# Patient Record
Sex: Female | Born: 1950
Health system: Southern US, Community
[De-identification: ages and names within clinical notes are randomized; demographics above are authoritative.]

## PROBLEM LIST (undated history)

## (undated) DIAGNOSIS — Z860101 Personal history of adenomatous and serrated colon polyps: Secondary | ICD-10-CM

## (undated) DIAGNOSIS — I10 Essential (primary) hypertension: Secondary | ICD-10-CM

## (undated) DIAGNOSIS — Z8669 Personal history of other diseases of the nervous system and sense organs: Secondary | ICD-10-CM

## (undated) DIAGNOSIS — N393 Stress incontinence (female) (male): Secondary | ICD-10-CM

## (undated) DIAGNOSIS — H04123 Dry eye syndrome of bilateral lacrimal glands: Secondary | ICD-10-CM

## (undated) DIAGNOSIS — Z0282 Encounter for adoption services: Secondary | ICD-10-CM

## (undated) DIAGNOSIS — M1990 Unspecified osteoarthritis, unspecified site: Secondary | ICD-10-CM

## (undated) DIAGNOSIS — E782 Mixed hyperlipidemia: Secondary | ICD-10-CM

## (undated) DIAGNOSIS — Z86010 Personal history of colonic polyps: Secondary | ICD-10-CM

## (undated) DIAGNOSIS — E739 Lactose intolerance, unspecified: Secondary | ICD-10-CM

## (undated) DIAGNOSIS — M545 Low back pain, unspecified: Secondary | ICD-10-CM

## (undated) DIAGNOSIS — Z789 Other specified health status: Secondary | ICD-10-CM

## (undated) DIAGNOSIS — I70213 Atherosclerosis of native arteries of extremities with intermittent claudication, bilateral legs: Secondary | ICD-10-CM

## (undated) DIAGNOSIS — R600 Localized edema: Secondary | ICD-10-CM

## (undated) DIAGNOSIS — D259 Leiomyoma of uterus, unspecified: Secondary | ICD-10-CM

## (undated) DIAGNOSIS — G4733 Obstructive sleep apnea (adult) (pediatric): Secondary | ICD-10-CM

## (undated) DIAGNOSIS — Z9989 Dependence on other enabling machines and devices: Secondary | ICD-10-CM

## (undated) DIAGNOSIS — N950 Postmenopausal bleeding: Secondary | ICD-10-CM

## (undated) DIAGNOSIS — E119 Type 2 diabetes mellitus without complications: Secondary | ICD-10-CM

## (undated) HISTORY — DX: Obstructive sleep apnea (adult) (pediatric): G47.33

## (undated) HISTORY — PX: BREAST EXCISIONAL BIOPSY: SUR124

## (undated) HISTORY — DX: Dependence on other enabling machines and devices: Z99.89

## (undated) HISTORY — PX: DERMOID CYST  EXCISION: SHX1452

## (undated) HISTORY — PX: EYE SURGERY: SHX253

## (undated) HISTORY — PX: ANKLE ARTHROSCOPY: SUR85

---

## 1991-04-24 DIAGNOSIS — Z8679 Personal history of other diseases of the circulatory system: Secondary | ICD-10-CM

## 1991-04-24 DIAGNOSIS — I341 Nonrheumatic mitral (valve) prolapse: Secondary | ICD-10-CM

## 1991-04-24 DIAGNOSIS — G4733 Obstructive sleep apnea (adult) (pediatric): Secondary | ICD-10-CM

## 1991-04-24 HISTORY — DX: Personal history of other diseases of the circulatory system: Z86.79

## 1991-04-24 HISTORY — DX: Obstructive sleep apnea (adult) (pediatric): G47.33

## 1991-04-24 HISTORY — DX: Nonrheumatic mitral (valve) prolapse: I34.1

## 1998-07-27 ENCOUNTER — Other Ambulatory Visit: Admission: RE | Admit: 1998-07-27 | Discharge: 1998-07-27 | Payer: Self-pay | Admitting: Obstetrics & Gynecology

## 1999-05-03 ENCOUNTER — Other Ambulatory Visit: Admission: RE | Admit: 1999-05-03 | Discharge: 1999-05-03 | Payer: Self-pay | Admitting: Obstetrics & Gynecology

## 1999-05-09 ENCOUNTER — Ambulatory Visit (HOSPITAL_COMMUNITY): Admission: RE | Admit: 1999-05-09 | Discharge: 1999-05-09 | Payer: Self-pay | Admitting: Obstetrics & Gynecology

## 2000-02-29 ENCOUNTER — Ambulatory Visit (HOSPITAL_BASED_OUTPATIENT_CLINIC_OR_DEPARTMENT_OTHER): Admission: RE | Admit: 2000-02-29 | Discharge: 2000-02-29 | Payer: Self-pay | Admitting: Family Medicine

## 2000-05-21 ENCOUNTER — Other Ambulatory Visit: Admission: RE | Admit: 2000-05-21 | Discharge: 2000-05-21 | Payer: Self-pay | Admitting: Obstetrics & Gynecology

## 2001-06-12 ENCOUNTER — Other Ambulatory Visit: Admission: RE | Admit: 2001-06-12 | Discharge: 2001-06-12 | Payer: Self-pay | Admitting: Obstetrics & Gynecology

## 2002-07-07 ENCOUNTER — Other Ambulatory Visit: Admission: RE | Admit: 2002-07-07 | Discharge: 2002-07-07 | Payer: Self-pay | Admitting: Obstetrics & Gynecology

## 2003-04-24 HISTORY — PX: KNEE ARTHROSCOPY: SUR90

## 2003-08-06 ENCOUNTER — Other Ambulatory Visit: Admission: RE | Admit: 2003-08-06 | Discharge: 2003-08-06 | Payer: Self-pay | Admitting: Obstetrics & Gynecology

## 2004-09-04 ENCOUNTER — Other Ambulatory Visit: Admission: RE | Admit: 2004-09-04 | Discharge: 2004-09-04 | Payer: Self-pay | Admitting: Obstetrics & Gynecology

## 2005-02-14 ENCOUNTER — Emergency Department (HOSPITAL_COMMUNITY): Admission: EM | Admit: 2005-02-14 | Discharge: 2005-02-14 | Payer: Self-pay | Admitting: Emergency Medicine

## 2011-07-25 ENCOUNTER — Ambulatory Visit: Payer: 59

## 2011-07-25 ENCOUNTER — Ambulatory Visit: Payer: 59 | Admitting: Family Medicine

## 2011-07-25 ENCOUNTER — Encounter: Payer: Self-pay | Admitting: Physician Assistant

## 2011-07-25 VITALS — BP 116/76 | HR 84 | Temp 98.4°F | Resp 16 | Ht 63.5 in | Wt 171.0 lb

## 2011-07-25 DIAGNOSIS — M25559 Pain in unspecified hip: Secondary | ICD-10-CM

## 2011-07-25 DIAGNOSIS — M461 Sacroiliitis, not elsewhere classified: Secondary | ICD-10-CM

## 2011-07-25 DIAGNOSIS — J029 Acute pharyngitis, unspecified: Secondary | ICD-10-CM

## 2011-07-25 LAB — POCT RAPID STREP A (OFFICE): Rapid Strep A Screen: NEGATIVE

## 2011-07-25 MED ORDER — PREDNISONE 20 MG PO TABS: ORAL_TABLET | ORAL | Status: AC

## 2011-07-25 NOTE — Progress Notes (Signed)
  Subjective:    Patient ID: Brittney Williamson, female    DOB: 11-02-1950, 61 y.o.   MRN: [id number]  HPI    Review of Systems     Objective:   Physical Exam        Assessment & Plan:  xr reviewed - report pending.  Agree with plan per Eula Listen, PA-C.

## 2011-07-25 NOTE — Progress Notes (Signed)
Patient ID: Brittney Williamson MRN: [id number], DOB: 13-Feb-1951, 61 y.o. Date of Encounter: 07/25/2011, 8:28 PM  Primary Physician: No primary provider on file.  Chief Complaint:  Chief Complaint  Patient presents with  . Sore Throat    also scratchy has taken sudafed pe otc    HPI: 61 y.o. year old female presents with a 1 day history of sore throat. Subjective fever and chills. No cough, congestion, rhinorrhea, sinus pressure, otalgia, or headache. Normal hearing. No GI complaints. Able to swallow saliva, but hurts to do so. Decreased appetite secondary to sore throat. Missed school today and needs a note for school. "I get strep easy."  She also mention while she is here that she would like to have her left hip looked at. She states that in January she was helping her partner's mother mom nursing homes and while she was pushing some furniture she felt a pop. Since that time she has had off and on mild pain along the left upper buttock. This pain does not radiate and is not constant. It is not sharp, just annoying. She has not seen anyone for this previously. Of note, she does regularly take Aleve for her right knee, but it does not seem to help her with her left lower back pain. She has not had any loss of bowel or bladder function. She is able to walk without difficulty, but does state that going up stairs is difficult, along with rotation of her hip causes some discomfort. She denies any numbness or tingling. No urinary or vaginal symptoms, or history of kidney stones.  No past medical history on file.   Home Meds: Prior to Admission medications   Medication Sig Start Date End Date Taking? Authorizing Provider  Cholecalciferol (VITAMIN D) 1000 UNITS capsule Take 1,000 Units by mouth daily.   Yes Historical Provider, MD  Multiple Vitamin (MULTIVITAMIN) tablet Take 1 tablet by mouth daily.   Yes Historical Provider, MD  Red Yeast Rice Extract (RED YEAST RICE PO) Take by mouth.   Yes  Historical Provider, MD    Allergies:  Allergies  Allergen Reactions  . Trimox (Amoxicillin Trihydrate) Itching    History   Social History  . Marital Status: Single    Spouse Name: Brittney Williamson    Number of Children: Brittney Williamson  . Years of Education: Brittney Williamson   Occupational History  . Not on file.   Social History Main Topics  . Smoking status: Never Smoker   . Smokeless tobacco: Not on file  . Alcohol Use: Not on file  . Drug Use: Not on file  . Sexually Active: Not on file   Other Topics Concern  . Not on file   Social History Narrative  . No narrative on file     Review of Systems: Constitutional: negative for chills, fever, night sweats or weight changes HEENT: see above Cardiovascular: negative for chest pain or palpitations Respiratory: negative for hemoptysis, wheezing, or shortness of breath Abdominal: negative for abdominal pain, nausea, vomiting or diarrhea Dermatological: negative for rash Neurologic: negative for headache   Physical Exam: Blood pressure 116/76, pulse 84, temperature 98.4 F (36.9 C), temperature source Oral, resp. rate 16, height 5' 3.5" (1.613 m), weight 171 lb (77.565 kg)., Body mass index is 29.82 kg/(m^2). General: Well developed, well nourished, in no acute distress. Head: Normocephalic, atraumatic, eyes without discharge, sclera non-icteric, nares are patent. Bilateral auditory canals clear, TM's are without perforation, pearly grey with reflective cone of light bilaterally. No  sinus TTP. Oral cavity moist, dentition normal. Posterior pharynx with post nasal drip and mild erythema. No peritonsillar abscess or tonsillar exudate. Neck: Supple. No thyromegaly. Full ROM. No lymphadenopathy. Lungs: Clear bilaterally to auscultation without wheezes, rales, or rhonchi. Breathing is unlabored. Heart: RRR with S1 S2. No murmurs, rubs, or gallops appreciated. Msk:  Strength and tone normal for age. Left lower back mildly TTP along upper buttock. FROM of left  leg. Does have some discomfort with internal and external rotation of the left hip. SLR negative both seated and supine. DTR 2+ bilaterally. No midline TTP.  Extremities: No clubbing or cyanosis. No edema. Neuro: Alert and oriented X 3. Moves all extremities spontaneously. CNII-XII grossly in tact. Psych:  Responds to questions appropriately with a normal affect.   Labs: Results for orders placed in visit on 07/25/11  POCT RAPID STREP A (OFFICE)      Component Value Range   Rapid Strep A Screen Negative  Negative    TC pending  [facility] reading (PRIMARY) by  Dr. Neva Seat. Slight degenerative changes bilateral femoral acetabular joints Slight degenerative changes left great than right SI joint   ASSESSMENT AND PLAN:  61 y.o. year old female with pharyngitis and sacroiliitis 1. Pharyngitis -See below for Prednisone dosing -Await TC results -Tylenol/Motrin prn -Rest/fluids -RTC precautions -RTC 3-5 days if no improvement  2. Sacroiliitis  -Prednisone 20 mg #12 3x2, 2x2, 1x2 no RF -Await over read -Call if no better in 2 weeks, consider ortho referral vs MRI -Rest  Signed, Eula Listen, PA-C 07/25/2011 8:28 PM

## 2011-07-27 LAB — CULTURE, GROUP A STREP: Organism ID, Bacteria: NORMAL

## 2011-07-27 NOTE — Progress Notes (Signed)
Quick Note:  Await final results.  Eula Listen, PA-C 07/27/2011 10:57 AM ______

## 2011-08-08 ENCOUNTER — Telehealth: Payer: Self-pay

## 2011-08-08 NOTE — Telephone Encounter (Signed)
LMOM to cb

## 2011-08-08 NOTE — Telephone Encounter (Signed)
Ok, please draft note and forward to patient.  Brittney Williamson

## 2011-08-08 NOTE — Telephone Encounter (Signed)
Pt states that her teacher is requiring her to have a note for missing class on Monday August 06, 2011 due to allergies and a headache, pt would like to know if we are able write this note for her by 2:00pm today.

## 2011-08-10 NOTE — Telephone Encounter (Signed)
Spoke with patient and let her know that letter would be at front desk.

## 2011-12-13 ENCOUNTER — Telehealth: Payer: Self-pay

## 2011-12-13 DIAGNOSIS — G4730 Sleep apnea, unspecified: Secondary | ICD-10-CM

## 2011-12-13 NOTE — Telephone Encounter (Signed)
I have called her back and she wants to repeat her sleep study, has been some time since it was done/ I am pulling chart. She also wants old information from previous sleep study faxed to [facility] I told her I will fax this. The # [phone]

## 2011-12-13 NOTE — Telephone Encounter (Signed)
PT STATES SHE NEED DR DAUB TO WRITE A PRESCRIPTION FOR HER SUPPLIES/PRODUCTS THAT GOES TO [facility], ALSO NEED TO HAVE THE LAST READING OF HER C-PAP MACHINE SENT TO [facility] FROM [facility] IN [place] YOU MAY REACH PT AT [phone]

## 2011-12-14 NOTE — Telephone Encounter (Signed)
I have put order in for her supplies. Please sign, also she wants new sleep study, has been several years. Please advise. Chart is in your box.

## 2011-12-16 NOTE — Telephone Encounter (Signed)
Please put in an order for e sleep study split protocol with Dr. Vickey Huger

## 2011-12-17 ENCOUNTER — Telehealth: Payer: Self-pay

## 2011-12-17 NOTE — Telephone Encounter (Signed)
Pt states she asked recently for [place] to fax multiple things to [facility] for her cpap needs. States sophie from [facility] states they did not receive anything. They need an rx for sleep study and for various pieces of equipment with her cpap machine. Also most recent report of cpap readings (pt states from a few years ago).   Fax for [facility]: [phone] attn: sophie  Call pt with questions

## 2011-12-17 NOTE — Telephone Encounter (Signed)
Order made

## 2011-12-17 NOTE — Telephone Encounter (Signed)
Has signed all the forms I can see. I sent them back to the team leader.

## 2011-12-17 NOTE — Telephone Encounter (Signed)
Forms faxed to [facility], along with sleep study from 2007.  Per prior message, new study ordered.

## 2011-12-17 NOTE — Telephone Encounter (Signed)
Dr. Cleta Alberts, did you sign this for patient?  I ordered the sleep study.Marland KitchenMarland Kitchen

## 2012-03-06 ENCOUNTER — Telehealth: Payer: Self-pay

## 2012-03-06 NOTE — Telephone Encounter (Signed)
I do not have the most recent sleep study. Called patient. Left message for her to call back about this.

## 2012-03-06 NOTE — Telephone Encounter (Signed)
It was done Nov. 4th. She is not sure where it was done. She will call me back and let me know so I can try to get results.

## 2012-03-06 NOTE — Telephone Encounter (Signed)
Patient request results from sleep study to be faxed to [facility] at [phone]. Call patient when done at #[phone]

## 2012-03-07 ENCOUNTER — Telehealth: Payer: Self-pay

## 2012-03-07 NOTE — Telephone Encounter (Signed)
PATIENT CALLED WANTING TO SPEAK TO AMY ABOUT SLEEP STUDY TEST AND RESULTS. PLEASE CALL BACK IMMEDIATELY TO CLARIFY. THANK YOU!

## 2012-03-07 NOTE — Telephone Encounter (Signed)
This was done at [facility] sleep study lab. The phone # [phone]. Needs titration faxed to me so I can send to the [facility] for supplies.

## 2012-03-07 NOTE — Telephone Encounter (Signed)
I called [facility] and the report is not in yet. The sleep center advised they will send the titration to [facility]. I called patient and she is advised.

## 2012-09-23 ENCOUNTER — Ambulatory Visit (INDEPENDENT_AMBULATORY_CARE_PROVIDER_SITE_OTHER): Payer: BC Managed Care – PPO | Admitting: Family Medicine

## 2012-09-23 VITALS — BP 130/72 | HR 73 | Temp 98.2°F | Resp 16 | Ht 63.5 in | Wt 158.0 lb

## 2012-09-23 DIAGNOSIS — L089 Local infection of the skin and subcutaneous tissue, unspecified: Secondary | ICD-10-CM

## 2012-09-23 MED ORDER — DOXYCYCLINE HYCLATE 100 MG PO CAPS: 100.0000 mg | ORAL_CAPSULE | Freq: Two times a day (BID) | ORAL | Status: DC

## 2012-09-23 MED ORDER — ACYCLOVIR 400 MG PO TABS: 400.0000 mg | ORAL_TABLET | Freq: Three times a day (TID) | ORAL | Status: DC

## 2012-09-23 NOTE — Progress Notes (Signed)
Chief complaint: Possible shingles  History of present illness: Patient is a 62 year old female coming in with a outbreak on her bottom of the last 3-5 days. Patient states it is starting one area and seems to progress somewhat. Patient states that it is minimally tender and is having some discharge. Patient denies any fevers or chills or any but by recently. Patient has been able to do all her activities of daily living. Patient denies that it is significantly sore to touch. Patient denies any abnormal weight loss or any history of cancers.  No past medical history on file. No past surgical history on file. History  Substance Use Topics  . Smoking status: Never Smoker   . Smokeless tobacco: Not on file  . Alcohol Use: Not on file   No family history on file. History   Social History  . Marital Status: Single    Spouse Name: N/A    Number of Children: N/A  . Years of Education: N/A   Social History Main Topics  . Smoking status: Never Smoker   . Smokeless tobacco: None  . Alcohol Use: None  . Drug Use: None  . Sexually Active: None   Other Topics Concern  . None   Social History Narrative  . None   Physical exam Blood pressure 130/72, pulse 73, temperature 98.2 F (36.8 C), resp. rate 16, height 5' 3.5" (1.613 m), weight 158 lb (71.668 kg), SpO2 100.00%. General: No apparent distress alert and oriented x3 Cardiovascular: Regular rate and rhythm no murmur Pulmonary: Clear to auscultation bilaterally Skin exam: On patient's left bottom she does have a coalition of multiple vesicles numbering 4 with some mild serosanguineous discharge. His does have some erythema surrounding the area. This is minimally tender to palpation. This is not following a dermatome.  Assessment: Skin infection questionable spider bite my concern for MRSA  Plan: Doxy x 10 days Acyclovir in case it spreads RTC if fever or worsening pain See patient instructions.

## 2012-09-23 NOTE — Patient Instructions (Signed)
Very nice to meet you I think this may be a superficial infection.  I am giving you doxycycline to try for next 10 days.  If it starts to spread start the acyclovir I am giving you.  If you get fever or chills or it gets much worse in pain please come back and may need to get it drained. I do not think it is necessary today.

## 2013-10-29 ENCOUNTER — Telehealth: Payer: Self-pay | Admitting: *Deleted

## 2013-10-29 NOTE — Telephone Encounter (Signed)
Lenora Gomes has requested that we contact Dr. Everlene Farrier for him to consider taking her as a new/old patient.  Per the patient, he has been her family doctor for years, as well as her parents.   Please let us know if we need to schedule her an appt for a CPE.  She can be reached at [phone].     Thank you, Loura Back

## 2013-11-02 NOTE — Telephone Encounter (Signed)
Dr Everlene Farrier is not taking new patients by appointment, she can see him at the urgent care. Called her to advise left message to call back for his hours.

## 2015-04-24 HISTORY — PX: WRIST SURGERY: SHX841

## 2015-06-22 HISTORY — PX: ORIF WRIST FRACTURE: SHX2133

## 2015-08-01 ENCOUNTER — Telehealth: Payer: Self-pay

## 2015-08-01 NOTE — Telephone Encounter (Signed)
Waiting on payment of $31.25 for 51 pages. From [facility]

## 2015-08-15 NOTE — Telephone Encounter (Signed)
Payment received on 08/15/15 and records were faxed.

## 2015-09-07 DIAGNOSIS — Z0271 Encounter for disability determination: Secondary | ICD-10-CM

## 2015-11-17 ENCOUNTER — Ambulatory Visit (INDEPENDENT_AMBULATORY_CARE_PROVIDER_SITE_OTHER): Payer: BLUE CROSS/BLUE SHIELD | Admitting: Physician Assistant

## 2015-11-17 VITALS — BP 142/94 | HR 92 | Temp 98.9°F | Resp 18 | Ht 63.0 in | Wt 169.0 lb

## 2015-11-17 DIAGNOSIS — Z1329 Encounter for screening for other suspected endocrine disorder: Secondary | ICD-10-CM

## 2015-11-17 DIAGNOSIS — M25631 Stiffness of right wrist, not elsewhere classified: Secondary | ICD-10-CM

## 2015-11-17 DIAGNOSIS — Z0000 Encounter for general adult medical examination without abnormal findings: Secondary | ICD-10-CM

## 2015-11-17 DIAGNOSIS — Z9289 Personal history of other medical treatment: Secondary | ICD-10-CM

## 2015-11-17 DIAGNOSIS — Z13220 Encounter for screening for lipoid disorders: Secondary | ICD-10-CM | POA: Diagnosis not present

## 2015-11-17 DIAGNOSIS — S52613D Displaced fracture of unspecified ulna styloid process, subsequent encounter for closed fracture with routine healing: Secondary | ICD-10-CM | POA: Diagnosis not present

## 2015-11-17 DIAGNOSIS — Z13228 Encounter for screening for other metabolic disorders: Secondary | ICD-10-CM | POA: Diagnosis not present

## 2015-11-17 DIAGNOSIS — M25641 Stiffness of right hand, not elsewhere classified: Secondary | ICD-10-CM

## 2015-11-17 DIAGNOSIS — S6291XA Unspecified fracture of right wrist and hand, initial encounter for closed fracture: Secondary | ICD-10-CM

## 2015-11-17 DIAGNOSIS — Z130 Encounter for screening for diseases of the blood and blood-forming organs and certain disorders involving the immune mechanism: Secondary | ICD-10-CM

## 2015-11-17 MED ORDER — PREDNISONE 20 MG PO TABS: ORAL_TABLET | ORAL | 0 refills | Status: DC

## 2015-11-17 MED ORDER — CETIRIZINE HCL 10 MG PO TABS: 10.0000 mg | ORAL_TABLET | Freq: Every day | ORAL | 1 refills | Status: DC

## 2015-11-17 MED ORDER — TRIAMCINOLONE ACETONIDE 0.1 % EX CREA: 1.0000 "application " | TOPICAL_CREAM | Freq: Two times a day (BID) | CUTANEOUS | 0 refills | Status: DC

## 2015-11-17 NOTE — Patient Instructions (Addendum)
IF you received an x-ray today, you will receive an invoice from [facility]. Please contact [facility] at [phone] with questions or concerns regarding your invoice.   IF you received labwork today, you will receive an invoice from [employer]. Please contact [facility] at [phone] with questions or concerns regarding your invoice.   Our billing staff will not be able to assist you with questions regarding bills from these companies.  You will be contacted with the lab results as soon as they are available. The fastest way to get your results is to activate your My Chart account. Instructions are located on the last page of this paperwork. If you have not heard from [place] regarding the results in 2 weeks, please contact this office.    I will contact you with information of the stool DNA with your lab work. Sounds like you are doing the right things.  Please let me know if there is no improvement of your symptoms in the next 5-7 days.  Keeping You Healthy  Get These Tests  Blood Pressure- Have your blood pressure checked by your healthcare provider at least once a year.  Normal blood pressure is 120/80.  Weight- Have your body mass index (BMI) calculated to screen for obesity.  BMI is a measure of body fat based on height and weight.  You can calculate your own BMI at [employer].ch  Cholesterol- Have your cholesterol checked every year.  Diabetes- Have your blood sugar checked every year if you have high blood pressure, high cholesterol, a family history of diabetes or if you are overweight.  Pap Test - Have a pap test every 1 to 5 years if you have been sexually active.  If you are older than 65 and recent pap tests have been normal you may not need additional pap tests.  In addition, if you have had a hysterectomy  for benign disease additional pap tests are not necessary.  Mammogram-Yearly mammograms are essential for  early detection of breast cancer  Screening for Colon Cancer- Colonoscopy starting at age 80. Screening may begin sooner depending on your family history and other health conditions.  Follow up colonoscopy as directed by your Gastroenterologist.  Screening for Osteoporosis- Screening begins at age 32 with bone density scanning, sooner if you are at higher risk for developing Osteoporosis.  Get these medicines  Calcium with Vitamin D- Your body requires 1200-1500 mg of Calcium a day and [phone] IU of Vitamin D a day.  You can only absorb 500 mg of Calcium at a time therefore Calcium must be taken in 2 or 3 separate doses throughout the day.  Hormones- Hormone therapy has been associated with increased risk for certain cancers and heart disease.  Talk to your healthcare provider about if you need relief from menopausal symptoms.  Aspirin- Ask your healthcare provider about taking Aspirin to prevent Heart Disease and Stroke.  Get these Immunizations  Flu shot- Every fall  Pneumonia shot- Once after the age of 25; if you are younger ask your healthcare provider if you need a pneumonia shot.  Tetanus- Every ten years.  Zostavax- Once after the age of 28 to prevent shingles.  Take these steps  Don't smoke- Your healthcare provider can help you quit. For tips on how to quit, ask your healthcare provider or go to www.smokefree.gov or call 1-800 QUIT-NOW.  Be physically active- Exercise 5 days a week for a minimum of 30 minutes.  If you are  not already physically active, start slow and gradually work up to 30 minutes of moderate physical activity.  Try walking, dancing, bike riding, swimming, etc.  Eat a healthy diet- Eat a variety of healthy foods such as fruits, vegetables, whole grains, low fat milk, low fat cheeses, yogurt, lean meats, chicken, fish, eggs, dried beans, tofu, etc.  For more information go to www.thenutritionsource.org  Dental visit- Brush and floss teeth twice daily; visit  your dentist twice a year.  Eye exam- Visit your Optometrist or Ophthalmologist yearly.  Drink alcohol in moderation- Limit alcohol intake to one drink or less a day.  Never drink and drive.  Depression- Your emotional health is as important as your physical health.  If you're feeling down or losing interest in things you normally enjoy, please talk to your healthcare provider.  Seat Belts- can save your life; always wear one  Smoke/Carbon Monoxide detectors- These detectors need to be installed on the appropriate level of your home.  Replace batteries at least once a year.  Violence- If anyone is threatening or hurting you, please tell your healthcare provider.  Living Will/ Health care power of attorney- Discuss with your healthcare provider and family.

## 2015-11-17 NOTE — Progress Notes (Signed)
[facility] [address] [place] [address] [phone]  Date:  11/17/2015   Name:  Brittney Williamson   DOB:  02-Aug-1950   MRN:  [id number]  PCP:  No PCP Per Patient    History of Present Illness: Patient is here today for an annual physical exam, wrist pain, and rash.  Patient has concern of new rash, onset 2 days ago.  Rash is at the left side adjacent to ear.  Very pruritic.  No vision changes.  She was out in yard ripping out weeds just prior to [place].  She has a cat. She tried witch hazel, and placed hydrocortisone topically which helps with it.   Titanium plated wrist following a fracture of wrist 4 months ago.  Continues to have lack of full strength.  She was followed by a physical therapy and hand surgeon in [place] now lives in [place] and needs follow up care.     Diet: everything.  herbalife shakes.  No fried foods.  Eats chicken and fish.  She eats a lot of sweet potatoes.  Salads.  Water intake: 6 bottles per day.  No sodas  BM: normal.  No blood in stool or black stool.    Urination: normal.  No dysuria, hematuria--she has some frequency that has progressively changed chronically.    Sleep: terrible 2 hours with waking, not using cpap.    Social Activity: stainglass, love to go to [place], and camping, travel, art fairs, and concerts.   EtOH:  1-2 per week (glasses) Tobacco or vaping: no  Illicit drug use: no Sexually active: no  There are no active problems to display for this patient.   No past medical history on file.  No past surgical history on file.  Social History  Substance Use Topics  . Smoking status: Never Smoker  . Smokeless tobacco: Not on file  . Alcohol use Not on file    No family history on file.  Allergies  Allergen Reactions  . Trimox [Amoxicillin Trihydrate] Itching    Medication list has been reviewed and updated.  Current Outpatient Prescriptions on File Prior to Visit  Medication Sig Dispense  Refill  . Cholecalciferol (VITAMIN D) 1000 UNITS capsule Take 1,000 Units by mouth daily.    . Multiple Vitamin (MULTIVITAMIN) tablet Take 1 tablet by mouth daily.    . Red Yeast Rice Extract (RED YEAST RICE PO) Take by mouth.    Marland Kitchen UNABLE TO FIND Herbal life shakes    . acyclovir (ZOVIRAX) 400 MG tablet Take 1 tablet (400 mg total) by mouth 3 (three) times daily. (Patient not taking: Reported on 11/17/2015) 15 tablet 0   No current facility-administered medications on file prior to visit.     Review of Systems  Constitutional: Negative for chills and fever.  HENT: Negative for ear discharge, ear pain and sore throat.   Eyes: Negative for blurred vision and double vision.  Respiratory: Negative for cough, shortness of breath and wheezing.   Cardiovascular: Negative for chest pain, palpitations and leg swelling.  Gastrointestinal: Negative for diarrhea, nausea and vomiting.  Genitourinary: Negative for dysuria, frequency and hematuria.  Musculoskeletal: Positive for joint pain (secondary to fall/wrist surgery).  Skin: Positive for rash. Negative for itching.  Neurological: Negative for dizziness and headaches.     Physical Examination: BP (!) 142/94 (BP Location: Left Arm, Patient Position: Sitting, Cuff Size: Normal)   Pulse 92   Temp 98.9 F (37.2 C) (Oral)  Resp 18   Ht 5' 3.5" (1.613 m)   Wt 162 lb (73.5 kg)   SpO2 95%   BMI 28.25 kg/m  Ideal Body Weight: Weight in (lb) to have BMI = 25: 143.1  Physical Exam  Musculoskeletal:       Right wrist: She exhibits decreased range of motion and tenderness. She exhibits no effusion.  Decreased strength 3/5.     Visual Acuity Screening   Right eye Left eye Both eyes  Without correction: 20/20-1 20/13-1 20/13-1  With correction:       Assessment and Plan: Brittney Williamson is a 65 y.o. female who is here today for annual physical exam and wrist pain. Continued treatment of her hand pain is necessary at this time.  Referring  to hand surgeon for follow up, and physical therapy at this time. Pending labs.  Annual physical exam - Plan: CBC, COMPLETE METABOLIC PANEL WITH GFR, Lipid panel, TSH  Screening for deficiency anemia - Plan: CBC  Screening for metabolic disorder - Plan: COMPLETE METABOLIC PANEL WITH GFR  Screening for lipid disorders - Plan: Lipid panel  Screening for thyroid disorder - Plan: TSH  Hand fracture, right, closed, initial encounter - Plan: Ambulatory referral to Hand Surgery, Ambulatory referral to Physical Therapy  Stiffness of right hand joint - Plan: Ambulatory referral to Hand Surgery, Ambulatory referral to Physical Therapy  Stiffness of right wrist joint - Plan: Ambulatory referral to Hand Surgery, Ambulatory referral to Physical Therapy  Ulna styloid fracture, closed, unspecified laterality, with routine healing, subsequent encounter - Plan: Ambulatory referral to Hand Surgery, Ambulatory referral to Physical Therapy  History of EKG - Plan: EKG 12-Lead   Ivar Drape, PA-C [facility] 11/17/2015 5:04 PM

## 2015-11-18 ENCOUNTER — Other Ambulatory Visit (INDEPENDENT_AMBULATORY_CARE_PROVIDER_SITE_OTHER): Payer: BLUE CROSS/BLUE SHIELD | Admitting: Physician Assistant

## 2015-11-18 DIAGNOSIS — Z13220 Encounter for screening for lipoid disorders: Secondary | ICD-10-CM

## 2015-11-18 DIAGNOSIS — Z0000 Encounter for general adult medical examination without abnormal findings: Secondary | ICD-10-CM

## 2015-11-18 DIAGNOSIS — Z130 Encounter for screening for diseases of the blood and blood-forming organs and certain disorders involving the immune mechanism: Secondary | ICD-10-CM

## 2015-11-18 DIAGNOSIS — Z1329 Encounter for screening for other suspected endocrine disorder: Secondary | ICD-10-CM

## 2015-11-18 DIAGNOSIS — Z13228 Encounter for screening for other metabolic disorders: Secondary | ICD-10-CM

## 2015-11-18 LAB — COMPLETE METABOLIC PANEL WITH GFR
ALT: 19 U/L (ref 6–29)
AST: 22 U/L (ref 10–35)
Albumin: 4.1 g/dL (ref 3.6–5.1)
Alkaline Phosphatase: 83 U/L (ref 33–130)
BUN: 15 mg/dL (ref 7–25)
CO2: 29 mmol/L (ref 20–31)
Calcium: 9.5 mg/dL (ref 8.6–10.4)
Chloride: 105 mmol/L (ref 98–110)
Creat: 0.8 mg/dL (ref 0.50–0.99)
GFR, Est African American: >89 mL/min (ref >=60)
GFR, Est Non African American: 78 mL/min (ref >=60)
Glucose, Bld: 88 mg/dL (ref 65–99)
Potassium: 4.6 mmol/L (ref 3.5–5.3)
Sodium: 142 mmol/L (ref 135–146)
Total Bilirubin: 0.5 mg/dL (ref 0.2–1.2)
Total Protein: 6.8 g/dL (ref 6.1–8.1)

## 2015-11-18 LAB — LIPID PANEL
Cholesterol: 331 mg/dL — ABNORMAL HIGH (ref 125–200)
HDL: 40 mg/dL — ABNORMAL LOW (ref >=46)
Total CHOL/HDL Ratio: 8.3 Ratio — ABNORMAL HIGH (ref <=5.0)
Triglycerides: 429 mg/dL — ABNORMAL HIGH (ref <150)

## 2015-11-18 LAB — CBC
HCT: 44 % (ref 35.0–45.0)
Hemoglobin: 14.3 g/dL (ref 11.7–15.5)
MCH: 26.8 pg — ABNORMAL LOW (ref 27.0–33.0)
MCHC: 32.5 g/dL (ref 32.0–36.0)
MCV: 82.6 fL (ref 80.0–100.0)
MPV: 8.8 fL (ref 7.5–12.5)
Platelets: 239 10*3/uL (ref 140–400)
RBC: 5.33 MIL/uL — ABNORMAL HIGH (ref 3.80–5.10)
RDW: 16.1 % — ABNORMAL HIGH (ref 11.0–15.0)
WBC: 5.4 10*3/uL (ref 3.8–10.8)

## 2015-11-18 LAB — TSH: TSH: 1.13 mIU/L

## 2015-11-23 ENCOUNTER — Telehealth: Payer: Self-pay

## 2015-11-23 NOTE — Telephone Encounter (Signed)
PATIENT STATES SHE SAW STEPHANIE ENGLISH ON Friday TO HAVE A BLOOD PROFILE DONE. SHE WOULD LIKE TO GET THE RESULTS PLEASE. ALSO, SHE SAID STEPHANIE TOLD HER TO KEEP A CHECK ON HER BLOOD PRESSURE. ON TUES. MORNING 8/1/ 2017 SHE HAD IT CHECKED AT [facility] AND IT WAS 190/110. SHE WILL HAVE IT CHECKED THIS AFTERNOON WHEN SHE GOES TO THE BLOOD CENTER. SHE WILL PROBABLY CALL BACK TODAY AFTER 4:00 TO GET AN APPOINTMENT FOR Thursday TO BE SEEN. SHEKETIA SAID SHE COULD COME IN JUST TO HAVE HER BLOOD PRESSURE TAKEN, BUT IF IT IS HIGH SHE WILL HAVE TO SEE A DOCTOR.  BEST PHONE [phone] (CELL) [place]

## 2015-11-23 NOTE — Telephone Encounter (Signed)
Brittney Williamson stated she spoke with pt about this and she is supposed to be coming in.

## 2015-11-28 ENCOUNTER — Telehealth: Payer: Self-pay

## 2015-11-28 NOTE — Telephone Encounter (Signed)
Labs are normal without anemia, kidney or liver function. Thyroid function is normal.  Lipid panel has elevated cholesterol and triglycerides.  I assumed you fasted as we discussed.  Let's do an omega fatty acid that you can obtain over the counter.  I would like you to return in 6-8 weeks for recheck.  If it shows no improvement, we should start a statin.  Did you say that you are adverse to using a statin?  The other thing we can do is refer you to a lipid clinic for consult.  They likely could offer another medicine, and having them prescribe it may insure coverage.  We do need to get this triglyceride down.

## 2015-11-28 NOTE — Telephone Encounter (Signed)
Patient stated the steroid for her poison ivy isn't working. The poison ivy is getting worse. Spreading all over. Patient is also using a topical cream. Cream soothes the itching. Patient stated her blood pressure is high. 151/91. [phone]. Patient is concerned.

## 2015-11-29 ENCOUNTER — Telehealth: Payer: Self-pay | Admitting: *Deleted

## 2015-11-29 ENCOUNTER — Encounter: Payer: Self-pay | Admitting: *Deleted

## 2015-11-29 NOTE — Telephone Encounter (Signed)
Patient was given results 

## 2015-11-30 ENCOUNTER — Ambulatory Visit (INDEPENDENT_AMBULATORY_CARE_PROVIDER_SITE_OTHER): Payer: BLUE CROSS/BLUE SHIELD | Admitting: Physician Assistant

## 2015-11-30 ENCOUNTER — Encounter: Payer: Self-pay | Admitting: Physician Assistant

## 2015-11-30 VITALS — BP 132/84 | HR 99 | Temp 98.4°F | Resp 17 | Ht 63.0 in | Wt 169.0 lb

## 2015-11-30 DIAGNOSIS — I10 Essential (primary) hypertension: Secondary | ICD-10-CM

## 2015-11-30 DIAGNOSIS — E782 Mixed hyperlipidemia: Secondary | ICD-10-CM | POA: Insufficient documentation

## 2015-11-30 DIAGNOSIS — L237 Allergic contact dermatitis due to plants, except food: Secondary | ICD-10-CM | POA: Diagnosis not present

## 2015-11-30 DIAGNOSIS — E785 Hyperlipidemia, unspecified: Secondary | ICD-10-CM

## 2015-11-30 MED ORDER — PREDNISONE 20 MG PO TABS: ORAL_TABLET | ORAL | 0 refills | Status: DC

## 2015-11-30 MED ORDER — TRIAMCINOLONE ACETONIDE 0.1 % EX CREA: 1.0000 "application " | TOPICAL_CREAM | Freq: Two times a day (BID) | CUTANEOUS | 0 refills | Status: DC

## 2015-11-30 NOTE — Progress Notes (Signed)
[facility] [address], [place] [address] [phone]  Date:  11/30/2015   Name:  Brittney Williamson   DOB:  Feb 08, 1951   MRN:  [id number]  PCP:  No PCP Per Patient    Chief Complaint: Hypertension and Rash (Left flank, spreading)   History of Present Illness:  This is a 65 y.o. female with PMH HTN and allergic rhinitis who is presenting with HTN and rash.   Pt was seen here on 7/28 with poison ivy dermatitis. She was prescribed kenalog cream and 7 day prednisone taper. She took last dose 2 days ago and rash returned yesterday all over trunk and arms. She states "I'm on fire". She has been using kenalog which is only helping some. She is out of the kenalog now.  Pt has elevated BP. When last seen on 7/28 reading was 175/100. Today 142/94 and then decreased to 132/84. States she bought a monitor at the drug store and BP was 200/100 yesterday morning. Was 150/80 last night. She is asymptomatic. She does not want to take medication, she states "I'm not a pill popper".  She had blood work last visit with normal CBC, TSH and CMP. Cholesterol quite elevated - total 331, triglycerides 429, HDL 40 and LDL unable to be calculated. She states her triglycerides have been in the 500s before. She is starting back on the red yeast rice. She states she will not take statins, states "I've read too much about them". She wants to try to reduce with diet and exercise.  Review of Systems:  Review of Systems See HPI  There are no active problems to display for this patient.   Prior to Admission medications   Medication Sig Start Date End Date Taking? Authorizing Provider  cetirizine (ZYRTEC) 10 MG tablet Take 1 tablet (10 mg total) by mouth daily. 11/17/15  Yes Dorian Heckle English, PA  Cholecalciferol (VITAMIN D) 1000 UNITS capsule Take 1,000 Units by mouth daily.   Yes Historical Provider, MD  Multiple Vitamin (MULTIVITAMIN) tablet Take 1 tablet by mouth daily.   Yes Historical  Provider, MD  Red Yeast Rice Extract (RED YEAST RICE PO) Take by mouth.   Yes Historical Provider, MD  triamcinolone cream (KENALOG) 0.1 % Apply 1 application topically 2 (two) times daily. 11/17/15  Yes Dorian Heckle English, PA  UNABLE TO FIND Herbal life shakes   Yes Historical Provider, MD                  Allergies  Allergen Reactions  . Trimox [Amoxicillin Trihydrate] Itching    History reviewed. No pertinent surgical history.  Social History  Substance Use Topics  . Smoking status: Never Smoker  . Smokeless tobacco: Not on file  . Alcohol use Not on file    History reviewed. No pertinent family history.  Medication list has been reviewed and updated.  Physical Examination:  Physical Exam  Constitutional: She is oriented to person, place, and time. She appears well-developed and well-nourished. No distress.  HENT:  Head: Normocephalic and atraumatic.  Right Ear: Hearing normal.  Left Ear: Hearing normal.  Nose: Nose normal.  Eyes: Conjunctivae and lids are normal. Right eye exhibits no discharge. Left eye exhibits no discharge. No scleral icterus.  Neck: Carotid bruit is not present.  Cardiovascular: Normal rate, regular rhythm, normal heart sounds and normal pulses.   No murmur heard. Pulmonary/Chest: Effort normal and breath sounds normal. No respiratory distress. She has no wheezes. She has  no rhonchi. She has no rales.  Musculoskeletal: Normal range of motion.  Neurological: She is alert and oriented to person, place, and time.  Skin: Skin is warm, dry and intact.  Maculopapular erythema distributed in linear formation over trunk and bilateral upper arms.  Psychiatric: She has a normal mood and affect. Her speech is normal and behavior is normal. Thought content normal.   BP 132/84 (BP Location: Left Arm, Patient Position: Sitting, Cuff Size: Normal)   Pulse 99   Temp 98.4 F (36.9 C) (Oral)   Resp 17   Ht 5\' 3"  (1.6 m)   Wt 169 lb (76.7 kg)   SpO2 95%    BMI 29.94 kg/m   Assessment and Plan:  1. Poison ivy dermatitis - predniSONE (DELTASONE) 20 MG tablet; Take 2 PO QAM x3days, 1 PO QAM x2days, then stop.  Dispense: 8 tablet; Refill: 0 - triamcinolone cream (KENALOG) 0.1 %; Apply 1 application topically 2 (two) times daily.  Dispense: 80 g; Refill: 0  2. Essential hypertension Pt does not want to take medication. I have advised she take BP a few times a day and keep a record. If in a month BP is consistently >150/90, return for further discussion.  3. Hyperlipidemia Cholesterol markedly elevated. I explained her CV risk. Pt refuses to take a statin. She wants to continue red yeast rice and start fish oil, work on regular exercise and follow mediterranean diet. Return in 6 months for recheck.   Benjaman Pott Drenda Freeze, MHS [facility]  11/30/2015

## 2015-11-30 NOTE — Patient Instructions (Addendum)
Monitor BP daily, make sure you are relaxed x 15 minutes and sitting before taking. Take with your arm resting at heart level. Keep record. Return in 1 month if BP consistently >150/90.  Take red yeast rice and fish oil to help with your cholesterol. Exercise most days of the week for at least 30 minutes at a time. Follow mediterranean diet. Get your cholesterol rechecked in 6 months.    IF you received an x-ray today, you will receive an invoice from [facility]. Please contact [facility] at [phone] with questions or concerns regarding your invoice.   IF you received labwork today, you will receive an invoice from [employer]. Please contact [facility] at [phone] with questions or concerns regarding your invoice.   Our billing staff will not be able to assist you with questions regarding bills from these companies.  You will be contacted with the lab results as soon as they are available. The fastest way to get your results is to activate your My Chart account. Instructions are located on the last page of this paperwork. If you have not heard from [place] regarding the results in 2 weeks, please contact this office.     We recommend that you schedule a mammogram for breast cancer screening. Typically, you do not need a referral to do this. Please contact a local imaging center to schedule your mammogram.  [facility] - [phone]  *ask for the Radiology Department [facility] - [phone] or [phone]  [facility] - [phone] [place] [phone] [facility] - [phone]  *ask for the [facility] - [phone]  *ask for the Radiology Department [facility] - [phone]  *ask for the Home - [phone]

## 2015-12-15 ENCOUNTER — Telehealth: Payer: Self-pay

## 2015-12-15 DIAGNOSIS — G4730 Sleep apnea, unspecified: Secondary | ICD-10-CM

## 2015-12-15 DIAGNOSIS — Z789 Other specified health status: Secondary | ICD-10-CM

## 2015-12-15 NOTE — Telephone Encounter (Signed)
Can we send

## 2015-12-15 NOTE — Telephone Encounter (Signed)
The patient called to request a referral for Dr Dohmeier at [facility].  She wants to get a follow up sleep study to make sure her CPAP machine is still working appropriately for her.  CB#: [phone]

## 2015-12-16 NOTE — Telephone Encounter (Signed)
Absolutely.  Sleep study sent.  She did report in cpe that she currently was not using it, so return would be good.  Alert patient.  Also ask when is the last time she had a sleep study, and if she is actually using it

## 2015-12-16 NOTE — Telephone Encounter (Signed)
Left message for pt to call back  °

## 2015-12-19 NOTE — Telephone Encounter (Signed)
Spoke with pt, she uses the cpap every night and her last sleep study was 4 years ago. She sleeps pretty good but wakes up every four hours.

## 2015-12-20 NOTE — Telephone Encounter (Signed)
Ok thanks for the info.

## 2015-12-23 ENCOUNTER — Encounter: Payer: Self-pay | Admitting: Family Medicine

## 2016-01-10 ENCOUNTER — Other Ambulatory Visit: Payer: Self-pay | Admitting: Physician Assistant

## 2016-01-10 DIAGNOSIS — M25631 Stiffness of right wrist, not elsewhere classified: Secondary | ICD-10-CM

## 2016-01-10 DIAGNOSIS — S6291XA Unspecified fracture of right wrist and hand, initial encounter for closed fracture: Secondary | ICD-10-CM

## 2016-01-12 ENCOUNTER — Encounter: Payer: Self-pay | Admitting: Neurology

## 2016-01-12 ENCOUNTER — Ambulatory Visit (INDEPENDENT_AMBULATORY_CARE_PROVIDER_SITE_OTHER): Payer: BLUE CROSS/BLUE SHIELD | Admitting: Neurology

## 2016-01-12 VITALS — BP 160/88 | HR 76 | Resp 20 | Ht 63.0 in | Wt 172.0 lb

## 2016-01-12 DIAGNOSIS — G4700 Insomnia, unspecified: Secondary | ICD-10-CM

## 2016-01-12 DIAGNOSIS — Z9989 Dependence on other enabling machines and devices: Secondary | ICD-10-CM | POA: Insufficient documentation

## 2016-01-12 DIAGNOSIS — G4730 Sleep apnea, unspecified: Secondary | ICD-10-CM

## 2016-01-12 DIAGNOSIS — G4733 Obstructive sleep apnea (adult) (pediatric): Secondary | ICD-10-CM | POA: Diagnosis not present
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
## 2016-02-13 ENCOUNTER — Ambulatory Visit (INDEPENDENT_AMBULATORY_CARE_PROVIDER_SITE_OTHER): Payer: BLUE CROSS/BLUE SHIELD | Admitting: Neurology

## 2016-02-13 DIAGNOSIS — G4730 Sleep apnea, unspecified: Secondary | ICD-10-CM

## 2016-02-13 DIAGNOSIS — G4700 Insomnia, unspecified: Secondary | ICD-10-CM

## 2016-02-13 DIAGNOSIS — Z9989 Dependence on other enabling machines and devices: Secondary | ICD-10-CM

## 2016-02-13 DIAGNOSIS — G4733 Obstructive sleep apnea (adult) (pediatric): Secondary | ICD-10-CM

## 2016-02-17 DIAGNOSIS — Z9989 Dependence on other enabling machines and devices: Secondary | ICD-10-CM

## 2016-02-17 DIAGNOSIS — G4733 Obstructive sleep apnea (adult) (pediatric): Secondary | ICD-10-CM

## 2016-02-21 ENCOUNTER — Telehealth: Payer: Self-pay | Admitting: Neurology

## 2016-02-21 DIAGNOSIS — Z9989 Dependence on other enabling machines and devices: Secondary | ICD-10-CM

## 2016-02-21 DIAGNOSIS — G4733 Obstructive sleep apnea (adult) (pediatric): Secondary | ICD-10-CM

## 2016-02-21 NOTE — Telephone Encounter (Signed)
Please call Mrs Bieler with Split study results. CD

## 2016-02-21 NOTE — Telephone Encounter (Signed)
I spent greater than 10 minutes on the phone with this pt. I advised her that Dr. Brett Fairy reviewed her sleep study and saw that pt had severe osa and PLMs with low arousal index. Dr. Brett Fairy recommends pt to start a cpap at 9 cm H2O. I advised pt to avoid sedative-hypnotics which may worsen sleep apnea, alcohol and tobacco.    Pt says that she already has a cpap and wants the order sent to [facility]. A follow up appt was made for 05/02/16 at 9:30am. Pt verbalized understanding of results.   Pt says that she had a terrible night's sleep in the sleep lab. Her back is still in pain from that night. The beds were too soft, per pt. Pt recommends getting new mattresses. Pt is actually going to have to go to Urgent Care today because her back hurts so bad following the night of her sleep study. I advised pt that I would make Robin, our sleep lab manager, aware of this recommendation. Pt verbalized understanding.

## 2016-02-21 NOTE — Progress Notes (Signed)
PATIENT'S NAME:  Brittney Williamson, Brittney Williamson DOB:      06/19/1950      MR#:    [id number]     DATE OF RECORDING: 02/13/2016 REFERRING M.D.:  Ivar Drape, [place] Study Performed:  Split-Night Titration Study HISTORY:  Patient underwent a split-night polysomnography in 2013, per Dr Everlene Farrier.  Had 49 obstructive apneas and 43 hypopneas, with a total AHI of 38.2. Oxygen nadir was at 64% for a total desaturation time of 66 minutes. Given this constellation, she was titrated to CPAP at a  pressure of 5 cm water. In the meantime she continued to use an Auto CPAP, but her residual AHI was 5.8, and she reports getting less sleep. Here for re-titration, evaluation of insomnia on CPAP.   The patient endorsed the Epworth Sleepiness Scale at 8/24 points and the Fatigue Score at 30 points.    The patient's weight 172 pounds with a height of 63 (inches), resulting in a BMI of 30.5 kg/m2.The patient's neck circumference measured 14 inches.  CURRENT MEDICATIONS: Cholecalciferol, Multi-Vitamin and Red yeast extract    PROCEDURE:  This is a multichannel digital polysomnogram utilizing the Somnostar 11.2 system.  Electrodes and sensors were applied and monitored per AASM Specifications.   EEG, EOG, Chin and Limb EMG, were sampled at 200 Hz.  ECG, Snore and Nasal Pressure, Thermal Airflow, Respiratory Effort, CPAP Flow and Pressure, Oximetry was sampled at 50 Hz. Digital video and audio were recorded.      BASELINE STUDY WITHOUT CPAP RESULTS:  Lights Out was at 20:58 and Lights On at 05:06.  Total recording time (TRT) was 238.5, with a total sleep time (TST) of 159 minutes.   The patient's sleep latency was 65 minutes.  REM latency was 145.5 minutes.  The sleep efficiency was 66.7 %.   WASO (Wake after sleep onset) was 41.5 minutes, Stage N1 was 16.5 minutes, Stage N2 was 122.5 minutes, Stage N3 was 0 minutes, and Stage R (REM sleep) was 20 minutes.  The percentages were Stage N1 10.4%, Stage N2 77.0%, Stage N3 0.0% and Stage R (REM  sleep) 12.6%.   RESPIRATORY ANALYSIS:  There were a total of 121 respiratory events:  7 obstructive apneas, 0 central apneas 114 hypopneas with 0 respiratory event related arousals (RERAs).  Snoring was noted.     The total APNEA/HYPOPNEA INDEX (AHI) was 45.7 /hour and the total RESPIRATORY DISTURBANCE INDEX was 45.7 /hour.  20 events occurred in REM sleep and 192 events in NREM. The REM AHI was 60, /hour versus a non-REM AHI of 43.6 /hour. The patient spent 311 minutes sleep time in the supine position and 87 minutes in non-supine. The supine AHI was 57.1 /hour versus a non-supine AHI of 36.1 /hour.  OXYGEN SATURATION & C02:  The wake baseline 02 saturation was 97%, with the lowest being 65%. Time spent below 89% saturation equaled 31 minutes.   PERIODIC LIMB MOVEMENTS: (Baseline)   The patient had a total of 178 Periodic Limb Movements.  The Periodic Limb Movement (PLM) index was 67.2 /hour and the PLM Arousal index was 1.1 /hour.   TITRATION STUDY WITH CPAP RESULTS:   CPAP was initiated at 5 cmH20 with heated humidity per AASM split night standards and pressure was advanced to 9 cmH20 because of hypopneas, apneas and desaturations.  At a PAP pressure of 9 cmH20, there was a reduction of the AHI to 0.0 /hour.   Total recording time (TRT) was 250 minutes, with a total sleep time (TST) of  238.5 minutes. The patient's sleep latency was 1.5 minutes. REM latency was 56.5 minutes.  The sleep efficiency was 95.4 %.    SLEEP ARCHITECTURE: Wake after sleep was 11.5 minutes, Stage N1 6 minutes, Stage N2 146.5 minutes, Stage N3 34 minutes and Stage R (REM sleep) 52 minutes. The percentages were: Stage N1 2.5%, Stage N2 61.4%, Stage N3 14.3% and Stage R (REM sleep) 21.8%.   RESPIRATORY ANALYSIS:  There were a total of 5 respiratory events: 0 obstructive apneas, 0 central apneas and 0 mixed apneas with a total of 0 apneas and an apnea index (AI) of 0. There were 5 hypopneas with a hypopnea index of 1.3  /hour. The patient also had 0 respiratory event related arousals (RERAs).      The total APNEA/HYPOPNEA INDEX (AHI) was 1.3 /hour and the total RESPIRATORY DISTURBANCE INDEX was 1.3 /hour.  1 event occurred in REM sleep and 4 events in NREM. The REM AHI was 1.2 /hour versus a non-REM AHI of 1.3 /hour. REM sleep was first achieved on a pressure of 7 cm/H20. The patient spent 100% of total sleep time in the supine position.  OXYGEN SATURATION & C02:  The wake baseline 02 saturation was 93%, with the lowest being 88%. Time spent below 89% saturation equaled 0.9 minutes.  PERIODIC LIMB MOVEMENTS: (Baseline)   The patient had a total of 315 Periodic Limb Movements. The Periodic Limb Movement (PLM) index was 79.2 /hour and the PLM Arousal index was 0.8 /hour.    POLYSOMNOGRAPHY IMPRESSION :   1. Severe Obstructive Sleep Apnea  2. Periodic Limb Movement of sleep of  unknown clinical significance as not associated with elevated arousal index     RECOMMENDATIONS:  1. Advise to start CPAP at 9 cmH2O .   2. An Airfit P10 by [employer] in medium size was used with heated humidity during this study.  Advise to add heated humidity.  Adjust interface and heated humidity as needed.     3. Avoid sedative-hypnotics which may worsen sleep apnea, and alcohol and tobacco (as applicable). 4. Further information regarding OSA may be obtained from [employer] (www.sleepfoundation.org) or American Sleep Apnea Association (www.sleepapnea.org). 5. Consider dedicated sleep psychology referral if insomnia is of clinical concern.   6. A follow up appointment will be scheduled in the Sleep Clinic at [facility].      I certify that I have reviewed the entire raw data recording prior to the issuance of this report in accordance with the Standards of Accreditation of the American Academy of Sleep Medicine (AASM)      Larey Seat, M.D. 02-21-2016 Diplomat, American Board of  Psychiatry and Neurology  Diplomat, American Board of Sleep Medicine Medical Director, [facility] [place], [place]

## 2016-02-21 NOTE — Telephone Encounter (Signed)
I called pt to discuss sleep study results. No answer, left a message asking her to call me back. 

## 2016-02-23 ENCOUNTER — Telehealth: Payer: Self-pay | Admitting: Family Medicine

## 2016-02-23 ENCOUNTER — Telehealth: Payer: Self-pay

## 2016-02-23 ENCOUNTER — Other Ambulatory Visit: Payer: Self-pay | Admitting: Physician Assistant

## 2016-02-23 ENCOUNTER — Telehealth: Payer: Self-pay | Admitting: Emergency Medicine

## 2016-02-23 DIAGNOSIS — Z8781 Personal history of (healed) traumatic fracture: Secondary | ICD-10-CM

## 2016-02-23 DIAGNOSIS — Z13820 Encounter for screening for osteoporosis: Secondary | ICD-10-CM

## 2016-02-23 DIAGNOSIS — G4733 Obstructive sleep apnea (adult) (pediatric): Secondary | ICD-10-CM

## 2016-02-23 NOTE — Telephone Encounter (Signed)
Sent referral note to order for [facility]

## 2016-02-23 NOTE — Telephone Encounter (Signed)
I can not reorder and cancel this for some reason.  Please send the dexa scan to [facility].

## 2016-02-23 NOTE — Telephone Encounter (Signed)
Pt would like a Bone density test done please respond

## 2016-02-23 NOTE — Telephone Encounter (Signed)
This patient states that she does not need a new cpap, all she needs is to be adjust the one that she already has She received a call from [place] [facility] in [place] telling her that she was set up for a new cpap.  Patient request you to call her at [phone] on Monday 02/27/16

## 2016-02-23 NOTE — Telephone Encounter (Signed)
Pt requesting bone density test be performed at [facility]. Please change referral

## 2016-02-23 NOTE — Telephone Encounter (Signed)
I have placed the order.  This is generally 65 and up, or risk of fractures.  You had one, but by discussion--it was legitimate fracture and did not sound secondary to possible fracture risk.  I will proceed with going ahead and testing.

## 2016-02-23 NOTE — Telephone Encounter (Signed)
The surgeon suggested she get testing done.

## 2016-02-27 NOTE — Telephone Encounter (Signed)
I spoke to pt. She says that she already has a cpap that is only 64 years old and wants that cpap set to 9 cm H2O. She reports that [facility] called her and told her that she had to pick up her new cpap in [facility] and she doesn't understand why she can't take her cpap to the [facility] store to have it changed. She wants to have her CPAP changed today because she is off of work. I advised her that I do not recommend driving to [facility] today because [facility] requires appts for cpap changes. Pt wants me to call [facility] to find out if she can take her cpap today.  I called [facility] and advised them that pt may use her current cpap as long as it is able to be downloaded in the office. Brittney Williamson at [facility] reports that pt will need an appt to change the cpap pressure and that Brittney Williamson will call her and that she does not need to go to the [facility].

## 2016-05-02 ENCOUNTER — Ambulatory Visit: Payer: Self-pay | Admitting: Neurology

## 2016-05-02 ENCOUNTER — Encounter: Payer: Self-pay | Admitting: Nurse Practitioner

## 2016-05-02 ENCOUNTER — Telehealth: Payer: Self-pay

## 2016-05-02 ENCOUNTER — Ambulatory Visit (INDEPENDENT_AMBULATORY_CARE_PROVIDER_SITE_OTHER): Payer: Medicare Other | Admitting: Nurse Practitioner

## 2016-05-02 VITALS — BP 158/96 | HR 76 | Ht 63.0 in | Wt 174.2 lb

## 2016-05-02 DIAGNOSIS — G4700 Insomnia, unspecified: Secondary | ICD-10-CM | POA: Diagnosis not present

## 2016-05-02 DIAGNOSIS — G4730 Sleep apnea, unspecified: Secondary | ICD-10-CM | POA: Diagnosis not present

## 2016-05-02 DIAGNOSIS — Z9989 Dependence on other enabling machines and devices: Secondary | ICD-10-CM | POA: Diagnosis not present

## 2016-05-02 DIAGNOSIS — G4733 Obstructive sleep apnea (adult) (pediatric): Secondary | ICD-10-CM

## 2016-05-02 NOTE — Progress Notes (Signed)
[facility]  PATIENT: Brittney Williamson DOB: [birth year]   REASON FOR VISIT: Follow-up for first CPAP compliance HISTORY FROM: Patient    HISTORY OF PRESENT ILLNESS: Brittney Williamson, 66 year old female returns for CPAP compliance after having a split-night titration sleep study on 02/13/2016. Her study shows severe obstructive sleep apnea and periodic limb movements of sleep unknown  clinical significance. She was placed on CPAP at 9 cm. She is advised to avoid sedative hypnotics which may worsen her sleep apnea also alcohol and tobacco. Her compliance download today is greater than 4 hours 63% for 19 days. She claims she was on vacation in [place] for over a week. In addition sometimes when she goes to bed she is too tired and forgets to use the appliance. Average usage for days used 5 hours 53 minutes at 9 cm of pressure EPR level 3. AHI for 0.4 she returns for reevaluation   REVIEW OF SYSTEMS: Full 14 system review of systems performed and notable only for those listed, all others are neg:  Constitutional: neg  Cardiovascular: neg Ear/Nose/Throat: neg  Skin: neg Eyes: neg Respiratory: neg Gastroitestinal: neg  Hematology/Lymphatic: neg  Endocrine: neg Musculoskeletal:neg Allergy/Immunology: neg Neurological: neg Psychiatric: neg Sleep : Obstructive sleep apnea with CPAP   ALLERGIES: Allergies  Allergen Reactions  . Penicillins Itching  . Trimox [Amoxicillin Trihydrate] Itching    HOME MEDICATIONS: Outpatient Medications Prior to Visit  Medication Sig Dispense Refill  . Cholecalciferol (VITAMIN D) 1000 UNITS capsule Take 1,000 Units by mouth daily.    . Multiple Vitamin (MULTIVITAMIN) tablet Take 1 tablet by mouth daily.    Marland Kitchen UNABLE TO FIND Herbal life shakes    . Red Yeast Rice Extract (RED YEAST RICE PO) Take by mouth.     No facility-administered medications prior to visit.     PAST MEDICAL HISTORY: History reviewed. No pertinent past medical  history.  PAST SURGICAL HISTORY: History reviewed. No pertinent surgical history.  FAMILY HISTORY: History reviewed. No pertinent family history.  SOCIAL HISTORY: Social History   Social History  . Marital status: Single    Spouse name: N/A  . Number of children: N/A  . Years of education: N/A   Occupational History  . Not on file.   Social History Main Topics  . Smoking status: Never Smoker  . Smokeless tobacco: Never Used  . Alcohol use Not on file  . Drug use: Unknown  . Sexual activity: Not on file   Other Topics Concern  . Not on file   Social History Narrative  . No narrative on file     PHYSICAL EXAM  Vitals:   05/02/16 0949  BP: (!) 158/96  Pulse: 76  Weight: 174 lb 3.2 oz (79 kg)  Height: 5\' 3"  (1.6 m)   Body mass index is 30.86 kg/m.  Generalized: Well developed, Obese female in no acute distress  Head: normocephalic and atraumatic,. Oropharynx benign  Neck: Supple, no carotid bruits  Cardiac: Regular rate rhythm, no murmur  Musculoskeletal: No deformity   Neurological examination   Mentation: Alert oriented to time, place, history taking. Attention span and concentration appropriate. Recent and remote memory intact.  Follows all commands speech and language fluent. ESS 7. FSS 25 Cranial nerve II-XII: .Pupils were equal round reactive to light extraocular movements were full, visual field were full on confrontational test. Facial sensation and strength were normal. hearing was intact to finger rubbing bilaterally. Uvula tongue midline. head turning and shoulder shrug were normal and symmetric.Tongue  protrusion into cheek strength was normal. Motor: normal bulk and tone, full strength in the BUE, BLE, fine finger movements normal, no pronator drift. No focal weakness Sensory: normal and symmetric to light touch, pinprick, and  Vibration, in the upper and lower extremities Coordination: finger-nose-finger, heel-to-shin bilaterally, no  dysmetria Reflexes: Brachioradialis 2/2, biceps 2/2, triceps 2/2, patellar 2/2, Achilles 2/2, plantar responses were flexor bilaterally. Gait and Station: Rising up from seated position without assistance, normal stance,  moderate stride, good arm swing, smooth turning, able to perform tiptoe, and heel walking without difficulty. Tandem gait is steady  DIAGNOSTIC DATA (LABS, IMAGING, TESTING) - I reviewed patient records, labs, notes, testing and imaging myself where available.  Lab Results  Component Value Date   WBC 5.4 11/18/2015   HGB 14.3 11/18/2015   HCT 44.0 11/18/2015   MCV 82.6 11/18/2015   PLT 239 11/18/2015      Component Value Date/Time   NA 142 11/18/2015 0811   K 4.6 11/18/2015 0811   CL 105 11/18/2015 0811   CO2 29 11/18/2015 0811   GLUCOSE 88 11/18/2015 0811   BUN 15 11/18/2015 0811   CREATININE 0.80 11/18/2015 0811   CALCIUM 9.5 11/18/2015 0811   PROT 6.8 11/18/2015 0811   ALBUMIN 4.1 11/18/2015 0811   AST 22 11/18/2015 0811   ALT 19 11/18/2015 0811   ALKPHOS 83 11/18/2015 0811   BILITOT 0.5 11/18/2015 0811   GFRNONAA 78 11/18/2015 0811   GFRAA >89 11/18/2015 0811   Lab Results  Component Value Date   CHOL 331 (H) 11/18/2015   HDL 40 (L) 11/18/2015   LDLCALC NOT CALC 11/18/2015   TRIG 429 (H) 11/18/2015   CHOLHDL 8.3 (H) 11/18/2015   No results found for: HGBA1C No results found for: VITAMINB12 Lab Results  Component Value Date   TSH 1.13 11/18/2015      ASSESSMENT AND PLAN  66 y.o. year old female  to follow-up for obstructive sleep apnea and first compliance for her CPAP. Her CPAP usage greater than 4 hours is 63%. Average usage for days used 5 hours 53 minutes at 9 cm of pressure EPR level 3. AHI for 0.4 The patient is a current patient of Dr. Brett Fairy who is out of the office today . This note is sent to the work in doctor.     CPAP compliance 63% I explained in particular the risks and ramifications of noncompliance with her  severe OSA,  especially with respect to cardiovascular disease  including congestive heart failure, difficult to treat hypertension, cardiac arrhythmias, or stroke. Even type 2 diabetes has, in part, been linked to untreated OSA. Symptoms of untreated OSA include daytime sleepiness, memory problems, mood irritability and mood disorder such as depression and anxiety, lack of energy, as well as recurrent headaches, especially morning headaches. We talked about trying to maintain a healthy lifestyle in general, as well as the importance of weight control. I encouraged the patient to eat healthy, exercise daily and keep well hydrated, to keep a scheduled bedtime and wake time routine, to not skip any meals and eat healthy snacks in between meals Please follow up in 2 months to recheck compliance Greater than 50% of time during this 25 minute visit was spent on counseling, reviewing reviewing sleep study with patient explanation of diagnosis, planning of further management, discussion with patient , coordination of care Dennie Bible, [facility], [facility], [facility] [address], [place], [place] [address] [phone]

## 2016-05-02 NOTE — Telephone Encounter (Signed)
I called pt. Dr. Brett Fairy is out of the office today sick and therefore her appt with Dr. Brett Fairy will need to be rescheduled. Pt is agreeable to seeing the NP today for her cpap and Hoyle Sauer, NP is also agreeable to this. Pt knows to arrive by 9:30am for a 9:45 appt. Pt verbalized understanding of new appt time.

## 2016-05-02 NOTE — Patient Instructions (Signed)
CPAP compliance 63% Please follow up in 2 months to recheck compliance

## 2016-05-15 DIAGNOSIS — R200 Anesthesia of skin: Secondary | ICD-10-CM | POA: Diagnosis not present

## 2016-05-15 DIAGNOSIS — M25641 Stiffness of right hand, not elsewhere classified: Secondary | ICD-10-CM | POA: Diagnosis not present

## 2016-05-24 DIAGNOSIS — H2513 Age-related nuclear cataract, bilateral: Secondary | ICD-10-CM | POA: Diagnosis not present

## 2016-06-05 ENCOUNTER — Telehealth: Payer: Self-pay | Admitting: Neurology

## 2016-06-05 NOTE — Telephone Encounter (Signed)
I sent [facility] with [facility] a message to see what we need to send in for the patient.

## 2016-06-05 NOTE — Telephone Encounter (Signed)
Pt called back to check on status of request. I advised her RN sent msg to [facility]. She also advised her insurance is now Medicare effective 04/23/16.

## 2016-06-05 NOTE — Telephone Encounter (Signed)
Patient requesting sleep results sent to [place] so she can receive replacement parts for her CPAP.

## 2016-06-06 NOTE — Telephone Encounter (Signed)
I received a message back from [facility]:  Shelby Dubin, RN; Leeroy Bock        Cornelious Bryant,  It appears due to pt changing insurance we were needing her F2F office visit notes. I have pulled these from last month and sent into our resupply team.   Thanks.  Angie

## 2016-06-06 NOTE — Progress Notes (Signed)
I reviewed note and agree with plan.   Shaneque Merkle R. Analina Filla, MD  Certified in Neurology, Neurophysiology and Neuroimaging  [facility] [address], [address] [place], [place] [address] [phone]   

## 2016-06-28 DIAGNOSIS — Z1231 Encounter for screening mammogram for malignant neoplasm of breast: Secondary | ICD-10-CM | POA: Diagnosis not present

## 2016-07-01 ENCOUNTER — Encounter: Payer: Self-pay | Admitting: Nurse Practitioner

## 2016-07-02 ENCOUNTER — Ambulatory Visit (INDEPENDENT_AMBULATORY_CARE_PROVIDER_SITE_OTHER): Payer: Medicare Other | Admitting: Nurse Practitioner

## 2016-07-02 ENCOUNTER — Encounter (INDEPENDENT_AMBULATORY_CARE_PROVIDER_SITE_OTHER): Payer: Self-pay

## 2016-07-02 ENCOUNTER — Encounter: Payer: Self-pay | Admitting: Nurse Practitioner

## 2016-07-02 VITALS — BP 143/84 | HR 72 | Resp 16 | Ht 63.0 in | Wt 176.0 lb

## 2016-07-02 DIAGNOSIS — G4730 Sleep apnea, unspecified: Secondary | ICD-10-CM | POA: Diagnosis not present

## 2016-07-02 DIAGNOSIS — G4733 Obstructive sleep apnea (adult) (pediatric): Secondary | ICD-10-CM | POA: Diagnosis not present

## 2016-07-02 DIAGNOSIS — G4700 Insomnia, unspecified: Secondary | ICD-10-CM

## 2016-07-02 DIAGNOSIS — Z9989 Dependence on other enabling machines and devices: Secondary | ICD-10-CM | POA: Diagnosis not present

## 2016-07-02 NOTE — Patient Instructions (Addendum)
CPAP compliance 83% for 25 days Please use CPAP every night Continue CPAP at current settings  F/U in 6 months

## 2016-07-02 NOTE — Progress Notes (Signed)
[facility]  PATIENT: Brittney Williamson DOB: Jul 15, 1950   REASON FOR VISIT: Follow-up for  CPAP compliance HISTORY FROM: Patient    HISTORY OF PRESENT ILLNESS: Brittney Williamson, 66 year old female returns for CPAP compliance after having a split-night titration sleep study on 02/13/2016. Her last compliance report greater than 4 hours 63% for 19 days. Her compliance today is 25 out of 30 days 83% compliance greater than 4 hours compliance 67%.   Her sleep  shows severe obstructive sleep apnea and periodic limb movements of sleep unknown  clinical significance. She is on  CPAP at 9 cm. She is advised to avoid sedative hypnotics which may worsen her sleep apnea also alcohol and tobacco. She claims  today that  sometimes when she goes to bed she is too tired and forgets to use the appliance. She denies having any illnesses but she could not use.Average usage for days used 5 hours 23 minutes at 9 cm of pressure EPR level 3. AHI for 3.1.  She returns for reevaluation   REVIEW OF SYSTEMS: Full 14 system review of systems performed and notable only for those listed, all others are neg:  Constitutional: Fatigue Cardiovascular: neg Ear/Nose/Throat: neg  Skin: neg Eyes: neg Respiratory: neg Gastroitestinal: neg  Hematology/Lymphatic: neg  Endocrine: neg Musculoskeletal:neg Allergy/Immunology: neg Neurological: neg Psychiatric: neg Sleep : Obstructive sleep apnea with CPAP   ALLERGIES: Allergies  Allergen Reactions  . Penicillins Itching  . Trimox [Amoxicillin Trihydrate] Itching    HOME MEDICATIONS: Outpatient Medications Prior to Visit  Medication Sig Dispense Refill  . B Complex Vitamins (B COMPLEX 1 PO) Take by mouth.    . Cholecalciferol (VITAMIN D) 1000 UNITS capsule Take 1,000 Units by mouth daily.    . Multiple Vitamin (MULTIVITAMIN) tablet Take 1 tablet by mouth daily.    Marland Kitchen UNABLE TO FIND Herbal life shakes    . vitamin C (ASCORBIC ACID) 500 MG tablet Take  500 mg by mouth daily.    Marland Kitchen OVER THE COUNTER MEDICATION b complex gummies  (2 daily)     No facility-administered medications prior to visit.     PAST MEDICAL HISTORY: No past medical history on file.  PAST SURGICAL HISTORY: No past surgical history on file.  FAMILY HISTORY: No family history on file.  SOCIAL HISTORY: Social History   Social History  . Marital status: Single    Spouse name: N/A  . Number of children: N/A  . Years of education: N/A   Occupational History  . Not on file.   Social History Main Topics  . Smoking status: Never Smoker  . Smokeless tobacco: Never Used  . Alcohol use Not on file  . Drug use: Unknown  . Sexual activity: Not on file   Other Topics Concern  . Not on file   Social History Narrative  . No narrative on file     PHYSICAL EXAM  Vitals:   07/02/16 0754  BP: (!) 143/84  Pulse: 72  Resp: 16  Weight: 176 lb (79.8 kg)  Height: 5\' 3"  (1.6 m)   Body mass index is 31.18 kg/m.  Generalized: Well developed, Obese female in no acute distress  Head: normocephalic and atraumatic,. Oropharynx benign  Neck: Supple, no carotid bruits  Musculoskeletal: No deformity   Neurological examination   Mentation: Alert oriented to time, place, history taking. Attention span and concentration appropriate. Recent and remote memory intact.  Follows all commands speech and language fluent.  Cranial nerve II-XII: .Pupils were equal round  reactive to light extraocular movements were full, visual field were full on confrontational test. Facial sensation and strength were normal. hearing was intact to finger rubbing bilaterally. Uvula tongue midline. head turning and shoulder shrug were normal and symmetric.Tongue protrusion into cheek strength was normal. Motor: normal bulk and tone, full strength in the BUE, BLE, fine finger movements normal, no pronator drift. No focal weakness Sensory: normal and symmetric to light touch, pinprick, and  Vibration,  in the upper and lower extremities Coordination: finger-nose-finger, heel-to-shin bilaterally, no dysmetria Reflexes: Symmetric upper and lower, plantar responses were flexor bilaterally. Gait and Station: Rising up from seated position without assistance, normal stance,  moderate stride, good arm swing, smooth turning, able to perform tiptoe, and heel walking without difficulty. Tandem gait is steady  DIAGNOSTIC DATA (LABS, IMAGING, TESTING) - I reviewed patient records, labs, notes, testing and imaging myself where available.  Lab Results  Component Value Date   WBC 5.4 11/18/2015   HGB 14.3 11/18/2015   HCT 44.0 11/18/2015   MCV 82.6 11/18/2015   PLT 239 11/18/2015      Component Value Date/Time   NA 142 11/18/2015 0811   K 4.6 11/18/2015 0811   CL 105 11/18/2015 0811   CO2 29 11/18/2015 0811   GLUCOSE 88 11/18/2015 0811   BUN 15 11/18/2015 0811   CREATININE 0.80 11/18/2015 0811   CALCIUM 9.5 11/18/2015 0811   PROT 6.8 11/18/2015 0811   ALBUMIN 4.1 11/18/2015 0811   AST 22 11/18/2015 0811   ALT 19 11/18/2015 0811   ALKPHOS 83 11/18/2015 0811   BILITOT 0.5 11/18/2015 0811   GFRNONAA 78 11/18/2015 0811   GFRAA >89 11/18/2015 0811   Lab Results  Component Value Date   CHOL 331 (H) 11/18/2015   HDL 40 (L) 11/18/2015   LDLCALC NOT CALC 11/18/2015   TRIG 429 (H) 11/18/2015   CHOLHDL 8.3 (H) 11/18/2015    Lab Results  Component Value Date   TSH 1.13 11/18/2015      ASSESSMENT AND PLAN  66 y.o. year old female  to follow-up for obstructive sleep apnea  compliance for her CPAP. Her CPAP usage greater than 4 hours is 67%. Average usage for days used 5 hours 23 minutes at 9 cm of pressure EPR level 3. AHI for 3.1     CPAP compliance 83% for 25 days.  Continue CPAP at current settings  F/U in 6 months I explained in particular the risks and ramifications of noncompliance with her  severe OSA, especially with respect to cardiovascular disease  including congestive  heart failure, difficult to treat hypertension, cardiac arrhythmias, or stroke. Even type 2 diabetes has, in part, been linked to untreated OSA. Symptoms of untreated OSA include daytime sleepiness, memory problems, mood irritability and mood disorder such as depression and anxiety, lack of energy, as well as recurrent headaches, especially morning headaches. Greater than 50% of time during this 15 minute visit was spent on counseling, reviewing reviewing sleep compliance  with patient  planning of further management,  Dennie Bible, [facility], [facility], APRN  [facility] [address] [place], [place] [address] [phone]

## 2016-07-02 NOTE — Progress Notes (Signed)
I agree with the assessment and plan as directed by NP .The patient is known to me .   Paddy Neis, MD  

## 2016-07-04 DIAGNOSIS — Z124 Encounter for screening for malignant neoplasm of cervix: Secondary | ICD-10-CM | POA: Diagnosis not present

## 2016-07-04 DIAGNOSIS — N760 Acute vaginitis: Secondary | ICD-10-CM | POA: Diagnosis not present

## 2016-07-05 DIAGNOSIS — R922 Inconclusive mammogram: Secondary | ICD-10-CM | POA: Diagnosis not present

## 2016-09-05 ENCOUNTER — Telehealth: Payer: Self-pay | Admitting: Neurology

## 2016-09-05 ENCOUNTER — Encounter (HOSPITAL_COMMUNITY): Payer: Self-pay | Admitting: *Deleted

## 2016-09-05 ENCOUNTER — Emergency Department (HOSPITAL_COMMUNITY)
Admission: EM | Admit: 2016-09-05 | Discharge: 2016-09-05 | Disposition: A | Discharge status: Home / Self Care | Payer: Medicare Other | Attending: Emergency Medicine | Admitting: Emergency Medicine

## 2016-09-05 ENCOUNTER — Emergency Department (HOSPITAL_COMMUNITY): Payer: Medicare Other

## 2016-09-05 ENCOUNTER — Ambulatory Visit: Payer: Medicare Other | Admitting: Emergency Medicine

## 2016-09-05 DIAGNOSIS — R0789 Other chest pain: Secondary | ICD-10-CM | POA: Diagnosis not present

## 2016-09-05 DIAGNOSIS — I10 Essential (primary) hypertension: Secondary | ICD-10-CM | POA: Diagnosis not present

## 2016-09-05 DIAGNOSIS — Z79899 Other long term (current) drug therapy: Secondary | ICD-10-CM | POA: Diagnosis not present

## 2016-09-05 DIAGNOSIS — R079 Chest pain, unspecified: Secondary | ICD-10-CM

## 2016-09-05 LAB — I-STAT TROPONIN, ED
Troponin i, poc: 0 ng/mL (ref 0.00–0.08)
Troponin i, poc: 0 ng/mL (ref 0.00–0.08)

## 2016-09-05 LAB — CBC
HCT: 45.4 % (ref 36.0–46.0)
Hemoglobin: 14.7 g/dL (ref 12.0–15.0)
MCH: 27.6 pg (ref 26.0–34.0)
MCHC: 32.4 g/dL (ref 30.0–36.0)
MCV: 85.2 fL (ref 78.0–100.0)
Platelets: 231 10*3/uL (ref 150–400)
RBC: 5.33 MIL/uL — ABNORMAL HIGH (ref 3.87–5.11)
RDW: 15.1 % (ref 11.5–15.5)
WBC: 6.3 10*3/uL (ref 4.0–10.5)

## 2016-09-05 LAB — BASIC METABOLIC PANEL
Anion gap: 8 (ref 5–15)
BUN: 12 mg/dL (ref 6–20)
CO2: 28 mmol/L (ref 22–32)
Calcium: 9.7 mg/dL (ref 8.9–10.3)
Chloride: 105 mmol/L (ref 101–111)
Creatinine, Ser: 0.77 mg/dL (ref 0.44–1.00)
GFR calc Af Amer: >60 mL/min (ref >60)
GFR calc non Af Amer: >60 mL/min (ref >60)
Glucose, Bld: 99 mg/dL (ref 65–99)
Potassium: 4.2 mmol/L (ref 3.5–5.1)
Sodium: 141 mmol/L (ref 135–145)

## 2016-09-05 LAB — D-DIMER, QUANTITATIVE: D-Dimer, Quant: 0.43 ug/mL-FEU (ref 0.00–0.50)

## 2016-09-05 NOTE — Telephone Encounter (Signed)
Important is that we recommended ED evaluation, it is her choice to go to urgent care. Brittney Williamson

## 2016-09-05 NOTE — Telephone Encounter (Signed)
Hospital / ED visit for evaluation needed. CD

## 2016-09-05 NOTE — Telephone Encounter (Signed)
Pt refused ED visit and was going to call Urgent Care on [address] as my documentation reflects.

## 2016-09-05 NOTE — Progress Notes (Deleted)
Pt advised to go directly to ER for evaluation of chest pain per Dr. Mitchel Honour Pt verbalized understanding

## 2016-09-05 NOTE — Telephone Encounter (Signed)
Pt called said she has chest pain w/cpap upper left side.  She said she has tightness in chest now.  She wants to know if she should come in to see [facility] or go to the hospital

## 2016-09-05 NOTE — ED Triage Notes (Signed)
Pt reports onset Friday of left side chest pains. Denies sob. Pt appears in no acute distress at triage. Reports recent bp fluctuations.

## 2016-09-05 NOTE — Telephone Encounter (Signed)
I called pt. She is currently having "chest tightness" and has woken up the past 3 nights with chest pain. She has been using her cpap. I advised her that chest pain needs to be evaluated by the ER to make sure that she is not having a heart attack. Pt says that she does not want to go to the ER because this is not an urgent need. I explained to her that chest pain is an urgent concern, and that heart attacks often present differently in women, and to rule out a heart attack, she will most likely need an EKG and some blood work. Pt says that she has a history of mitral valve prolapse and this could be contributing to her chest pain. Pt says that she would rather call Urgent Care on [address] to get an EKG done and not go to the ER. Pt says that she promises me that she will seek treatment ASAP, even if she decides against our advice of going to the ER. I did advise her that Dr. Brett Fairy was messaged as we were speaking and I advised her that pt was having chest pain and tightness, and Dr. Brett Fairy agreed that pt should go to ER. Pt says again that she is at work and would rather go to urgent care but says that she will call her urgent care office.

## 2016-09-07 NOTE — ED Provider Notes (Signed)
[facility] DEPT Provider Note   CSN: [id number] Arrival date & time: 09/05/16  1600     History   Chief Complaint Chief Complaint  Patient presents with  . Chest Pain    HPI Brittney Williamson is a 66 y.o. female.   Chest Pain   This is a new problem. The current episode started more than 1 week ago. The problem occurs daily. The problem has been resolved. Associated with: sleep. The pain is present in the substernal region. The pain is moderate. The quality of the pain is described as sharp. The symptoms are aggravated by deep breathing.    History reviewed. No pertinent past medical history.  Patient Active Problem List   Diagnosis Date Noted  . OSA on CPAP 01/12/2016  . Insomnia with sleep apnea 01/12/2016  . Essential hypertension 11/30/2015  . Hyperlipidemia 11/30/2015    History reviewed. No pertinent surgical history.  OB History    No data available       Home Medications    Prior to Admission medications   Medication Sig Start Date End Date Taking? Authorizing Provider  B Complex Vitamins (B COMPLEX 1 PO) Take 1 tablet by mouth daily.    Yes [provider]  Cholecalciferol (VITAMIN D) 1000 UNITS capsule Take 1,000 Units by mouth daily.   Yes [provider]  Melatonin 5 MG TABS Take 5 mg by mouth at bedtime.    Yes [provider]  Multiple Vitamin (MULTIVITAMIN) tablet Take 1 tablet by mouth daily.   Yes [provider]  naproxen sodium (ANAPROX) 220 MG tablet Take 220 mg by mouth 2 (two) times daily as needed (for pain).   Yes [provider]  UNABLE TO FIND daily. Herbal life shakes    Yes [provider]  vitamin C (ASCORBIC ACID) 500 MG tablet Take 500 mg by mouth daily.   Yes [provider]    Family History History reviewed. No pertinent family history.  Social History Social History  Substance Use Topics  . Smoking status: Never Smoker  . Smokeless tobacco: Never Used  .  Alcohol use Yes     Comment: occ     Allergies   Penicillins and Trimox [amoxicillin trihydrate]   Review of Systems Review of Systems  Cardiovascular: Positive for chest pain.  All other systems reviewed and are negative.    Physical Exam Updated Vital Signs BP (!) 162/89 (BP Location: Right Arm)   Pulse 61   Temp 98.5 F (36.9 C) (Oral)   Resp 16   SpO2 97%   Physical Exam  Constitutional: She is oriented to person, place, and time. She appears well-developed and well-nourished.  HENT:  Head: Normocephalic and atraumatic.  Eyes: Conjunctivae and EOM are normal.  Neck: Normal range of motion.  Cardiovascular: Normal rate and regular rhythm.  Exam reveals no gallop and no friction rub.   No murmur heard. Pulmonary/Chest: Effort normal and breath sounds normal. No stridor. No respiratory distress. She has no wheezes. She has no rales. She exhibits no tenderness.  Abdominal: Soft. She exhibits no distension.  Neurological: She is alert and oriented to person, place, and time.  Skin: Skin is warm and dry.  Nursing note and vitals reviewed.    ED Treatments / Results  Labs (all labs ordered are listed, but only abnormal results are displayed) Labs Reviewed  CBC - Abnormal; Notable for the following:       Result Value   RBC 5.33 (*)  All other components within normal limits  BASIC METABOLIC PANEL  D-DIMER, QUANTITATIVE (NOT AT [facility])  Randolm Idol, ED  I-STAT TROPOININ, ED    EKG  EKG Interpretation  Date/Time:  Wednesday Sep 05 2016 16:10:33 EDT Ventricular Rate:  81 PR Interval:  162 QRS Duration: 82 QT Interval:  368 QTC Calculation: 427 R Axis:   90 Text Interpretation:  Normal sinus rhythm Rightward axis Borderline ECG No old tracing to compare Confirmed by Merrily Pew [phone]) on 09/05/2016 9:36:03 PM       Radiology Dg Chest 2 View  Result Date: 09/05/2016 CLINICAL DATA:  Left-sided chest pain. EXAM: CHEST  2 VIEW COMPARISON:  Chest CT  06/18/2007 FINDINGS: The cardiomediastinal silhouette is within normal limits. The lungs are well inflated and clear. There is no evidence of pleural effusion or pneumothorax. Thoracic spondylosis is noted. IMPRESSION: No active cardiopulmonary disease. Electronically Signed   By: Logan Bores M.D.   On: 09/05/2016 16:36    Procedures Procedures (including critical care time)  Medications Ordered in ED Medications - No data to display   Initial Impression / Assessment and Plan / ED Course  I have reviewed the triage vital signs and the nursing notes.  Pertinent labs & imaging results that were available during my care of the patient were reviewed by me and considered in my medical decision making (see chart for details).     Workup overall uinremarkable. Atypical cp for ACS. Maybe reflux but not entirely consistent with that. Doubt PE, dissectrion, PTX or other emergent causes for CP at thtis time. Will continue workup with cardiology/PCP.  Final Clinical Impressions(s) / ED Diagnoses   Final diagnoses:  Hypertension, unspecified type  Nonspecific chest pain      Rhys Anchondo, Corene Cornea, MD 09/07/16 1158

## 2016-12-12 DIAGNOSIS — H1045 Other chronic allergic conjunctivitis: Secondary | ICD-10-CM | POA: Diagnosis not present

## 2017-01-03 ENCOUNTER — Ambulatory Visit: Payer: Medicare Other | Admitting: Nurse Practitioner

## 2017-01-08 DIAGNOSIS — N6489 Other specified disorders of breast: Secondary | ICD-10-CM | POA: Diagnosis not present

## 2017-01-21 ENCOUNTER — Ambulatory Visit: Payer: Medicare Other | Admitting: Nurse Practitioner

## 2017-01-22 NOTE — Progress Notes (Signed)
[facility]  PATIENT: FORTUNE TOROSIAN DOB: 1950-12-24   REASON FOR VISIT: Follow-up for  CPAP compliance HISTORY FROM: Patient    HISTORY OF PRESENT ILLNESS: UPDATE 10/03/2018CM Ms.Martian, 66 year old female returns for follow-up for CPAP compliance. She has severe obstructive sleep apnea and periodic limb movements. CPAP compliance data dated 12/24/2016-01/22/2017 shows greater than 4 hours for 17 days or 57%. Average usage 5 hours 5 minutes. Set pressure 9 cm EPR 3AHI 6.8.ESS 8. FSS 22. She returns for reevaluation.   07/02/16 CMMs.[place]ook, 66 year old female returns for CPAP compliance after having a split-night titration sleep study on 02/13/2016. Her last compliance report greater than 4 hours 63% for 19 days. Her compliance today is 25 out of 30 days 83% compliance greater than 4 hours compliance 67%.   Her sleep  shows severe obstructive sleep apnea and periodic limb movements of sleep unknown  clinical significance. She is on  CPAP at 9 cm. She is advised to avoid sedative hypnotics which may worsen her sleep apnea also alcohol and tobacco. She claims  today that  sometimes when she goes to bed she is too tired and forgets to use the appliance. She denies having any illnesses but she could not use.Average usage for days used 5 hours 23 minutes at 9 cm of pressure EPR level 3. AHI for 3.1.  She returns for reevaluation   REVIEW OF SYSTEMS: Full 14 system review of systems performed and notable only for those listed, all others are neg:  Constitutional: neg Cardiovascular: neg Ear/Nose/Throat: neg  Skin: neg Eyes: neg Respiratory: neg Gastroitestinal: neg  Hematology/Lymphatic: neg  Endocrine: neg Musculoskeletal:neg Allergy/Immunology: neg Neurological: neg Psychiatric: neg Sleep : Obstructive sleep apnea with CPAP   ALLERGIES: Allergies  Allergen Reactions  . Penicillins Itching  . Trimox [Amoxicillin Trihydrate] Itching    HOME  MEDICATIONS: Outpatient Medications Prior to Visit  Medication Sig Dispense Refill  . B Complex Vitamins (B COMPLEX 1 PO) Take 1 tablet by mouth daily.     . Cholecalciferol (VITAMIN D) 1000 UNITS capsule Take 5,000 Units by mouth daily.     . Melatonin 5 MG TABS Take 10 mg by mouth at bedtime.     . Multiple Vitamin (MULTIVITAMIN) tablet Take 1 tablet by mouth daily.    . naproxen sodium (ANAPROX) 220 MG tablet Take 220 mg by mouth 2 (two) times daily as needed (for pain).    . vitamin C (ASCORBIC ACID) 500 MG tablet Take 500 mg by mouth daily.    Marland Kitchen UNABLE TO FIND daily. Herbal life shakes      No facility-administered medications prior to visit.     PAST MEDICAL HISTORY: History reviewed. No pertinent past medical history.  PAST SURGICAL HISTORY: History reviewed. No pertinent surgical history.  FAMILY HISTORY: History reviewed. No pertinent family history.  SOCIAL HISTORY: Social History   Social History  . Marital status: Single    Spouse name: N/A  . Number of children: N/A  . Years of education: N/A   Occupational History  . Not on file.   Social History Main Topics  . Smoking status: Never Smoker  . Smokeless tobacco: Never Used  . Alcohol use Yes     Comment: occ  . Drug use: Unknown  . Sexual activity: Not on file   Other Topics Concern  . Not on file   Social History Narrative  . No narrative on file     PHYSICAL EXAM  Vitals:   01/23/17 0810  BP: 119/75  Pulse: 76  Weight: 167 lb 3.2 oz (75.8 kg)  Height: 5\' 3"  (1.6 m)   Body mass index is 29.62 kg/m.  Generalized: Well developed, Obese female in no acute distress  Head: normocephalic and atraumatic,. Oropharynx benign  Neck: Supple,  Musculoskeletal: No deformity   Neurological examination   Mentation: Alert oriented to time, place, history taking. Attention span and concentration appropriate. Recent and remote memory intact.  Follows all commands speech and language fluent. ESS 8. FSS  22 Cranial nerve II-XII: .Pupils were equal round reactive to light extraocular movements were full, visual field were full on confrontational test. Facial sensation and strength were normal. hearing was intact to finger rubbing bilaterally. Uvula tongue midline. head turning and shoulder shrug were normal and symmetric.Tongue protrusion into cheek strength was normal. Motor: normal bulk and tone, full strength in the BUE, BLE, fine finger movements normal, no pronator drift. No focal weakness Sensory: normal and symmetric to light touch, pinprick, and  Vibration, in the upper and lower extremities Coordination: finger-nose-finger, heel-to-shin bilaterally, no dysmetria Reflexes: Symmetric upper and lower, plantar responses were flexor bilaterally. Gait and Station: Rising up from seated position without assistance, normal stance,  moderate stride, good arm swing, smooth turning, able to perform tiptoe, and heel walking without difficulty. Tandem gait is steady  DIAGNOSTIC DATA (LABS, IMAGING, TESTING) - I reviewed patient records, labs, notes, testing and imaging myself where available.  Lab Results  Component Value Date   WBC 6.3 09/05/2016   HGB 14.7 09/05/2016   HCT 45.4 09/05/2016   MCV 85.2 09/05/2016   PLT 231 09/05/2016      Component Value Date/Time   NA 141 09/05/2016 1614   K 4.2 09/05/2016 1614   CL 105 09/05/2016 1614   CO2 28 09/05/2016 1614   GLUCOSE 99 09/05/2016 1614   BUN 12 09/05/2016 1614   CREATININE 0.77 09/05/2016 1614   CREATININE 0.80 11/18/2015 0811   CALCIUM 9.7 09/05/2016 1614   PROT 6.8 11/18/2015 0811   ALBUMIN 4.1 11/18/2015 0811   AST 22 11/18/2015 0811   ALT 19 11/18/2015 0811   ALKPHOS 83 11/18/2015 0811   BILITOT 0.5 11/18/2015 0811   GFRNONAA >60 09/05/2016 1614   GFRNONAA 78 11/18/2015 0811   GFRAA >60 09/05/2016 1614   GFRAA >89 11/18/2015 0811   Lab Results  Component Value Date   CHOL 331 (H) 11/18/2015   HDL 40 (L) 11/18/2015    LDLCALC NOT CALC 11/18/2015   TRIG 429 (H) 11/18/2015   CHOLHDL 8.3 (H) 11/18/2015    Lab Results  Component Value Date   TSH 1.13 11/18/2015      ASSESSMENT AND PLAN  66 y.o. year old female  to follow-up for obstructive sleep apnea  compliance for her CPAP. CPAP compliance data dated 12/24/2016-01/22/2017 shows greater than 4 hours for 17 days or 57%. Average usage 5 hours 5 minutes. Set pressure 9 cm EPR 3AHI 6.8.ESS 8. FSS 22.  CPAP compliance 57% for 17 days. Needs to be >70% compliance report discussed with patient  Continue CPAP at current settings  F/U in 2 months for repeat compliance Important to wear CPAP every night Greater than 50% of time during this 15 minute visit was spent on counseling, reviewing reviewing sleep compliance  with patient  planning of further management,  Dennie Bible, [facility], [facility], [facility] [address] [place], [place] [address] [phone]

## 2017-01-23 ENCOUNTER — Encounter: Payer: Self-pay | Admitting: Nurse Practitioner

## 2017-01-23 ENCOUNTER — Ambulatory Visit (INDEPENDENT_AMBULATORY_CARE_PROVIDER_SITE_OTHER): Payer: Medicare Other | Admitting: Nurse Practitioner

## 2017-01-23 VITALS — BP 119/75 | HR 76 | Ht 63.0 in | Wt 167.2 lb

## 2017-01-23 DIAGNOSIS — Z9989 Dependence on other enabling machines and devices: Secondary | ICD-10-CM

## 2017-01-23 DIAGNOSIS — G4733 Obstructive sleep apnea (adult) (pediatric): Secondary | ICD-10-CM

## 2017-01-23 NOTE — Patient Instructions (Signed)
CPAP compliance 57% for 17 days.  Continue CPAP at current settings  F/U in 2 months for repeat compliance

## 2017-01-24 NOTE — Progress Notes (Signed)
I agree with the assessment and plan as directed by NP .   Valicia Rief, MD  

## 2017-03-26 ENCOUNTER — Ambulatory Visit: Payer: Medicare Other | Admitting: Nurse Practitioner

## 2017-05-09 ENCOUNTER — Ambulatory Visit: Payer: Medicare Other | Admitting: Nurse Practitioner

## 2017-07-03 DIAGNOSIS — Z1231 Encounter for screening mammogram for malignant neoplasm of breast: Secondary | ICD-10-CM | POA: Diagnosis not present

## 2017-07-03 LAB — HM MAMMOGRAPHY

## 2017-07-17 ENCOUNTER — Ambulatory Visit: Payer: Medicare Other | Admitting: Nurse Practitioner

## 2017-07-24 ENCOUNTER — Encounter: Payer: Self-pay | Admitting: Physician Assistant

## 2017-07-31 ENCOUNTER — Ambulatory Visit: Payer: Medicare Other | Admitting: Neurology

## 2017-10-11 ENCOUNTER — Other Ambulatory Visit: Payer: Self-pay

## 2017-10-11 ENCOUNTER — Ambulatory Visit (INDEPENDENT_AMBULATORY_CARE_PROVIDER_SITE_OTHER): Payer: Medicare Other | Admitting: Family Medicine

## 2017-10-11 ENCOUNTER — Encounter: Payer: Self-pay | Admitting: Family Medicine

## 2017-10-11 VITALS — BP 152/82 | HR 79 | Temp 99.0°F | Ht 63.0 in | Wt 169.0 lb

## 2017-10-11 DIAGNOSIS — M545 Low back pain, unspecified: Secondary | ICD-10-CM

## 2017-10-11 DIAGNOSIS — G8929 Other chronic pain: Secondary | ICD-10-CM | POA: Diagnosis not present

## 2017-10-11 DIAGNOSIS — M7630 Iliotibial band syndrome, unspecified leg: Secondary | ICD-10-CM | POA: Diagnosis not present

## 2017-10-11 DIAGNOSIS — R030 Elevated blood-pressure reading, without diagnosis of hypertension: Secondary | ICD-10-CM

## 2017-10-11 NOTE — Progress Notes (Signed)
6/21/20199:52 AM  Brittney Williamson [birth year], 67 y.o. female [id number]  Chief Complaint  Patient presents with  . Back Pain    Past pt of [facility], having back pain that is radiating down into the pelvic area    HPI:   Patient is a 67 y.o. female who presents today for chronic low back pain  3 years of back pain, across her low back Drives for [employer], long hours Lots of getting in and out Radiates sometimes down her buttock and upper thigh She denies any fever or chills, numbness, tingling, focal weakness, tripping/falls She denies any changes in bowel or bladder function (she does report a sense of rectal urgency since she had a colonoscopy last year, she denies any incontinence nor worsening of symptoms) Usually takes naproxen, stopped for the past several week, unclear why Has never had imaging nor done PT  Takes BP once a month when she donates blood, 130/60s She denies any CP, palpitations, cough, SOB, edema  Fall Risk  10/11/2017 11/30/2015 11/17/2015  Falls in the past year? No Yes Yes  Number falls in past yr: - 1 1  Injury with Fall? - Yes Yes     Depression screen [facility] 2/9 10/11/2017 11/30/2015 11/17/2015  Decreased Interest 0 0 0  Down, Depressed, Hopeless 0 0 0  PHQ - 2 Score 0 0 0    Allergies  Allergen Reactions  . Penicillins Itching  . Trimox [Amoxicillin Trihydrate] Itching    Prior to Admission medications   Medication Sig Start Date End Date Taking? Authorizing Provider  B Complex Vitamins (B COMPLEX 1 PO) Take 1 tablet by mouth daily.    Yes [provider]  Cholecalciferol (VITAMIN D) 1000 UNITS capsule Take 5,000 Units by mouth daily.    Yes [provider]  Melatonin 5 MG TABS Take 10 mg by mouth at bedtime.    Yes [provider]  Multiple Vitamin (MULTIVITAMIN) tablet Take 1 tablet by mouth daily.   Yes [provider]  naproxen sodium (ANAPROX) 220 MG tablet Take 220 mg by mouth 2 (two) times daily as  needed (for pain).   Yes [provider]  vitamin C (ASCORBIC ACID) 500 MG tablet Take 500 mg by mouth daily.   Yes [provider]    Past Medical History:  Diagnosis Date  . OSA on CPAP     No past surgical history on file.  Social History   Tobacco Use  . Smoking status: Never Smoker  . Smokeless tobacco: Never Used  Substance Use Topics  . Alcohol use: Yes    Comment: occ    No family history on file.  ROS Per hpi  OBJECTIVE:  Blood pressure (!) 152/82, pulse 79, temperature 99 F (37.2 C), temperature source Oral, height 5\' 3"  (1.6 m), weight 169 lb (76.7 kg), SpO2 97 %.  Physical Exam  Constitutional: She is oriented to person, place, and time. She appears well-developed and well-nourished.  HENT:  Head: Normocephalic and atraumatic.  Mouth/Throat: Oropharynx is clear and moist. No oropharyngeal exudate.  Eyes: Pupils are equal, round, and reactive to light. EOM are normal. No scleral icterus.  Neck: Neck supple.  Cardiovascular: Normal rate, regular rhythm and normal heart sounds. Exam reveals no gallop and no friction rub.  No murmur heard. Pulmonary/Chest: Effort normal and breath sounds normal. She has no wheezes. She has no rales.  Musculoskeletal: She exhibits no edema.       Right hip: Normal.  Left hip: Normal.       Lumbar back: She exhibits tenderness (mild paraspinal bilateral). She exhibits no bony tenderness and no spasm.  Bilateral IT band TTP  Neurological: She is alert and oriented to person, place, and time.  Skin: Skin is warm and dry.  Psychiatric: She has a normal mood and affect.  Nursing note and vitals reviewed.    ASSESSMENT and PLAN  1. Chronic bilateral low back pain without sciatica 2. Iliotibial band syndrome, unspecified laterality Discussed supportive measures, resume naproxen use and RTC precautions. Patient educational handout given.  3. Elevated BP without diagnosis of hypertension Asked that  patient continue to monitor more frequently in ambulatory setting and bring me readings for review   Return in about 3 months (around 01/11/2018) for medicare annual wellness visit.    Rutherford Guys, MD [facility] [place], [place] [address] Ph.  [phone] Fax [phone]

## 2017-10-11 NOTE — Patient Instructions (Addendum)
Please check your blood pressure same time of day 3-4 times a week for the next 2 weeks. Please bring in readings for review. thanks    IF you received an x-ray today, you will receive an invoice from [facility]. Please contact [facility] at [phone] with questions or concerns regarding your invoice.   IF you received labwork today, you will receive an invoice from [place]. Please contact [facility] at [phone] with questions or concerns regarding your invoice.   Our billing staff will not be able to assist you with questions regarding bills from these companies.  You will be contacted with the lab results as soon as they are available. The fastest way to get your results is to activate your My Chart account. Instructions are located on the last page of this paperwork. If you have not heard from [place] regarding the results in 2 weeks, please contact this office.      Iliotibial Band Syndrome Rehab Ask your health care provider which exercises are safe for you. Do exercises exactly as told by your health care provider and adjust them as directed. It is normal to feel mild stretching, pulling, tightness, or discomfort as you do these exercises, but you should stop right away if you feel sudden pain or your pain gets worse.Do not begin these exercises until told by your health care provider. Stretching and range of motion exercises These exercises warm up your muscles and joints and improve the movement and flexibility of your hip and pelvis. Exercise A: Quadriceps, prone  1. Lie on your abdomen on a firm surface, such as a bed or padded floor. 2. Bend your left / right knee and hold your ankle. If you cannot reach your ankle or pant leg, loop a belt around your foot and grab the belt instead. 3. Gently pull your heel toward your buttocks. Your knee should not slide out to the side. You should feel a stretch in the front of your thigh and knee. 4. Hold this position for  __________ seconds. Repeat __________ times. Complete this stretch __________ times a day. Exercise B: Iliotibial band  1. Lie on your side with your left / right leg in the top position. 2. Bend both of your knees and grab your left / right ankle. Stretch out your bottom arm to help you balance. 3. Slowly bring your top knee back so your thigh goes behind your trunk. 4. Slowly lower your top leg toward the floor until you feel a gentle stretch on the outside of your left / right hip and thigh. If you do not feel a stretch and your knee will not fall farther, place the heel of your other foot on top of your knee and pull your knee down toward the floor with your foot. 5. Hold this position for __________ seconds. Repeat __________ times. Complete this stretch __________ times a day. Strengthening exercises These exercises build strength and endurance in your hip and pelvis. Endurance is the ability to use your muscles for a long time, even after they get tired. Exercise C: Straight leg raises ( hip abductors) 1. Lie on your side with your left / right leg in the top position. Lie so your head, shoulder, knee, and hip line up. You may bend your bottom knee to help you balance. 2. Roll your hips slightly forward so your hips are stacked directly over each other and your left / right knee is facing forward. 3. Tense the muscles in your outer thigh  and lift your top leg 4-6 inches (10-15 cm). 4. Hold this position for __________ seconds. 5. Slowly return to the starting position. Let your muscles relax completely before doing another repetition. Repeat __________ times. Complete this exercise __________ times a day. Exercise D: Straight leg raises ( hip extensors) 1. Lie on your abdomen on your bed or a firm surface. You can put a pillow under your hips if that is more comfortable. 2. Bend your left / right knee so your foot is straight up in the air. 3. Squeeze your buttock muscles and lift your  left / right thigh off the bed. Do not let your back arch. 4. Tense this muscle as hard as you can without increasing any knee pain. 5. Hold this position for __________ seconds. 6. Slowly lower your leg to the starting position and allow it to relax completely. Repeat __________ times. Complete this exercise __________ times a day. Exercise E: Hip hike 1. Stand sideways on a bottom step. Stand on your left / right leg with your other foot unsupported next to the step. You can hold onto the railing or wall if needed for balance. 2. Keep your knees straight and your torso square. Then, lift your left / right hip up toward the ceiling. 3. Slowly let your left / right hip lower toward the floor, past the starting position. Your foot should get closer to the floor. Do not lean or bend your knees. Repeat __________ times. Complete this exercise __________ times a day. This information is not intended to replace advice given to you by your health care provider. Make sure you discuss any questions you have with your health care provider. Document Released: 04/09/2005 Document Revised: 12/13/2015 Document Reviewed: 03/11/2015 Elsevier Interactive Patient Education  2018 [place].  Back Exercises The following exercises strengthen the muscles that help to support the back. They also help to keep the lower back flexible. Doing these exercises can help to prevent back pain or lessen existing pain. If you have back pain or discomfort, try doing these exercises 2-3 times each day or as told by your health care provider. When the pain goes away, do them once each day, but increase the number of times that you repeat the steps for each exercise (do more repetitions). If you do not have back pain or discomfort, do these exercises once each day or as told by your health care provider. Exercises Single Knee to Chest  Repeat these steps 3-5 times for each leg: 1. Lie on your back on a firm bed or the floor  with your legs extended. 2. Bring one knee to your chest. Your other leg should stay extended and in contact with the floor. 3. Hold your knee in place by grabbing your knee or thigh. 4. Pull on your knee until you feel a gentle stretch in your lower back. 5. Hold the stretch for 10-30 seconds. 6. Slowly release and straighten your leg.  Pelvic Tilt  Repeat these steps 5-10 times: 1. Lie on your back on a firm bed or the floor with your legs extended. 2. Bend your knees so they are pointing toward the ceiling and your feet are flat on the floor. 3. Tighten your lower abdominal muscles to press your lower back against the floor. This motion will tilt your pelvis so your tailbone points up toward the ceiling instead of pointing to your feet or the floor. 4. With gentle tension and even breathing, hold this position for 5-10 seconds.  Cat-Cow  Repeat these steps until your lower back becomes more flexible: 1. Get into a hands-and-knees position on a firm surface. Keep your hands under your shoulders, and keep your knees under your hips. You may place padding under your knees for comfort. 2. Let your head hang down, and point your tailbone toward the floor so your lower back becomes rounded like the back of a cat. 3. Hold this position for 5 seconds. 4. Slowly lift your head and point your tailbone up toward the ceiling so your back forms a sagging arch like the back of a cow. 5. Hold this position for 5 seconds.  Press-Ups  Repeat these steps 5-10 times: 1. Lie on your abdomen (face-down) on the floor. 2. Place your palms near your head, about shoulder-width apart. 3. While you keep your back as relaxed as possible and keep your hips on the floor, slowly straighten your arms to raise the top half of your body and lift your shoulders. Do not use your back muscles to raise your upper torso. You may adjust the placement of your hands to make yourself more comfortable. 4. Hold this position  for 5 seconds while you keep your back relaxed. 5. Slowly return to lying flat on the floor.  Bridges  Repeat these steps 10 times: 1. Lie on your back on a firm surface. 2. Bend your knees so they are pointing toward the ceiling and your feet are flat on the floor. 3. Tighten your buttocks muscles and lift your buttocks off of the floor until your waist is at almost the same height as your knees. You should feel the muscles working in your buttocks and the back of your thighs. If you do not feel these muscles, slide your feet 1-2 inches farther away from your buttocks. 4. Hold this position for 3-5 seconds. 5. Slowly lower your hips to the starting position, and allow your buttocks muscles to relax completely.  If this exercise is too easy, try doing it with your arms crossed over your chest. Abdominal Crunches  Repeat these steps 5-10 times: 1. Lie on your back on a firm bed or the floor with your legs extended. 2. Bend your knees so they are pointing toward the ceiling and your feet are flat on the floor. 3. Cross your arms over your chest. 4. Tip your chin slightly toward your chest without bending your neck. 5. Tighten your abdominal muscles and slowly raise your trunk (torso) high enough to lift your shoulder blades a tiny bit off of the floor. Avoid raising your torso higher than that, because it can put too much stress on your low back and it does not help to strengthen your abdominal muscles. 6. Slowly return to your starting position.  Back Lifts Repeat these steps 5-10 times: 1. Lie on your abdomen (face-down) with your arms at your sides, and rest your forehead on the floor. 2. Tighten the muscles in your legs and your buttocks. 3. Slowly lift your chest off of the floor while you keep your hips pressed to the floor. Keep the back of your head in line with the curve in your back. Your eyes should be looking at the floor. 4. Hold this position for 3-5 seconds. 5. Slowly return  to your starting position.  Contact a health care provider if:  Your back pain or discomfort gets much worse when you do an exercise.  Your back pain or discomfort does not lessen within 2 hours after you exercise.  If you have any of these problems, stop doing these exercises right away. Do not do them again unless your health care provider says that you can. Get help right away if:  You develop sudden, severe back pain. If this happens, stop doing the exercises right away. Do not do them again unless your health care provider says that you can. This information is not intended to replace advice given to you by your health care provider. Make sure you discuss any questions you have with your health care provider. Document Released: 05/17/2004 Document Revised: 08/17/2015 Document Reviewed: 06/03/2014 Elsevier Interactive Patient Education  2017 [employer].

## 2017-10-15 ENCOUNTER — Telehealth: Payer: Self-pay | Admitting: Neurology

## 2017-10-15 NOTE — Telephone Encounter (Signed)
The pt called and wanted to know if you can refer her to Dr. Toy Cookey for a dental device. Pt stated she is having a hard time wearing her cpap machine and would like to try a dental device. I did inform the pt that you are out of the office at this time. Pt also insisted on scheduling an appointment with you to talk about a dental device and her sleep study. I informed the pt that are you review her sleep study and make your decision a ov appt may not be needed. Pt was in agreeance to that. Pt is scheduled for October 15 ath 10:30.

## 2017-11-07 ENCOUNTER — Other Ambulatory Visit: Payer: Self-pay | Admitting: Neurology

## 2017-11-07 DIAGNOSIS — Z9989 Dependence on other enabling machines and devices: Secondary | ICD-10-CM

## 2017-11-07 DIAGNOSIS — G4733 Obstructive sleep apnea (adult) (pediatric): Secondary | ICD-10-CM

## 2017-11-07 NOTE — Telephone Encounter (Signed)
Spoke with Dr Brett Fairy and she verbalized ok to switch to a dental device. Order for referral placed. Will call the pt and inform her of this.

## 2017-11-08 ENCOUNTER — Other Ambulatory Visit: Payer: Self-pay

## 2017-11-08 ENCOUNTER — Ambulatory Visit (INDEPENDENT_AMBULATORY_CARE_PROVIDER_SITE_OTHER): Payer: Medicare Other | Admitting: Family Medicine

## 2017-11-08 ENCOUNTER — Encounter: Payer: Self-pay | Admitting: Family Medicine

## 2017-11-08 VITALS — BP 132/84 | HR 81 | Temp 98.8°F | Ht 63.11 in | Wt 167.2 lb

## 2017-11-08 DIAGNOSIS — E785 Hyperlipidemia, unspecified: Secondary | ICD-10-CM

## 2017-11-08 DIAGNOSIS — Z0000 Encounter for general adult medical examination without abnormal findings: Secondary | ICD-10-CM

## 2017-11-08 DIAGNOSIS — Z1329 Encounter for screening for other suspected endocrine disorder: Secondary | ICD-10-CM

## 2017-11-08 DIAGNOSIS — E2839 Other primary ovarian failure: Secondary | ICD-10-CM

## 2017-11-08 DIAGNOSIS — M8580 Other specified disorders of bone density and structure, unspecified site: Secondary | ICD-10-CM

## 2017-11-08 NOTE — Patient Instructions (Addendum)
I recommend a healthier diet, regular exercise and healthy weight. Diet low in fried, fatty, processed foods and red meat  Diet high in vegetables, fruits, poultry, salmon, nuts (almonds, peanuts, walnuts), seeds (sunflower, pumpkin and sesame), fiber, olive oil, wheat germ, oat bran, brown rice and flaxseed.   IF you received an x-ray today, you will receive an invoice from [facility]. Please contact [facility] at [phone] with questions or concerns regarding your invoice.   IF you received labwork today, you will receive an invoice from [facility]. Please contact [facility] at [phone] with questions or concerns regarding your invoice.   Our billing staff will not be able to assist you with questions regarding bills from these companies.  You will be contacted with the lab results as soon as they are available. The fastest way to get your results is to activate your My Chart account. Instructions are located on the last page of this paperwork. If you have not heard from us regarding the results in 2 weeks, please contact this office.     Preventive Care 65 Years and Older, Female Preventive care refers to lifestyle choices and visits with your health care provider that can promote health and wellness. What does preventive care include?  A yearly physical exam. This is also called an annual well check.  Dental exams once or twice a year.  Routine eye exams. Ask your health care provider how often you should have your eyes checked.  Personal lifestyle choices, including: ? Daily care of your teeth and gums. ? Regular physical activity. ? Eating a healthy diet. ? Avoiding tobacco and drug use. ? Limiting alcohol use. ? Practicing safe sex. ? Taking low-dose aspirin every day. ? Taking vitamin and mineral supplements as recommended by your health care provider. What happens during an annual well check? The services and screenings done by your health care  provider during your annual well check will depend on your age, overall health, lifestyle risk factors, and family history of disease. Counseling Your health care provider may ask you questions about your:  Alcohol use.  Tobacco use.  Drug use.  Emotional well-being.  Home and relationship well-being.  Sexual activity.  Eating habits.  History of falls.  Memory and ability to understand (cognition).  Work and work environment.  Reproductive health.  Screening You may have the following tests or measurements:  Height, weight, and BMI.  Blood pressure.  Lipid and cholesterol levels. These may be checked every 5 years, or more frequently if you are over 50 years old.  Skin check.  Lung cancer screening. You may have this screening every year starting at age 55 if you have a 30-pack-year history of smoking and currently smoke or have quit within the past 15 years.  Fecal occult blood test (FOBT) of the stool. You may have this test every year starting at age 50.  Flexible sigmoidoscopy or colonoscopy. You may have a sigmoidoscopy every 5 years or a colonoscopy every 10 years starting at age 50.  Hepatitis C blood test.  Hepatitis B blood test.  Sexually transmitted disease (STD) testing.  Diabetes screening. This is done by checking your blood sugar (glucose) after you have not eaten for a while (fasting). You may have this done every 1-3 years.  Bone density scan. This is done to screen for osteoporosis. You may have this done starting at age 65.  Mammogram. This may be done every 1-2 years. Talk to your health care provider about how often   you should have regular mammograms.  Talk with your health care provider about your test results, treatment options, and if necessary, the need for more tests. Vaccines Your health care provider may recommend certain vaccines, such as:  Influenza vaccine. This is recommended every year.  Tetanus, diphtheria, and acellular  pertussis (Tdap, Td) vaccine. You may need a Td booster every 10 years.  Varicella vaccine. You may need this if you have not been vaccinated.  Zoster vaccine. You may need this after age 24.  Measles, mumps, and rubella (MMR) vaccine. You may need at least one dose of MMR if you were born in 1957 or later. You may also need a second dose.  Pneumococcal 13-valent conjugate (PCV13) vaccine. One dose is recommended after age 21.  Pneumococcal polysaccharide (PPSV23) vaccine. One dose is recommended after age 17.  Meningococcal vaccine. You may need this if you have certain conditions.  Hepatitis A vaccine. You may need this if you have certain conditions or if you travel or work in places where you may be exposed to hepatitis A.  Hepatitis B vaccine. You may need this if you have certain conditions or if you travel or work in places where you may be exposed to hepatitis B.  Haemophilus influenzae type b (Hib) vaccine. You may need this if you have certain conditions.  Talk to your health care provider about which screenings and vaccines you need and how often you need them. This information is not intended to replace advice given to you by your health care provider. Make sure you discuss any questions you have with your health care provider. Document Released: 05/06/2015 Document Revised: 12/28/2015 Document Reviewed: 02/08/2015 Elsevier Interactive Patient Education  [employer].

## 2017-11-08 NOTE — Progress Notes (Signed)
7/19/201911:30 AM  Brittney Williamson [birth year], 67 y.o. female [id number]  Chief Complaint  Patient presents with  . Annual Exam    HPI:   Patient is a 67 y.o. female with past medical history significant for HLP, and OSA on CPAP who presents today for medicare annual wellness exam  Last wellness exam 10/2015 Cervical Cancer Screening: aged out Breast Cancer Screening: 2018, gets ordered by obgyn, requesting records Colorectal Cancer Screening: 2018, had polyps, [facility], requesting records Bone Density Testing: 2017, osteopenia with low FRAX Seasonal Influenza Vaccination: declines Td/Tdap Vaccination: declines Pneumococcal Vaccination: declines Zoster Vaccination: declines Frequency of Dental evaluation: Q6 months Frequency of Eye evaluation: will schedule appt   Recent Hospitalizations? No  Health Habits: Current exercise activities include: no regular exercise Exercise: 1 times/week. Diet: in general, a "healthy" diet     Functional Status Survey:   Functional Status Survey: Is the patient deaf or have difficulty hearing?: No Does the patient have difficulty seeing, even when wearing glasses/contacts?: No Does the patient have difficulty concentrating, remembering, or making decisions?: No Does the patient have difficulty walking or climbing stairs?: No Does the patient have difficulty dressing or bathing?: No Does the patient have difficulty doing errands alone such as visiting a doctor's office or shopping?: No   Advanced Care Planning: 1. Patient has executed an Advance Directive: No  Cognitive Assessment: Does the patient have evidence of cognitive impairment? No The patient does not have any evidence of any cognitive problems and denies any  change in mood/affect, appearance, speech, memory or motor skills.  Identification of Risk Factors: Risk factors include: hyperlipidemia, patient reports she has been told she high cholesterol in the past,  treats with diet, not interested in medication  Fall Risk  11/08/2017 10/11/2017 11/30/2015 11/17/2015  Falls in the past year? No No Yes Yes  Number falls in past yr: - - 1 1  Injury with Fall? - - Yes Yes     Depression screen [facility] 2/9 11/08/2017 10/11/2017 11/30/2015  Decreased Interest 0 0 0  Down, Depressed, Hopeless 0 0 0  PHQ - 2 Score 0 0 0    Allergies  Allergen Reactions  . Penicillins Itching  . Trimox [Amoxicillin Trihydrate] Itching    Prior to Admission medications   Medication Sig Start Date End Date Taking? Authorizing Provider  B Complex Vitamins (B COMPLEX 1 PO) Take 1 tablet by mouth daily.     [provider]  Cholecalciferol (VITAMIN D) 1000 UNITS capsule Take 5,000 Units by mouth daily.     [provider]  Melatonin 5 MG TABS Take 10 mg by mouth at bedtime.     [provider]  Multiple Vitamin (MULTIVITAMIN) tablet Take 1 tablet by mouth daily.    [provider]  naproxen sodium (ANAPROX) 220 MG tablet Take 220 mg by mouth 2 (two) times daily as needed (for pain).    [provider]  vitamin C (ASCORBIC ACID) 500 MG tablet Take 500 mg by mouth daily.    [provider]    Past Medical History:  Diagnosis Date  . OSA on CPAP     History reviewed. No pertinent surgical history.  Social History   Tobacco Use  . Smoking status: Never Smoker  . Smokeless tobacco: Never Used  Substance Use Topics  . Alcohol use: Yes    Comment: occ    Family History  Adopted: Yes    Review of Systems  Constitutional: Negative  for chills and fever.  HENT: Negative for congestion, hearing loss and sore throat.   Eyes: Negative for blurred vision and double vision.  Respiratory: Negative for cough and shortness of breath.   Cardiovascular: Negative for chest pain, palpitations and leg swelling.  Gastrointestinal: Negative for abdominal pain, nausea and vomiting.  Genitourinary: Negative for dysuria, frequency,  hematuria and urgency.  Skin: Negative for rash.  Neurological: Negative for dizziness, tingling and headaches.  Psychiatric/Behavioral: The patient does not have insomnia.   All other systems reviewed and are negative.    OBJECTIVE:  Blood pressure 132/84, pulse 81, temperature 98.8 F (37.1 C), temperature source Oral, height 5' 3.11" (1.603 m), weight 167 lb 3.2 oz (75.8 kg), SpO2 96 %.   Visual Acuity Screening   Right eye Left eye Both eyes  Without correction: '20/25 20/25 20/25 '  With correction:       Physical Exam  Constitutional: She is oriented to person, place, and time. She appears well-developed and well-nourished.  HENT:  Head: Normocephalic and atraumatic.  Right Ear: Hearing, tympanic membrane, external ear and ear canal normal.  Left Ear: Hearing, tympanic membrane, external ear and ear canal normal.  Mouth/Throat: Oropharynx is clear and moist.  Eyes: Pupils are equal, round, and reactive to light. Conjunctivae and EOM are normal.  Neck: Neck supple. No thyromegaly present.  Cardiovascular: Normal rate, regular rhythm, normal heart sounds and intact distal pulses. Exam reveals no gallop and no friction rub.  No murmur heard. Pulmonary/Chest: Effort normal and breath sounds normal. She has no wheezes. She has no rales.  Abdominal: Soft. Bowel sounds are normal. She exhibits no distension and no mass. There is no tenderness.  Musculoskeletal: Normal range of motion. She exhibits no edema.  Lymphadenopathy:    She has no cervical adenopathy.  Neurological: She is alert and oriented to person, place, and time. She has normal reflexes. No cranial nerve deficit. Gait normal.  Skin: Skin is warm and dry.  Psychiatric: She has a normal mood and affect.  Nursing note and vitals reviewed.    ASSESSMENT and PLAN  1. Medicare annual wellness visit, subsequent No concerns per history or exam. Routine HCM labs ordered. HCM reviewed/discussed. Anticipatory guidance  regarding healthy weight, lifestyle and choices given.   2. Hyperlipidemia, unspecified hyperlipidemia type Checking labs today, discussed LFM, consider endo if significantly elevated, zetia, PK9 inhibitors?   - CMP14+EGFR - Lipid panel - TSH  3. Screening for thyroid disorder - TSH  4. Osteopenia, unspecified location 5. Estrogen deficiency Discussed healthy bone LFM, provided handout for calcium rich foods, continue with OTC vit D supplement, discussed importance of weight bearing exercise. Dexa ordered to evaluate progression of disease.  - DG Bone Density; Future  Return in about 1 year (around 11/09/2018).    Rutherford Guys, MD [facility] [place], [place] [address] Ph.  [phone] Fax [phone]

## 2017-11-08 NOTE — Telephone Encounter (Signed)
Called pt and informed her that Dr. Brett Fairy said ok to switch to the dental device. Pt very appreciative and has asked to have the referral sent to Dr. Ron Parker. RN advised she will send message to referrals to make them aware. Pt had no further concerns or questions.

## 2017-11-08 NOTE — Telephone Encounter (Signed)
We send a referral including sleep studies and record of treatment thus far- and that the patient couldn't tolerate it.   The patient's health insurance will cover the costs for sleep dentistry as it is treatment of a medical condition ( apnea ) by dental means. I don't order.

## 2017-11-09 LAB — CMP14+EGFR
ALT: 24 IU/L (ref 0–32)
AST: 23 IU/L (ref 0–40)
Albumin/Globulin Ratio: 1.6 (ref 1.2–2.2)
Albumin: 4.4 g/dL (ref 3.6–4.8)
Alkaline Phosphatase: 94 IU/L (ref 39–117)
BUN/Creatinine Ratio: 16 (ref 12–28)
BUN: 13 mg/dL (ref 8–27)
Bilirubin Total: 0.3 mg/dL (ref 0.0–1.2)
CO2: 26 mmol/L (ref 20–29)
Calcium: 9.6 mg/dL (ref 8.7–10.3)
Chloride: 101 mmol/L (ref 96–106)
Creatinine, Ser: 0.8 mg/dL (ref 0.57–1.00)
GFR calc Af Amer: 89 mL/min/{1.73_m2} (ref >59)
GFR calc non Af Amer: 77 mL/min/{1.73_m2} (ref >59)
Globulin, Total: 2.8 g/dL (ref 1.5–4.5)
Glucose: 87 mg/dL (ref 65–99)
Potassium: 4.6 mmol/L (ref 3.5–5.2)
Sodium: 140 mmol/L (ref 134–144)
Total Protein: 7.2 g/dL (ref 6.0–8.5)

## 2017-11-09 LAB — LIPID PANEL
Chol/HDL Ratio: 8.1 ratio — ABNORMAL HIGH (ref 0.0–4.4)
Cholesterol, Total: 308 mg/dL — ABNORMAL HIGH (ref 100–199)
HDL: 38 mg/dL — ABNORMAL LOW (ref >39)
Triglycerides: 459 mg/dL — ABNORMAL HIGH (ref 0–149)

## 2017-11-09 LAB — TSH: TSH: 0.848 u[IU]/mL (ref 0.450–4.500)

## 2017-11-10 ENCOUNTER — Encounter: Payer: Self-pay | Admitting: Family Medicine

## 2017-11-10 DIAGNOSIS — M8580 Other specified disorders of bone density and structure, unspecified site: Secondary | ICD-10-CM | POA: Insufficient documentation

## 2017-12-12 DIAGNOSIS — H52223 Regular astigmatism, bilateral: Secondary | ICD-10-CM | POA: Diagnosis not present

## 2017-12-12 DIAGNOSIS — H1045 Other chronic allergic conjunctivitis: Secondary | ICD-10-CM | POA: Diagnosis not present

## 2017-12-12 DIAGNOSIS — H04123 Dry eye syndrome of bilateral lacrimal glands: Secondary | ICD-10-CM | POA: Diagnosis not present

## 2017-12-12 DIAGNOSIS — H2513 Age-related nuclear cataract, bilateral: Secondary | ICD-10-CM | POA: Diagnosis not present

## 2017-12-12 DIAGNOSIS — H5212 Myopia, left eye: Secondary | ICD-10-CM | POA: Diagnosis not present

## 2017-12-25 NOTE — Progress Notes (Signed)
FINAL MICROSCOPIC DIAGNOSIS: SESSILE SERRATED POLYP. TUBULAR ADENOMA. F/U colon test 66yrs

## 2018-02-04 ENCOUNTER — Ambulatory Visit: Payer: Medicare Other | Admitting: Neurology

## 2018-03-04 ENCOUNTER — Ambulatory Visit (INDEPENDENT_AMBULATORY_CARE_PROVIDER_SITE_OTHER): Payer: Medicare Other | Admitting: Endocrinology

## 2018-03-04 ENCOUNTER — Encounter: Payer: Self-pay | Admitting: Endocrinology

## 2018-03-04 VITALS — BP 136/88 | HR 73 | Ht 63.5 in | Wt 167.0 lb

## 2018-03-04 DIAGNOSIS — E785 Hyperlipidemia, unspecified: Secondary | ICD-10-CM | POA: Diagnosis not present

## 2018-03-04 MED ORDER — FENOFIBRATE 160 MG PO TABS: 160.0000 mg | ORAL_TABLET | Freq: Every day | ORAL | 11 refills | Status: DC

## 2018-03-04 NOTE — Progress Notes (Signed)
Subjective:    Patient ID: Brittney Williamson, female    DOB: [birth year], 67 y.o.   MRN: [id number]  HPI Pt is referred by Dr Pamella Pert, for hypertriglyceridemia.  This was dx'ed in 1993.  she denies h/o pancreatitis, diabetes, recent estrogen rx, steroids, alcoholism, HIV, SLE, HCTZ, retin-A, B-blocker, thyroid problems, pancreatitis, or kidney disease.  This has only been rx'ed with lipitor, but she developed leg cramps.  She declines to resume statin rx, as she says these increase the risk of CVA. She says her diet is good. She has slight numbness of the left foot, but no assoc rash.  Past Medical History:  Diagnosis Date  . OSA on CPAP     No past surgical history on file.  Social History   Socioeconomic History  . Marital status: Single    Spouse name: Not on file  . Number of children: Not on file  . Years of education: Not on file  . Highest education level: Not on file  Occupational History  . Not on file  Social Needs  . Financial resource strain: Not on file  . Food insecurity:    Worry: Not on file    Inability: Not on file  . Transportation needs:    Medical: Not on file    Non-medical: Not on file  Tobacco Use  . Smoking status: Never Smoker  . Smokeless tobacco: Never Used  Substance and Sexual Activity  . Alcohol use: Yes    Comment: occ  . Drug use: Never  . Sexual activity: Not Currently  Lifestyle  . Physical activity:    Days per week: Not on file    Minutes per session: Not on file  . Stress: Not on file  Relationships  . Social connections:    Talks on phone: Not on file    Gets together: Not on file    Attends religious service: Not on file    Active member of club or organization: Not on file    Attends meetings of clubs or organizations: Not on file    Relationship status: Not on file  . Intimate partner violence:    Fear of current or ex partner: Not on file    Emotionally abused: Not on file    Physically abused: Not on file    Forced  sexual activity: Not on file  Other Topics Concern  . Not on file  Social History Narrative  . Not on file    Current Outpatient Medications on File Prior to Visit  Medication Sig Dispense Refill  . B Complex Vitamins (B COMPLEX 1 PO) Take 1 tablet by mouth daily.     . Cholecalciferol (VITAMIN D) 1000 UNITS capsule Take 5,000 Units by mouth 5 (five) times daily.     Marland Kitchen ELDERBERRY PO Take 2 tablets by mouth daily.    . Melatonin 5 MG TABS Take 5 mg by mouth at bedtime.     . Multiple Vitamin (MULTIVITAMIN) tablet Take 1 tablet by mouth daily.    . naproxen sodium (ANAPROX) 220 MG tablet Take 220 mg by mouth 2 (two) times daily as needed (for pain).    . vitamin C (ASCORBIC ACID) 500 MG tablet Take 500 mg by mouth daily.     No current facility-administered medications on file prior to visit.     Allergies  Allergen Reactions  . Penicillins Itching  . Trimox [Amoxicillin Trihydrate] Itching    Family History  Adopted: Yes  BP 136/88 (BP Location: Left Arm, Patient Position: Sitting, Cuff Size: Normal)   Pulse 73   Ht 5' 3.5" (1.613 m)   Wt 167 lb (75.8 kg)   SpO2 95%   BMI 29.12 kg/m    Review of Systems denies weight loss, blurry vision, headache, chest pain, sob, n/v, urinary frequency, muscle cramps, excessive diaphoresis, memory loss, depression, cold intolerance, rhinorrhea, easy bruising, neck swelling, abd pain, polyuria, and arthralgias.     Objective:   Physical Exam VS: see vs page GEN: no distress HEAD: head: no deformity eyes: no periorbital swelling, no proptosis external nose and ears are normal mouth: no lesion seen NECK: supple, thyroid is not enlarged CHEST WALL: no deformity LUNGS: clear to auscultation CV: reg rate and rhythm, no murmur ABD: abdomen is soft, nontender.  no hepatosplenomegaly.  not distended.  no hernia MUSCULOSKELETAL: muscle bulk and strength are grossly normal.  no obvious joint swelling.  gait is normal and  steady EXTEMITIES: no deformity.  no ulcer on the feet.  feet are of normal color and temp.  no edema PULSES: dorsalis pedis intact bilat.  no carotid bruit NEURO:  cn 2-12 grossly intact.   readily moves all 4's.  sensation is intact to touch on the feet SKIN:  Normal texture and temperature.  No rash or suspicious lesion is visible.  No cutaneous stigmata are seen.  NODES:  None palpable at the neck PSYCH: alert, well-oriented.  Does not appear anxious nor depressed.    Lab Results  Component Value Date   CHOL 308 (H) 11/08/2017   HDL 38 (L) 11/08/2017   LDLCALC Comment 11/08/2017   TRIG 459 (H) 11/08/2017   CHOLHDL 8.1 (H) 11/08/2017   Lab Results  Component Value Date   TSH 0.848 11/08/2017   Glucose=99  I have reviewed outside records, and summarized: Pt was noted to have elevated TG, and referred here.  The possibility of medication was brought up with pt, but he declined     Assessment & Plan:  Hypertriglyceridemia, new to me.  Usually due to combined dyslipidemia.  We discussed  Patient Instructions  I have sent a prescription to your pharmacy, for "fenofibrate."  Please redo the blood tests in 1 month.  If necessary, we can add "ezetamibe."  Please come back for a follow-up appointment in 2 months, fasting.         Food Choices to Lower Your Triglycerides Triglycerides are a type of fat in your blood. High levels of triglycerides can increase the risk of heart disease and stroke. If your triglyceride levels are high, the foods you eat and your eating habits are very important. Choosing the right foods can help lower your triglycerides. What general guidelines do I need to follow?  Lose weight if you are overweight.  Limit or avoid alcohol.  Fill one half of your plate with vegetables and green salads.  Limit fruit to two servings a day. Choose fruit instead of juice.  Make one fourth of your plate whole grains. Look for the word "whole" as the first word  in the ingredient list.  Fill one fourth of your plate with lean protein foods.  Enjoy fatty fish (such as salmon, mackerel, sardines, and tuna) three times a week.  Choose healthy fats.  Limit foods high in starch and sugar.  Eat more home-cooked food and less restaurant, buffet, and fast food.  Limit fried foods.  Cook foods using methods other than frying.  Limit saturated fats.  Check ingredient lists to avoid foods with partially hydrogenated oils (trans fats) in them. What foods can I eat? Grains Whole grains, such as whole wheat or whole grain breads, crackers, cereals, and pasta. Unsweetened oatmeal, bulgur, barley, quinoa, or brown rice. Corn or whole wheat flour tortillas. Vegetables Fresh or frozen vegetables (raw, steamed, roasted, or grilled). Green salads. Fruits All fresh, canned (in natural juice), or frozen fruits. Meat and Other Protein Products Ground beef (85% or leaner), grass-fed beef, or beef trimmed of fat. Skinless chicken or [place]. Ground chicken or [place]. Pork trimmed of fat. All fish and seafood. Eggs. Dried beans, peas, or lentils. Unsalted nuts or seeds. Unsalted canned or dry beans. Dairy Low-fat dairy products, such as skim or 1% milk, 2% or reduced-fat cheeses, low-fat ricotta or cottage cheese, or plain low-fat yogurt. Fats and Oils Tub margarines without trans fats. Light or reduced-fat mayonnaise and salad dressings. Avocado. Safflower, olive, or canola oils. Natural peanut or almond butter. The items listed above may not be a complete list of recommended foods or beverages. Contact your dietitian for more options. What foods are not recommended? Grains White bread. White pasta. White rice. Cornbread. Bagels, pastries, and croissants. Crackers that contain trans fat. Vegetables White potatoes. Corn. Creamed or fried vegetables. Vegetables in a cheese sauce. Fruits Dried fruits. Canned fruit in light or heavy syrup. Fruit juice. Meat and  Other Protein Products Fatty cuts of meat. Ribs, chicken wings, bacon, sausage, bologna, salami, chitterlings, fatback, hot dogs, bratwurst, and packaged luncheon meats. Dairy Whole or 2% milk, cream, half-and-half, and cream cheese. Whole-fat or sweetened yogurt. Full-fat cheeses. Nondairy creamers and whipped toppings. Processed cheese, cheese spreads, or cheese curds. Sweets and Desserts Corn syrup, sugars, honey, and molasses. Candy. Jam and jelly. Syrup. Sweetened cereals. Cookies, pies, cakes, donuts, muffins, and ice cream. Fats and Oils Butter, stick margarine, lard, shortening, ghee, or bacon fat. Coconut, palm kernel, or palm oils. Beverages Alcohol. Sweetened drinks (such as sodas, lemonade, and fruit drinks or punches). The items listed above may not be a complete list of foods and beverages to avoid. Contact your dietitian for more information. This information is not intended to replace advice given to you by your health care provider. Make sure you discuss any questions you have with your health care provider. Document Released: 01/26/2004 Document Revised: 09/15/2015 Document Reviewed: 02/11/2013 Elsevier Interactive Patient Education  2017 [employer].

## 2018-03-04 NOTE — Patient Instructions (Addendum)
I have sent a prescription to your pharmacy, for "fenofibrate."  Please redo the blood tests in 1 month.  If necessary, we can add "ezetamibe."  Please come back for a follow-up appointment in 2 months, fasting.         Food Choices to Lower Your Triglycerides Triglycerides are a type of fat in your blood. High levels of triglycerides can increase the risk of heart disease and stroke. If your triglyceride levels are high, the foods you eat and your eating habits are very important. Choosing the right foods can help lower your triglycerides. What general guidelines do I need to follow?  Lose weight if you are overweight.  Limit or avoid alcohol.  Fill one half of your plate with vegetables and green salads.  Limit fruit to two servings a day. Choose fruit instead of juice.  Make one fourth of your plate whole grains. Look for the word "whole" as the first word in the ingredient list.  Fill one fourth of your plate with lean protein foods.  Enjoy fatty fish (such as salmon, mackerel, sardines, and tuna) three times a week.  Choose healthy fats.  Limit foods high in starch and sugar.  Eat more home-cooked food and less restaurant, buffet, and fast food.  Limit fried foods.  Cook foods using methods other than frying.  Limit saturated fats.  Check ingredient lists to avoid foods with partially hydrogenated oils (trans fats) in them. What foods can I eat? Grains Whole grains, such as whole wheat or whole grain breads, crackers, cereals, and pasta. Unsweetened oatmeal, bulgur, barley, quinoa, or brown rice. Corn or whole wheat flour tortillas. Vegetables Fresh or frozen vegetables (raw, steamed, roasted, or grilled). Green salads. Fruits All fresh, canned (in natural juice), or frozen fruits. Meat and Other Protein Products Ground beef (85% or leaner), grass-fed beef, or beef trimmed of fat. Skinless chicken or [place]. Ground chicken or [place]. Pork trimmed of fat. All fish  and seafood. Eggs. Dried beans, peas, or lentils. Unsalted nuts or seeds. Unsalted canned or dry beans. Dairy Low-fat dairy products, such as skim or 1% milk, 2% or reduced-fat cheeses, low-fat ricotta or cottage cheese, or plain low-fat yogurt. Fats and Oils Tub margarines without trans fats. Light or reduced-fat mayonnaise and salad dressings. Avocado. Safflower, olive, or canola oils. Natural peanut or almond butter. The items listed above may not be a complete list of recommended foods or beverages. Contact your dietitian for more options. What foods are not recommended? Grains White bread. White pasta. White rice. Cornbread. Bagels, pastries, and croissants. Crackers that contain trans fat. Vegetables White potatoes. Corn. Creamed or fried vegetables. Vegetables in a cheese sauce. Fruits Dried fruits. Canned fruit in light or heavy syrup. Fruit juice. Meat and Other Protein Products Fatty cuts of meat. Ribs, chicken wings, bacon, sausage, bologna, salami, chitterlings, fatback, hot dogs, bratwurst, and packaged luncheon meats. Dairy Whole or 2% milk, cream, half-and-half, and cream cheese. Whole-fat or sweetened yogurt. Full-fat cheeses. Nondairy creamers and whipped toppings. Processed cheese, cheese spreads, or cheese curds. Sweets and Desserts Corn syrup, sugars, honey, and molasses. Candy. Jam and jelly. Syrup. Sweetened cereals. Cookies, pies, cakes, donuts, muffins, and ice cream. Fats and Oils Butter, stick margarine, lard, shortening, ghee, or bacon fat. Coconut, palm kernel, or palm oils. Beverages Alcohol. Sweetened drinks (such as sodas, lemonade, and fruit drinks or punches). The items listed above may not be a complete list of foods and beverages to avoid. Contact your dietitian for more information.  This information is not intended to replace advice given to you by your health care provider. Make sure you discuss any questions you have with your health care  provider. Document Released: 01/26/2004 Document Revised: 09/15/2015 Document Reviewed: 02/11/2013 Elsevier Interactive Patient Education  2017 [employer].

## 2018-04-10 ENCOUNTER — Other Ambulatory Visit: Payer: Medicare Other

## 2018-04-14 ENCOUNTER — Other Ambulatory Visit (INDEPENDENT_AMBULATORY_CARE_PROVIDER_SITE_OTHER): Payer: Medicare Other

## 2018-04-14 ENCOUNTER — Encounter: Payer: Medicare Other | Attending: Endocrinology | Admitting: Dietician

## 2018-04-14 DIAGNOSIS — E785 Hyperlipidemia, unspecified: Secondary | ICD-10-CM | POA: Diagnosis not present

## 2018-04-14 LAB — HEPATIC FUNCTION PANEL
ALT: 25 U/L (ref 0–35)
AST: 21 U/L (ref 0–37)
Albumin: 4.4 g/dL (ref 3.5–5.2)
Alkaline Phosphatase: 83 U/L (ref 39–117)
Bilirubin, Direct: 0 mg/dL (ref 0.0–0.3)
Total Bilirubin: 0.4 mg/dL (ref 0.2–1.2)
Total Protein: 7.2 g/dL (ref 6.0–8.3)

## 2018-04-14 LAB — LIPID PANEL
Cholesterol: 238 mg/dL — ABNORMAL HIGH (ref 0–200)
HDL: 38.3 mg/dL — ABNORMAL LOW (ref >39.00)
NonHDL: 199.24
Total CHOL/HDL Ratio: 6
Triglycerides: 339 mg/dL — ABNORMAL HIGH (ref 0.0–149.0)
VLDL: 67.8 mg/dL — ABNORMAL HIGH (ref 0.0–40.0)

## 2018-04-14 LAB — LDL CHOLESTEROL, DIRECT: Direct LDL: 57 mg/dL

## 2018-04-14 NOTE — Progress Notes (Signed)
  Medical Nutrition Therapy:  Appt start time: 0815 end time:  0845.   Assessment:  Primary concerns today: Patient is here alone.  She was referred for hyperlipidemia.   Labs 04/14/18:  Cholesterol 238, HDL 38, LDL 57, Triglycerides 339.  She has not taken the medication for her triglycerides.  She has made some changes to her diet and lost 7 lbs.  Her labs have improved from cholesterol 308 and triglycerides 459 11/08/17 but triglycerides remain elevated.  Patient wishes to continue to lose weight and watch her nutrition choices to bring the numbers down further. Lab results today taken after this visit. Weight 162 lbs today decreased from 169 lbs.  She has been using an Herbalife shake daily. She states that she tried the sugar buster diet in the past and her cholesterol came down but she had hypoglycemia symptoms and decreased memory.  Patient lives alone.  She never married.  She cared for her parents until their death.  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    Preferred Learning Style:   No preference indicated   Learning Readiness:   Ready  Change in progress   MEDICATIONS: see list to include vitamin B-12, elderberry, zinc, melatonin, vitamin C, vitamin D, and MVI.   DIETARY INTAKE:  Usual eating pattern includes 3 meals and 1-2 snacks per day.  24-hr recall:  B ( AM): steel cut oats, honey or brown sugar, pecans, craisins or berries OR 2 eggs, toast, butter  Snk ( AM): occasional toast with honey L (1:30 PM): [employer] Crispy Chicken sandwich OR homemade soup OR salmon, vegetables, sweet potatoes or occasional brown rice OR just a bag of vegetables Snk ( PM): occasional boiled egg D ( PM): salad, beans Snk ( PM): popcorn or vegetables Beverages: coffee, cream and often 1 tsp sugar,  Green tea with honey and milk  Usual physical activity: with work, does all of her own yard work, home maintenance and rehab   Progress Towards Goal(s):  In progress.   Nutritional  Diagnosis:  NB-1.1 Food and nutrition-related knowledge deficit As related to hyperlipidemia.  As evidenced by patient report.    Intervention:  Nutrition education related to hyperlipidemia and nutrition.  Discussed benefits of increased plant based eating/mediterranean style of eating and increased plants and fiber.  Discussed using olive oil and reduced amounts rather than butter and including a source of Omega 3 fats in her diet.  Discussed benefits of exercise on overall health.  Consider more plant based/mediterranean. Increase the fiber in your diet Small amounts of olive oil rather than butter Continue to stay active.  Zumba, dance, walk  Sources of Omega-3 (walnuts, ground flax seeds, chia seeds, salmon) Continue decreased sugar  Teaching Method Utilized:  Visual Auditory  Handouts given during visit include:  Plant based primer  Mediterranean diet  Barriers to learning/adherence to lifestyle change: none  Demonstrated degree of understanding via:  Teach Back   Monitoring/Evaluation:  Dietary intake, exercise, and body weight prn.

## 2018-04-14 NOTE — Patient Instructions (Addendum)
Consider more plant based/mediterranean. Increase the fiber in your diet Small amounts of olive oil rather than butter Continue to stay active.  Zumba, dance, walk  Sources of Omega-3 (walnuts, ground flax seeds, chia seeds, salmon) Continue decreased sugar

## 2018-04-21 DIAGNOSIS — M8589 Other specified disorders of bone density and structure, multiple sites: Secondary | ICD-10-CM | POA: Diagnosis not present

## 2018-04-21 LAB — HM DEXA SCAN

## 2018-04-25 ENCOUNTER — Encounter: Payer: Medicare Other | Admitting: Dietician

## 2018-04-25 NOTE — Progress Notes (Signed)
No statistically significant changes since last exam. All sites measured are stable

## 2018-04-29 ENCOUNTER — Encounter: Payer: Self-pay | Admitting: Neurology

## 2018-04-30 ENCOUNTER — Ambulatory Visit (INDEPENDENT_AMBULATORY_CARE_PROVIDER_SITE_OTHER): Payer: Medicare Other | Admitting: Neurology

## 2018-04-30 ENCOUNTER — Encounter

## 2018-04-30 ENCOUNTER — Encounter: Payer: Self-pay | Admitting: Neurology

## 2018-04-30 VITALS — BP 136/89 | HR 71 | Ht 63.5 in | Wt 161.0 lb

## 2018-04-30 DIAGNOSIS — Z9989 Dependence on other enabling machines and devices: Secondary | ICD-10-CM

## 2018-04-30 DIAGNOSIS — G4730 Sleep apnea, unspecified: Secondary | ICD-10-CM | POA: Diagnosis not present

## 2018-04-30 DIAGNOSIS — M8580 Other specified disorders of bone density and structure, unspecified site: Secondary | ICD-10-CM | POA: Diagnosis not present

## 2018-04-30 DIAGNOSIS — I10 Essential (primary) hypertension: Secondary | ICD-10-CM | POA: Diagnosis not present

## 2018-04-30 DIAGNOSIS — G4733 Obstructive sleep apnea (adult) (pediatric): Secondary | ICD-10-CM

## 2018-04-30 DIAGNOSIS — G4700 Insomnia, unspecified: Secondary | ICD-10-CM

## 2018-04-30 NOTE — Progress Notes (Signed)
[facility]  PATIENT: Brittney Williamson DOB: 29-Sep-1950   REASON FOR VISIT: Follow-up for CPAP compliance. 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 After her diagnosis with sleep apnea out in the split-night titration study from 13 February 2016 she had voiced an interest in the dental device and had actually spoken to Dr. Ron Parker, but financial means were not such that she could pursue this treatment her AHI was rather high she had severe apnea with an AHI of 45.7 slightly accentuated in supine and in rem sleep.  Her AHI became 1.3 after she was titrated to 9 cmH2O CPAP and she has therefore pursued for CPAP treatment she endorsed today the Epworth Sleepiness Scale at 6 points the fatigue severity scale of 0 the geriatric depression score at 1 out of 15.  Good compliance to CPAP however was spotty I do have a 3 months compliance data and for the month of November she did very well however in December she barely uses the CPAP every third day and she had not used it through the month of October either.  CPAP is set at 9 cm water pressure with 3 cm EPR and the average user time is 1 hour and 23 minutes.  She does have very minor air leakage and some central apneas arising.  I would like for her to continue on CPAP and she still owns an S9 AutoSet.  The patient stated her sleep study felt not representative for her normal sleep.  Can she repeat a study?  I will arrange for a HST.    UPDATE 01/23/2017.CM [name], 68 year old female returns for follow-up for CPAP compliance. She has severe obstructive sleep apnea and periodic limb movements. CPAP compliance data dated 12/24/2016-01/22/2017 shows greater than 4 hours for 17 days or 57%. Average usage 5 hours 5 minutes. Set  pressure 9 cm EPR 3AHI 6.8.ESS 8. FSS 22. She returns for reevaluation.   07/02/16 CMMs.[name], 68 year old female returns for CPAP compliance after having a split-night titration sleep study on 02/13/2016. Her last compliance report greater than 4 hours 63% for 19 days. Her compliance today is 25 out of 30 days 83% compliance greater than 4 hours compliance 67%.   Her sleep  shows severe obstructive sleep apnea and periodic limb movements of sleep unknown  clinical significance. She is on  CPAP at 9 cm. She is advised to avoid sedative hypnotics which may worsen her sleep apnea also alcohol and tobacco. She claims  today that  sometimes when she goes to bed she is too tired and forgets to use the appliance. She denies having any illnesses but she could not use.Average usage for days used 5 hours 23 minutes at 9 cm of pressure EPR level 3. AHI for 3.1.  She returns for reevaluation   REVIEW OF SYSTEMS: Full 14 system review of systems performed and notable only for those listed, all others are neg:  Constitutional: neg Cardiovascular: neg Ear/Nose/Throat: neg  Skin: neg Eyes: neg Respiratory: neg Gastroitestinal: neg  Hematology/Lymphatic: neg  Endocrine: neg Musculoskeletal:neg Allergy/Immunology: neg Neurological: neg Psychiatric: neg Sleep : Obstructive sleep apnea with CPAP   ALLERGIES: Allergies  Allergen Reactions  . Penicillins Itching  . Trimox [Amoxicillin Trihydrate] Itching    HOME MEDICATIONS: Outpatient Medications  Prior to Visit  Medication Sig Dispense Refill  . B Complex Vitamins (B COMPLEX 1 PO) Take 1 tablet by mouth daily.     Marland Kitchen ELDERBERRY PO Take 2 tablets by mouth daily.    . Melatonin 5 MG TABS Take 5 mg by mouth at bedtime.     . Multiple Vitamin (MULTIVITAMIN) tablet Take 1 tablet by mouth daily.    . naproxen sodium (ANAPROX) 220 MG tablet Take 220 mg by mouth 2 (two) times daily as needed (for pain).    . vitamin B-12 (CYANOCOBALAMIN) 100 MCG tablet  Take 100 mcg by mouth daily.    . vitamin C (ASCORBIC ACID) 500 MG tablet Take 500 mg by mouth daily.    Marland Kitchen VITAMIN D PO Take 20,000 Units by mouth daily.     Marland Kitchen zinc gluconate 50 MG tablet Take 50 mg by mouth daily.    . fenofibrate 160 MG tablet Take 1 tablet (160 mg total) by mouth daily. (Patient not taking: Reported on 04/14/2018) 30 tablet 11   No facility-administered medications prior to visit.     PAST MEDICAL HISTORY: Past Medical History:  Diagnosis Date  . OSA on CPAP     PAST SURGICAL HISTORY: No past surgical history on file.  FAMILY HISTORY: Family History  Adopted: Yes    SOCIAL HISTORY: Social History   Socioeconomic History  . Marital status: Single    Spouse name: Not on file  . Number of children: Not on file  . Years of education: Not on file  . Highest education level: Not on file  Occupational History  . Not on file  Social Needs  . Financial resource strain: Not on file  . Food insecurity:    Worry: Not on file    Inability: Not on file  . Transportation needs:    Medical: Not on file    Non-medical: Not on file  Tobacco Use  . Smoking status: Never Smoker  . Smokeless tobacco: Never Used  Substance and Sexual Activity  . Alcohol use: Yes    Comment: occ  . Drug use: Never  . Sexual activity: Not Currently  Lifestyle  . Physical activity:    Days per week: Not on file    Minutes per session: Not on file  . Stress: Not on file  Relationships  . Social connections:    Talks on phone: Not on file    Gets together: Not on file    Attends religious service: Not on file    Active member of club or organization: Not on file    Attends meetings of clubs or organizations: Not on file    Relationship status: Not on file  . Intimate partner violence:    Fear of current or ex partner: Not on file    Emotionally abused: Not on file    Physically abused: Not on file    Forced sexual activity: Not on file  Other Topics Concern  . Not on file   Social History Narrative  . Not on file     PHYSICAL EXAM  Vitals:   04/30/18 1100  BP: 136/89  Pulse: 71  Weight: 161 lb (73 kg)  Height: 5' 3.5" (1.613 m)   Body mass index is 28.07 kg/m.  Generalized: Well developed, Obese female in no acute distress  Head: normocephalic and atraumatic,. Oropharynx benign  Neck: Supple,  Musculoskeletal: No deformity   Neurological examination   Mentation: Alert oriented to time, place, history taking.  Attention span and concentration appropriate. Recent and remote memory intact.  Follows all commands speech and language fluent. ESS 8. FSS 22 Cranial nerve II-XII: .Pupils were equal round reactive to light extraocular movements were full, visual field were full on confrontational test. Facial sensation and strength were normal. hearing was intact to finger rubbing bilaterally. Uvula tongue midline. head turning and shoulder shrug were normal and symmetric.Tongue protrusion into cheek strength was normal. Motor: normal bulk and tone, full strength in the BUE, BLE, fine finger movements normal, no pronator drift. No focal weakness Sensory: normal and symmetric to light touch, pinprick, and  Vibration, in the upper and lower extremities Coordination: finger-nose-finger, heel-to-shin bilaterally, no dysmetria Reflexes: Symmetric upper and lower, plantar responses were flexor bilaterally. Gait and Station: Rising up from seated position without assistance, normal stance,  moderate stride, good arm swing, smooth turning, able to perform tiptoe, and heel walking without difficulty. Tandem gait is steady  DIAGNOSTIC DATA (LABS, IMAGING, TESTING) - I reviewed patient records, labs, notes, testing and imaging myself where available.  Lab Results  Component Value Date   WBC 6.3 09/05/2016   HGB 14.7 09/05/2016   HCT 45.4 09/05/2016   MCV 85.2 09/05/2016   PLT 231 09/05/2016      Component Value Date/Time   NA 140 11/08/2017 1402   K 4.6  11/08/2017 1402   CL 101 11/08/2017 1402   CO2 26 11/08/2017 1402   GLUCOSE 87 11/08/2017 1402   GLUCOSE 99 09/05/2016 1614   BUN 13 11/08/2017 1402   CREATININE 0.80 11/08/2017 1402   CREATININE 0.80 11/18/2015 0811   CALCIUM 9.6 11/08/2017 1402   PROT 7.2 04/14/2018 0919   PROT 7.2 11/08/2017 1402   ALBUMIN 4.4 04/14/2018 0919   ALBUMIN 4.4 11/08/2017 1402   AST 21 04/14/2018 0919   ALT 25 04/14/2018 0919   ALKPHOS 83 04/14/2018 0919   BILITOT 0.4 04/14/2018 0919   BILITOT 0.3 11/08/2017 1402   GFRNONAA 77 11/08/2017 1402   GFRNONAA 78 11/18/2015 0811   GFRAA 89 11/08/2017 1402   GFRAA >89 11/18/2015 0811   Lab Results  Component Value Date   CHOL 238 (H) 04/14/2018   HDL 38.30 (L) 04/14/2018   LDLCALC Comment 11/08/2017   LDLDIRECT 57.0 04/14/2018   TRIG 339.0 (H) 04/14/2018   CHOLHDL 6 04/14/2018    Lab Results  Component Value Date   TSH 0.848 11/08/2017      ASSESSMENT AND PLAN  68 y.o. year old female  to follow-up for obstructive sleep apnea  compliance for her CPAP. CPAP compliance data dated 12/24/2016-01/22/2017 shows greater than 4 hours for 17 days or 57%. Average usage 5 hours 5 minutes. Set pressure 9 cm EPR 3AHI 6.8.ESS 8. FSS 22.  CPAP compliance was very poor  for 90 days.  Needs to be >75% compliance report discussed with patient - no supplies can be provided.  I would like to continue CPAP at current settings, and urged her to do that , meanwhile I will repeat a HST watch pat to confirm apnea is present and order an autotitration device if possible.    F/U in 3 months for repeat compliance with NP Hassell Done.   [facility] [address], [place], [place] [address] [phone]

## 2018-05-06 ENCOUNTER — Telehealth: Payer: Self-pay | Admitting: Endocrinology

## 2018-05-06 MED ORDER — FENOFIBRATE 160 MG PO TABS: 160.0000 mg | ORAL_TABLET | Freq: Every day | ORAL | 3 refills | Status: DC

## 2018-05-06 NOTE — Telephone Encounter (Signed)
Patient stated Dr Loanne Drilling sent a prescription in november and she did not have a chance to go pick it up and would like it resent       Medication  "fenofibrate."    [place], [place] - [place]

## 2018-05-06 NOTE — Telephone Encounter (Signed)
I have sent a prescription to pt's pharmacy, to refill.

## 2018-05-06 NOTE — Telephone Encounter (Signed)
Ok to re-send?  °

## 2018-05-06 NOTE — Telephone Encounter (Signed)
Please refill prn 

## 2018-05-06 NOTE — Telephone Encounter (Signed)
I do not see this medication on her medication list. Please advise strength and frequency for Fenofibrate.

## 2018-05-08 ENCOUNTER — Ambulatory Visit: Payer: Medicare Other | Admitting: Endocrinology

## 2018-05-09 IMAGING — DX DG CHEST 2V
2 series · 2 of 2 positions shown · non-contrast
Comparison: Chest CT 06/18/2007

CLINICAL DATA: Left-sided chest pain.

EXAM:
CHEST  2 VIEW

[w chest pa]
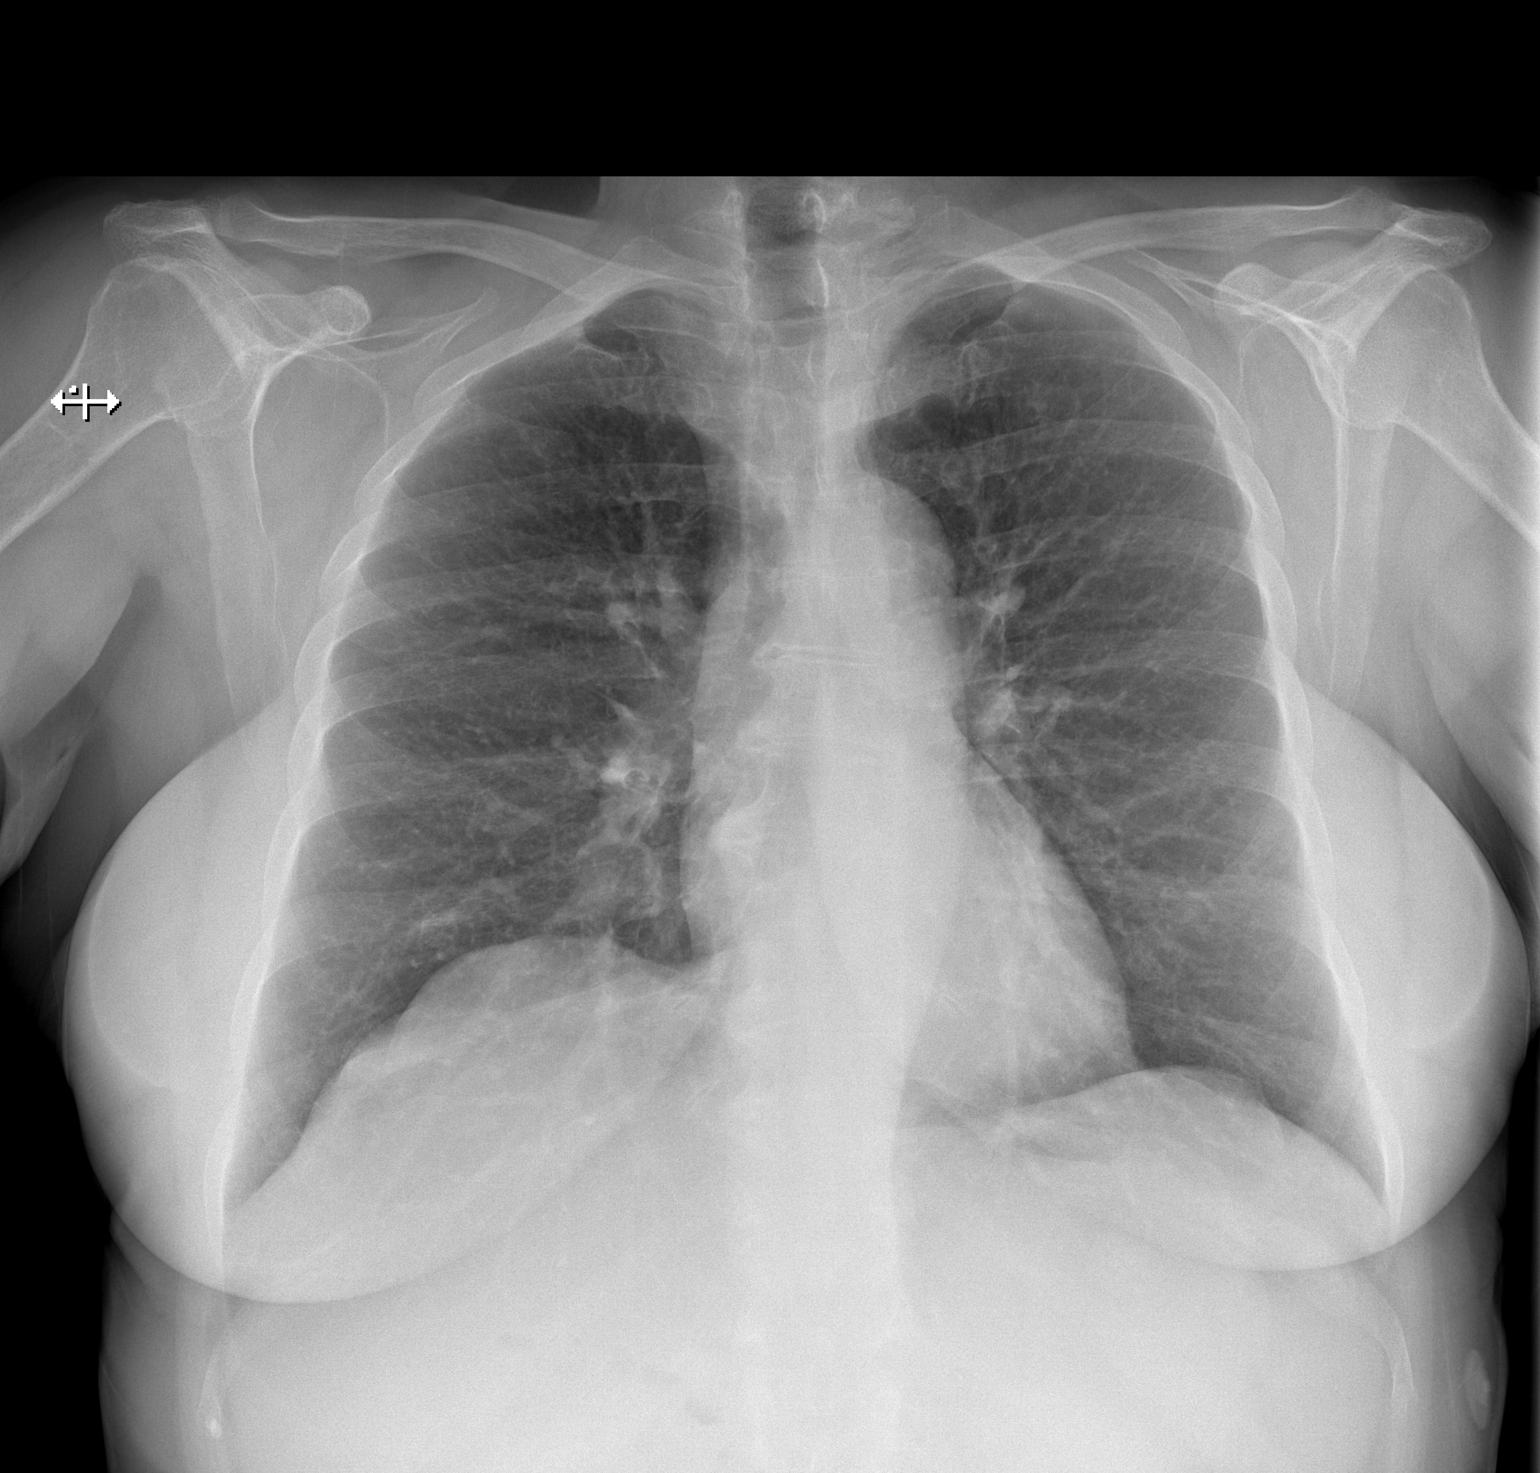

[w chest lat]
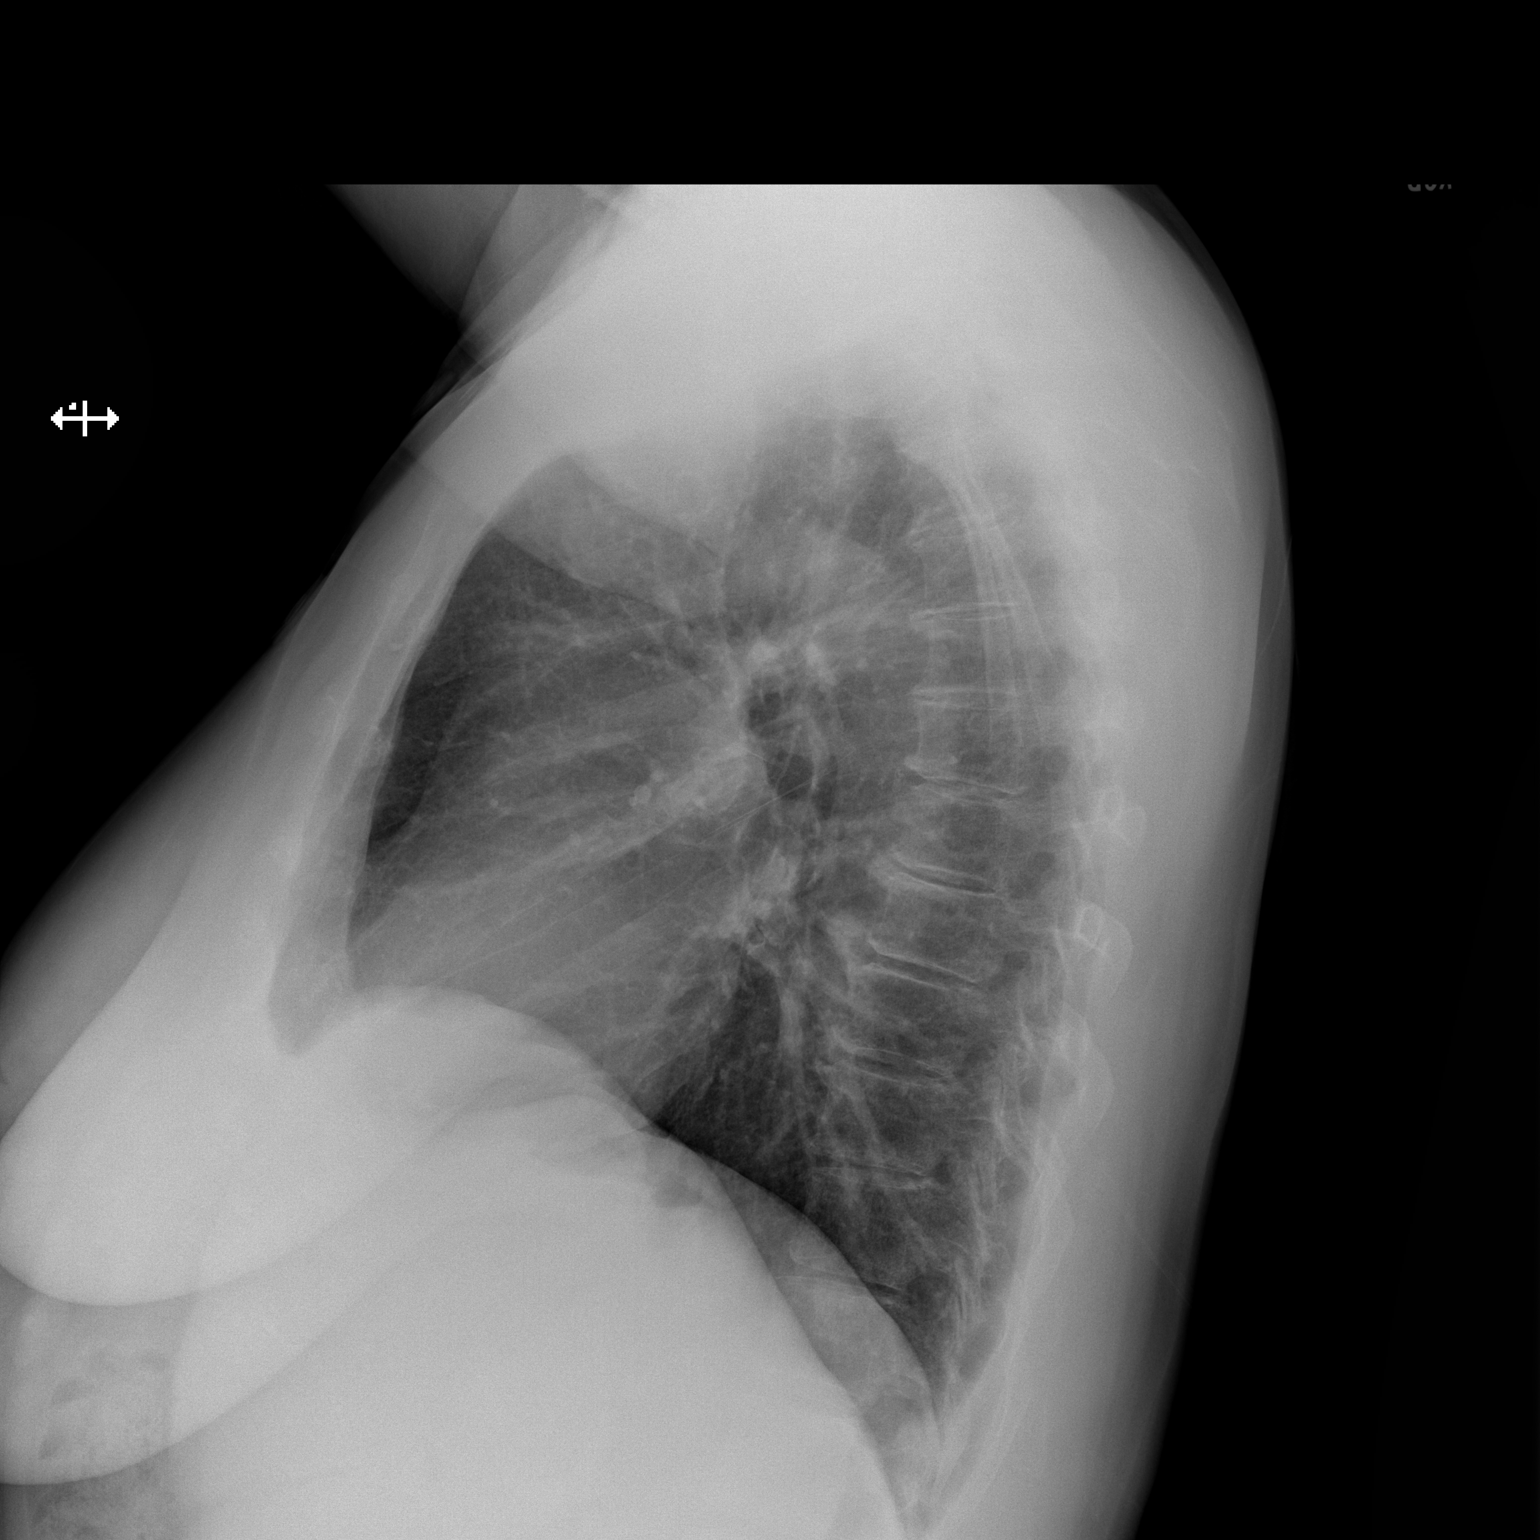

[2 of 2 positions shown; findings below may reference images not displayed]

FINDINGS: The cardiomediastinal silhouette is within normal limits. The lungs
are well inflated and clear. There is no evidence of pleural
effusion or pneumothorax. Thoracic spondylosis is noted.
IMPRESSION: No active cardiopulmonary disease.

## 2018-05-19 ENCOUNTER — Ambulatory Visit (INDEPENDENT_AMBULATORY_CARE_PROVIDER_SITE_OTHER): Payer: Medicare Other | Admitting: Neurology

## 2018-05-19 DIAGNOSIS — G4730 Sleep apnea, unspecified: Secondary | ICD-10-CM

## 2018-05-19 DIAGNOSIS — Z9989 Dependence on other enabling machines and devices: Secondary | ICD-10-CM

## 2018-05-19 DIAGNOSIS — G4700 Insomnia, unspecified: Secondary | ICD-10-CM

## 2018-05-19 DIAGNOSIS — G4733 Obstructive sleep apnea (adult) (pediatric): Secondary | ICD-10-CM | POA: Diagnosis not present

## 2018-05-28 ENCOUNTER — Telehealth: Payer: Self-pay | Admitting: Neurology

## 2018-05-28 NOTE — Addendum Note (Signed)
Addended by: Larey Seat on: 05/28/2018 08:16 AM   Modules accepted: Orders

## 2018-05-28 NOTE — Procedures (Signed)
[facility] [place], [place] [address] NAME:  Brittney Williamson                                                                           DOB: 15-Sep-1950 MEDICAL RECORD No:  [id number]                                                       DOS:  05/20/2018 REFERRING PHYSICIAN: Ivar Drape, PA STUDY PERFORMED: Home Sleep Test on Watch Pat HISTORY: Update 30 April 2018, I have the pleasure of seeing 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 After her diagnosis with sleep apnea in the SPLIT-night titration study from 13 February 2016 she had voiced an interest in the dental device and had actually spoken to Dr. Ron Parker, but financial means were not such that she could pursue this treatment. The resulting AHI was high she had severe apnea with an AHI of 45.7 slightly accentuated in supine and in REM sleep.  Her AHI became 1.3 after she was titrated to 9 cmH2O CPAP and she has therefore pursued for CPAP treatment.\  The compliance to CPAP however was spotty I do have a 3 months compliance data and for the month of November she did very well however in December she barely used the CPAP every third day and she had not used it through the month of October either.  CPAP is set at 9 cm water pressure with 3 cm EPR and the average user time is 1 hour and 23 minutes.  She does have very minor air leakage and some central apneas arising.   I would like for her to continue on CPAP and she still owns an S9 AutoSet.  The patient stated her sleep study felt not representative for her normal sleep.  Can she repeat a study?  I will arrange for a HST.  She endorsed today the Epworth Sleepiness Scale at 6 points, the fatigue severity scale of 0 the geriatric depression score at 1 out of 15.  STUDY RESULTS:  Total Recording Time: 7 h 2 mins; Total Sleep Time: 6 h 22 min. Total  Apnea/Hypopnea Index (AHI): 24.1 /h; RDI: 25.4 /h; REM AHI: 36.9 /h. Average Oxygen Saturation: 93 %; Lowest Oxygen Desaturation: 78 %.  Total Time in Oxygen Saturation below 89 %: 19.3 minutes.  Average Heart Rate: 64 bpm (between 49 and 89 bpm). IMPRESSION: Apnea is still present, moderate- severe at AHI 24.1/h REM accentuated and therefor best treated with CPAP.  RECOMMENDATION: Resume CPAP, I will order new supplies and CPAP auto -machine shall be set from 5-15 cm water with 3 cm EPR, interface of choice and heated humidity.  I certify that I have reviewed the raw data recording prior to the issuance of this report in accordance with the standards of the American Academy of Sleep Medicine (AASM). Larey Seat, M.D.  05-28-2018    Medical Director of  [facility], accredited by the AASM. Diplomat of the ABPN and ABSM.

## 2018-05-28 NOTE — Telephone Encounter (Signed)
-----   Message from Larey Seat, MD sent at 05/28/2018  8:16 AM EST ----- IMPRESSION: Apnea is still present, moderate- severe at AHI  24.1/h REM accentuated and therefor best treated with CPAP.  RECOMMENDATION: Resume CPAP, I will order new supplies and CPAP  auto -machine shall be set from 5-15 cm water with 3 cm EPR,  interface of choice and heated humidity.

## 2018-05-28 NOTE — Telephone Encounter (Signed)
Called the patient to make her aware of the sleep study results. I reviewed the findings with her and she is established with [facility]. Patient currently has a machine and she thinks it is auto titration capable. Advised the patient this machine was set up in 2013 and I'm not sure how accurate that is. Advised the patient I will send the order over to [facility] for her and have them evaluate that further.Pt verbalized understanding. Order sent and patient already has a apt established in march and will follow up at that time.

## 2018-06-10 ENCOUNTER — Ambulatory Visit: Payer: Medicare Other | Admitting: Endocrinology

## 2018-07-07 DIAGNOSIS — Z1231 Encounter for screening mammogram for malignant neoplasm of breast: Secondary | ICD-10-CM | POA: Diagnosis not present

## 2018-07-10 DIAGNOSIS — H1045 Other chronic allergic conjunctivitis: Secondary | ICD-10-CM | POA: Diagnosis not present

## 2018-08-06 ENCOUNTER — Telehealth: Payer: Self-pay | Admitting: Neurology

## 2018-08-06 NOTE — Telephone Encounter (Signed)
Called the patient to inform them that our office has placed new protocols in place for our office visits. Due to Covid 19 our office is reducing our number of office visits in order to minimize the risk to our patients and healthcare providers.Our office is now providing the capability to offer the patients virtual visits at this time.  LVM informing of above and asked her to call back to discuss.  The patient was just seen in [place] and we completed  Sleep study 05/19/2018 which informed her to restart using her CPAP machine. I need to know if the patient is currently using her machine and if so we would need to get access for a download for the machine. If we are able to get this between now and next wed apt then we could keep the apt it not we may need to push apt out. Will discuss when the pt calls back.

## 2018-08-11 NOTE — Telephone Encounter (Signed)
Called the patient and advised her that our office is not bringing the patient's in at this time. She wanted to cancel cause she went out of town and didn't take the machine. We pushed apt out til June 16,2020 to allow more time to use and be compliant.

## 2018-08-13 ENCOUNTER — Ambulatory Visit: Payer: Medicare Other | Admitting: Neurology

## 2018-10-07 ENCOUNTER — Ambulatory Visit: Payer: Self-pay | Admitting: Neurology

## 2018-10-07 ENCOUNTER — Other Ambulatory Visit: Payer: Self-pay

## 2018-10-07 ENCOUNTER — Encounter: Payer: Self-pay | Admitting: Family Medicine

## 2018-10-07 ENCOUNTER — Ambulatory Visit (INDEPENDENT_AMBULATORY_CARE_PROVIDER_SITE_OTHER): Payer: Medicare Other | Admitting: Family Medicine

## 2018-10-07 VITALS — BP 160/86 | HR 75 | Temp 97.1°F | Ht 63.5 in | Wt 169.6 lb

## 2018-10-07 DIAGNOSIS — G4733 Obstructive sleep apnea (adult) (pediatric): Secondary | ICD-10-CM

## 2018-10-07 DIAGNOSIS — Z9989 Dependence on other enabling machines and devices: Secondary | ICD-10-CM | POA: Diagnosis not present

## 2018-10-07 NOTE — Progress Notes (Addendum)
PATIENT: Brittney Williamson DOB: 02-Oct-1950  REASON FOR VISIT: follow up HISTORY FROM: patient  Chief Complaint  Patient presents with  . Follow-up    Rm 5, alone  . cpap     HISTORY OF PRESENT ILLNESS: Today 10/07/18 Brittney Williamson is a 68 y.o. female here today for follow up of OSA on CPAP. She reports that she is continuing to adjust to CPAP therapy.  She is trying to use her machine each night.  Download report dated 09/07/2018 through 10/06/2018 reveals that over the last 30 days she is used her machine 27 out of 30 days for compliance of 90%.  19 days she used her machine greater than 4 hours for compliance of 63%.  Average usage was about 4 hours and 56 minutes.  AHI was 4.2 on 5 to 15 cm of water and an EPR level of 3.  There was no significant leak.  She does note benefit from CPAP therapy when used regularly.   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 After her diagnosis with sleep apnea out in the split-night titration study from 13 February 2016 she had voiced an interest in the dental device and had actually spoken to Dr. Ron Parker, but financial means were not such that she could pursue this treatment her AHI was rather high she had severe apnea with an AHI of 45.7 slightly accentuated in supine and in rem sleep.  Her AHI became 1.3 after she was titrated to 9 cmH2O CPAP and she has therefore pursued for CPAP treatment she endorsed today the Epworth Sleepiness Scale at 6 points the fatigue severity scale of 0 the geriatric depression score at 1 out of 15.  Good compliance to CPAP however was spotty I do have a 3 months compliance data and for the month of November she did very well however in December she barely uses the CPAP every third day and she  had not used it through the month of October either.  CPAP is set at 9 cm water pressure with 3 cm EPR and the average user time is 1 hour and 23 minutes.  She does have very minor air leakage and some central apneas arising.  I would like for her to continue on CPAP and she still owns an S9 AutoSet.  The patient stated her sleep study felt not representative for her normal sleep.  Can she repeat a study?  I will arrange for a HST.    UPDATE 01/23/2017.CM BrittneyWilliamson, 68 year old female returns for follow-up for CPAP compliance. She has severe obstructive sleep apnea and periodic limb movements. CPAP compliance data dated 12/24/2016-01/22/2017 shows greater than 4 hours for 17 days or 57%. Average usage 5 hours 5 minutes. Set pressure 9 cm EPR 3AHI 6.8.ESS 8. FSS 22. She returns for reevaluation.   07/02/16 CMMs.Brittney Williamson, 68 year old female returns for CPAP compliance after having a split-night titration sleep study on 02/13/2016. Her last compliance report greater than 4 hours 63% for 19 days. Her compliance today is 25 out of 30 days 83% compliance greater than 4 hours compliance 67%.   Her sleep  shows severe obstructive sleep apnea and periodic limb movements of sleep unknown  clinical significance. She is on  CPAP at 9  cm. She is advised to avoid sedative hypnotics which may worsen her sleep apnea also alcohol and tobacco. She claims  today that  sometimes when she goes to bed she is too tired and forgets to use the appliance. She denies having any illnesses but she could not use.Average usage for days used 5 hours 23 minutes at 9 cm of pressure EPR level 3. AHI for 3.1.  She returns for reevaluation    REVIEW OF SYSTEMS: Out of a complete 14 system review of symptoms, the patient complains only of the following symptoms, and all other reviewed systems are negative.  Epworth sleepiness scale: 5   ALLERGIES: Allergies  Allergen Reactions  . Penicillins Itching  . Trimox [Amoxicillin  Trihydrate] Itching    HOME MEDICATIONS: Outpatient Medications Prior to Visit  Medication Sig Dispense Refill  . B Complex Vitamins (B COMPLEX 1 PO) Take 1 tablet by mouth daily.     Marland Kitchen ELDERBERRY PO Take 2 tablets by mouth daily.    . fenofibrate 160 MG tablet Take 1 tablet (160 mg total) by mouth daily. 90 tablet 3  . Melatonin 5 MG TABS Take 5 mg by mouth at bedtime.     . Multiple Vitamin (MULTIVITAMIN) tablet Take 1 tablet by mouth daily.    . naproxen sodium (ANAPROX) 220 MG tablet Take 220 mg by mouth 2 (two) times daily as needed (for pain).    . vitamin B-12 (CYANOCOBALAMIN) 100 MCG tablet Take 100 mcg by mouth daily.    . vitamin C (ASCORBIC ACID) 500 MG tablet Take 500 mg by mouth daily.    Marland Kitchen VITAMIN D PO Take 20,000 Units by mouth daily.     Marland Kitchen zinc gluconate 50 MG tablet Take 50 mg by mouth daily.     No facility-administered medications prior to visit.     PAST MEDICAL HISTORY: Past Medical History:  Diagnosis Date  . OSA on CPAP     PAST SURGICAL HISTORY: History reviewed. No pertinent surgical history.  FAMILY HISTORY: Family History  Adopted: Yes    SOCIAL HISTORY: Social History   Socioeconomic History  . Marital status: Single    Spouse name: Not on file  . Number of children: Not on file  . Years of education: Not on file  . Highest education level: Not on file  Occupational History  . Not on file  Social Needs  . Financial resource strain: Not on file  . Food insecurity    Worry: Not on file    Inability: Not on file  . Transportation needs    Medical: Not on file    Non-medical: Not on file  Tobacco Use  . Smoking status: Never Smoker  . Smokeless tobacco: Never Used  Substance and Sexual Activity  . Alcohol use: Yes    Comment: occ  . Drug use: Never  . Sexual activity: Not Currently  Lifestyle  . Physical activity    Days per week: Not on file    Minutes per session: Not on file  . Stress: Not on file  Relationships  . Social  Herbalist on phone: Not on file    Gets together: Not on file    Attends religious service: Not on file    Active member of club or organization: Not on file    Attends meetings of clubs or organizations: Not on file    Relationship status: Not on file  . Intimate partner violence    Fear  of current or ex partner: Not on file    Emotionally abused: Not on file    Physically abused: Not on file    Forced sexual activity: Not on file  Other Topics Concern  . Not on file  Social History Narrative  . Not on file      PHYSICAL EXAM  Vitals:   10/07/18 0825  BP: (!) 160/86  Pulse: 75  Temp: (!) 97.1 F (36.2 C)  Weight: 169 lb 9.6 oz (76.9 kg)  Height: 5' 3.5" (1.613 m)   Body mass index is 29.57 kg/m.  Generalized: Well developed, in no acute distress  Cardiology: normal rate and rhythm, no murmur noted Neurological examination  Mentation: Alert oriented to time, place, history taking. Follows all commands speech and language fluent Cranial nerve II-XII: Pupils were equal round reactive to light. Head turning and shoulder shrug  were normal and symmetric. Motor: The motor testing reveals 5 over 5 strength of all 4 extremities. Good symmetric motor tone is noted throughout.  Neck circumference 14.25"  DIAGNOSTIC DATA (LABS, IMAGING, TESTING) - I reviewed patient records, labs, notes, testing and imaging myself where available.  No flowsheet data found.   Lab Results  Component Value Date   WBC 6.3 09/05/2016   HGB 14.7 09/05/2016   HCT 45.4 09/05/2016   MCV 85.2 09/05/2016   PLT 231 09/05/2016      Component Value Date/Time   NA 140 11/08/2017 1402   K 4.6 11/08/2017 1402   CL 101 11/08/2017 1402   CO2 26 11/08/2017 1402   GLUCOSE 87 11/08/2017 1402   GLUCOSE 99 09/05/2016 1614   BUN 13 11/08/2017 1402   CREATININE 0.80 11/08/2017 1402   CREATININE 0.80 11/18/2015 0811   CALCIUM 9.6 11/08/2017 1402   PROT 7.2 04/14/2018 0919   PROT 7.2  11/08/2017 1402   ALBUMIN 4.4 04/14/2018 0919   ALBUMIN 4.4 11/08/2017 1402   AST 21 04/14/2018 0919   ALT 25 04/14/2018 0919   ALKPHOS 83 04/14/2018 0919   BILITOT 0.4 04/14/2018 0919   BILITOT 0.3 11/08/2017 1402   GFRNONAA 77 11/08/2017 1402   GFRNONAA 78 11/18/2015 0811   GFRAA 89 11/08/2017 1402   GFRAA >89 11/18/2015 0811   Lab Results  Component Value Date   CHOL 238 (H) 04/14/2018   HDL 38.30 (L) 04/14/2018   LDLCALC Comment 11/08/2017   LDLDIRECT 57.0 04/14/2018   TRIG 339.0 (H) 04/14/2018   CHOLHDL 6 04/14/2018   No results found for: HGBA1C No results found for: VITAMINB12 Lab Results  Component Value Date   TSH 0.848 11/08/2017       ASSESSMENT AND PLAN 68 y.o. year old female  has a past medical history of OSA on CPAP. here with     ICD-10-CM   1. OSA on CPAP  G47.33    Z99.89      She continues to adjust to her CPAP therapy.  Compliance download shows suboptimal compliance.  I have encouraged her to continue using her CPAP every night and for greater than 4 hours each night.  We have discussed risk of untreated sleep apnea.  I have advised a 37-month follow-up to assess compliance.  She verbalizes understanding and agreement with this plan.  No orders of the defined types were placed in this encounter.    No orders of the defined types were placed in this encounter.     I spent 15 minutes with the patient. 50% of this time was spent  counseling and educating patient on plan of care and medications.    Debbora Presto, FNP-C 10/07/2018, 10:00 AM [facility] [address], [place], [place] [address] [phone]

## 2018-10-07 NOTE — Patient Instructions (Signed)
Continue to use CPAP nightly and greater than 4 hours each night.   Return in 3 months for follow up compliance report.   CPAP and BPAP Information CPAP and BPAP are methods of helping a person breathe with the use of air pressure. CPAP stands for "continuous positive airway pressure." BPAP stands for "bi-level positive airway pressure." In both methods, air is blown through your nose or mouth and into your air passages to help you breathe well. CPAP and BPAP use different amounts of pressure to blow air. With CPAP, the amount of pressure stays the same while you breathe in and out. With BPAP, the amount of pressure is increased when you breathe in (inhale) so that you can take larger breaths. Your health care provider will recommend whether CPAP or BPAP would be more helpful for you. Why are CPAP and BPAP treatments used? CPAP or BPAP can be helpful if you have:  Sleep apnea.  Chronic obstructive pulmonary disease (COPD).  Heart failure.  Medical conditions that weaken the muscles of the chest including muscular dystrophy, or neurological diseases such as amyotrophic lateral sclerosis (ALS).  Other problems that cause breathing to be weak, abnormal, or difficult. CPAP is most commonly used for obstructive sleep apnea (OSA) to keep the airways from collapsing when the muscles relax during sleep. How is CPAP or BPAP administered? Both CPAP and BPAP are provided by a small machine with a flexible plastic tube that attaches to a plastic mask. You wear the mask. Air is blown through the mask into your nose or mouth. The amount of pressure that is used to blow the air can be adjusted on the machine. Your health care provider will determine the pressure setting that should be used based on your individual needs. When should CPAP or BPAP be used? In most cases, the mask only needs to be worn during sleep. Generally, the mask needs to be worn throughout the night and during any daytime naps. People  with certain medical conditions may also need to wear the mask at other times when they are awake. Follow instructions from your health care provider about when to use the machine. What are some tips for using the mask?   Because the mask needs to be snug, some people feel trapped or closed-in (claustrophobic) when first using the mask. If you feel this way, you may need to get used to the mask. One way to do this is by holding the mask loosely over your nose or mouth and then gradually applying the mask more snugly. You can also gradually increase the amount of time that you use the mask.  Masks are available in various types and sizes. Some fit over your mouth and nose while others fit over just your nose. If your mask does not fit well, talk with your health care provider about getting a different one.  If you are using a mask that fits over your nose and you tend to breathe through your mouth, a chin strap may be applied to help keep your mouth closed.  The CPAP and BPAP machines have alarms that may sound if the mask comes off or develops a leak.  If you have trouble with the mask, it is very important that you talk with your health care provider about finding a way to make the mask easier to tolerate. Do not stop using the mask. Stopping the use of the mask could have a negative impact on your health. What are some tips for  using the machine?  Place your CPAP or BPAP machine on a secure table or stand near an electrical outlet.  Know where the on/off switch is located on the machine.  Follow instructions from your health care provider about how to set the pressure on your machine and when you should use it.  Do not eat or drink while the CPAP or BPAP machine is on. Food or fluids could get pushed into your lungs by the pressure of the CPAP or BPAP.  Do not smoke. Tobacco smoke residue can damage the machine.  For home use, CPAP and BPAP machines can be rented or purchased through home  health care companies. Many different brands of machines are available. Renting a machine before purchasing may help you find out which particular machine works well for you.  Keep the CPAP or BPAP machine and attachments clean. Ask your health care provider for specific instructions. Get help right away if:  You have redness or open areas around your nose or mouth where the mask fits.  You have trouble using the CPAP or BPAP machine.  You cannot tolerate wearing the CPAP or BPAP mask.  You have pain, discomfort, and bloating in your abdomen. Summary  CPAP and BPAP are methods of helping a person breathe with the use of air pressure.  Both CPAP and BPAP are provided by a small machine with a flexible plastic tube that attaches to a plastic mask.  If you have trouble with the mask, it is very important that you talk with your health care provider about finding a way to make the mask easier to tolerate. This information is not intended to replace advice given to you by your health care provider. Make sure you discuss any questions you have with your health care provider. Document Released: 01/06/2004 Document Revised: 12/10/2017 Document Reviewed: 02/27/2016 Elsevier Interactive Patient Education  2019 [employer] Sleep Apnea Sleep apnea affects breathing during sleep. It causes breathing to stop for a short time or to become shallow. It can also increase the risk of:  Heart attack.  Stroke.  Being very overweight (obese).  Diabetes.  Heart failure.  Irregular heartbeat. The goal of treatment is to help you breathe normally again. What are the causes? There are three kinds of sleep apnea:  Obstructive sleep apnea. This is caused by a blocked or collapsed airway.  Central sleep apnea. This happens when the brain does not send the right signals to the muscles that control breathing.  Mixed sleep apnea. This is a combination of obstructive and central sleep apnea. The most  common cause of this condition is a collapsed or blocked airway. This can happen if:  Your throat muscles are too relaxed.  Your tongue and tonsils are too large.  You are overweight.  Your airway is too small. What increases the risk?  Being overweight.  Smoking.  Having a small airway.  Being older.  Being female.  Drinking alcohol.  Taking medicines to calm yourself (sedatives or tranquilizers).  Having family members with the condition. What are the signs or symptoms?  Trouble staying asleep.  Being sleepy or tired during the day.  Getting angry a lot.  Loud snoring.  Headaches in the morning.  Not being able to focus your mind (concentrate).  Forgetting things.  Less interest in sex.  Mood swings.  Personality changes.  Feelings of sadness (depression).  Waking up a lot during the night to pee (urinate).  Dry mouth.  Sore throat. How  is this diagnosed?  Your medical history.  A physical exam.  A test that is done when you are sleeping (sleep study). The test is most often done in a sleep lab but may also be done at home. How is this treated?   Sleeping on your side.  Using a medicine to get rid of mucus in your nose (decongestant).  Avoiding the use of alcohol, medicines to help you relax, or certain pain medicines (narcotics).  Losing weight, if needed.  Changing your diet.  Not smoking.  Using a machine to open your airway while you sleep, such as: ? An oral appliance. This is a mouthpiece that shifts your lower jaw forward. ? A CPAP device. This device blows air through a mask when you breathe out (exhale). ? An EPAP device. This has valves that you put in each nostril. ? A BPAP device. This device blows air through a mask when you breathe in (inhale) and breathe out.  Having surgery if other treatments do not work. It is important to get treatment for sleep apnea. Without treatment, it can lead to:  High blood pressure.   Coronary artery disease.  In men, not being able to have an erection (impotence).  Reduced thinking ability. Follow these instructions at home: Lifestyle  Make changes that your doctor recommends.  Eat a healthy diet.  Lose weight if needed.  Avoid alcohol, medicines to help you relax, and some pain medicines.  Do not use any products that contain nicotine or tobacco, such as cigarettes, e-cigarettes, and chewing tobacco. If you need help quitting, ask your doctor. General instructions  Take over-the-counter and prescription medicines only as told by your doctor.  If you were given a machine to use while you sleep, use it only as told by your doctor.  If you are having surgery, make sure to tell your doctor you have sleep apnea. You may need to bring your device with you.  Keep all follow-up visits as told by your doctor. This is important. Contact a doctor if:  The machine that you were given to use during sleep bothers you or does not seem to be working.  You do not get better.  You get worse. Get help right away if:  Your chest hurts.  You have trouble breathing in enough air.  You have an uncomfortable feeling in your back, arms, or stomach.  You have trouble talking.  One side of your body feels weak.  A part of your face is hanging down. These symptoms may be an emergency. Do not wait to see if the symptoms will go away. Get medical help right away. Call your local emergency services (911 in the [place] Do not drive yourself to the hospital. Summary  This condition affects breathing during sleep.  The most common cause is a collapsed or blocked airway.  The goal of treatment is to help you breathe normally while you sleep. This information is not intended to replace advice given to you by your health care provider. Make sure you discuss any questions you have with your health care provider. Document Released: 01/17/2008 Document Revised: 12/03/2017 Document  Reviewed: 12/03/2017 Elsevier Interactive Patient Education  [employer].

## 2018-10-28 ENCOUNTER — Ambulatory Visit: Payer: Self-pay

## 2018-10-28 ENCOUNTER — Other Ambulatory Visit: Payer: Self-pay

## 2018-10-28 ENCOUNTER — Ambulatory Visit (INDEPENDENT_AMBULATORY_CARE_PROVIDER_SITE_OTHER): Payer: Medicare Other | Admitting: Orthopedic Surgery

## 2018-10-28 ENCOUNTER — Ambulatory Visit (INDEPENDENT_AMBULATORY_CARE_PROVIDER_SITE_OTHER): Payer: Medicare Other

## 2018-10-28 ENCOUNTER — Encounter: Payer: Self-pay | Admitting: Orthopedic Surgery

## 2018-10-28 VITALS — Ht 63.5 in | Wt 169.6 lb

## 2018-10-28 DIAGNOSIS — M25561 Pain in right knee: Secondary | ICD-10-CM

## 2018-10-28 DIAGNOSIS — M25562 Pain in left knee: Secondary | ICD-10-CM

## 2018-10-30 ENCOUNTER — Encounter: Payer: Self-pay | Admitting: Orthopedic Surgery

## 2018-10-30 DIAGNOSIS — M25562 Pain in left knee: Secondary | ICD-10-CM

## 2018-10-30 DIAGNOSIS — M25561 Pain in right knee: Secondary | ICD-10-CM

## 2018-10-30 NOTE — Progress Notes (Signed)
Office Visit Note   Patient: Brittney Williamson           Date of Birth: 12-Apr-1951           MRN: [id number] Visit Date: 10/28/2018              Requested by: Rutherford Guys, MD [address] [place],  [place] [address] PCP: Rutherford Guys, MD  Chief Complaint  Patient presents with   Right Knee - Pain   Left Knee - Pain      HPI: Patient is a 68 year old woman who complains of stress and strain pain in both knees when walking.  She states that when she stands for 7 hours at [employer] she has a stabbing pain in the knee.  She states she has to lift 15 pounds at work.  She has been taking Aleve.  Assessment & Plan: Visit Diagnoses:  1. Pain in both knees, unspecified chronicity     Plan: Plan: Both knees were injected with steroid she tolerated this well discussed that if she gets good relief from the steroid injection we could consider a hyaluronic acid injection.  She will call if she wants to schedule this.  Discussed importance of muscle strengthening.  Follow-Up Instructions: Return if symptoms worsen or fail to improve.   Ortho Exam  Patient is alert, oriented, no adenopathy, well-dressed, normal affect, normal respiratory effort. Examination patient has a normal gait she has crepitation with range of motion of both knees in the patellofemoral joint.  The medial and lateral joint lines are minimally tender to palpation collaterals are cruciates are stable there is no effusion.  Imaging: No results found. No images are attached to the encounter.  Labs: Lab Results  Component Value Date   LABORGA Normal Upper Respiratory Flora 07/25/2011   LABORGA No Beta Hemolytic Streptococci Isolated 07/25/2011     Lab Results  Component Value Date   ALBUMIN 4.4 04/14/2018   ALBUMIN 4.4 11/08/2017   ALBUMIN 4.1 11/18/2015    No results found for: MG No results found for: VD25OH  No results found for: PREALBUMIN CBC EXTENDED Latest Ref Rng & Units 09/05/2016  11/18/2015  WBC 4.0 - 10.5 K/uL 6.3 5.4  RBC 3.87 - 5.11 MIL/uL 5.33(H) 5.33(H)  HGB 12.0 - 15.0 g/dL 14.7 14.3  HCT 36.0 - 46.0 % 45.4 44.0  PLT 150 - 400 K/uL 231 239     Body mass index is 29.57 kg/m.  Orders:  Orders Placed This Encounter  Procedures   XR Knee 1-2 Views Left   XR Knee 1-2 Views Right   No orders of the defined types were placed in this encounter.    Procedures: Large Joint Inj: bilateral knee on 10/30/2018 11:06 AM Indications: pain and diagnostic evaluation Details: 22 G 1.5 in needle, anteromedial approach  Arthrogram: No  Outcome: tolerated well, no immediate complications Procedure, treatment alternatives, risks and benefits explained, specific risks discussed. Consent was given by the patient. Immediately prior to procedure a time out was called to verify the correct patient, procedure, equipment, support staff and site/side marked as required. Patient was prepped and draped in the usual sterile fashion.      Clinical Data: No additional findings.  ROS:  All other systems negative, except as noted in the HPI. Review of Systems  Objective: Vital Signs: Ht 5' 3.5" (1.613 m)    Wt 169 lb 9.6 oz (76.9 kg)    BMI 29.57 kg/m   Specialty Comments:  No specialty comments available.  PMFS History: Patient Active Problem List   Diagnosis Date Noted   Osteopenia 11/10/2017   OSA on CPAP 01/12/2016   Insomnia with sleep apnea 01/12/2016   Essential hypertension 11/30/2015   Hyperlipidemia 11/30/2015   Past Medical History:  Diagnosis Date   OSA on CPAP     Family History  Adopted: Yes    History reviewed. No pertinent surgical history. Social History   Occupational History   Not on file  Tobacco Use   Smoking status: Never Smoker   Smokeless tobacco: Never Used  Substance and Sexual Activity   Alcohol use: Yes    Comment: occ   Drug use: Never   Sexual activity: Not Currently

## 2018-12-15 DIAGNOSIS — H2513 Age-related nuclear cataract, bilateral: Secondary | ICD-10-CM | POA: Diagnosis not present

## 2018-12-15 DIAGNOSIS — Z8669 Personal history of other diseases of the nervous system and sense organs: Secondary | ICD-10-CM | POA: Diagnosis not present

## 2019-01-06 NOTE — Patient Instructions (Addendum)
Continue CPAP nightly and for greater than 4 hours each night.   Follow up in 6 months  Living With Sleep Apnea Sleep apnea is a condition in which breathing pauses or becomes shallow during sleep. Sleep apnea is most commonly caused by a collapsed or blocked airway. People with sleep apnea snore loudly and have times when they gasp and stop breathing for 10 seconds or more during sleep. This happens over and over during the night. This disrupts your sleep and keeps your body from getting the rest that it needs, which can cause tiredness and lack of energy (fatigue) during the day. The breaks in breathing also interrupt the deep sleep that you need to feel rested. Even if you do not completely wake up from the gaps in breathing, your sleep may not be restful. You may also have a headache in the morning and low energy during the day, and you may feel anxious or depressed. How can sleep apnea affect me? Sleep apnea increases your chances of extreme tiredness during the day (daytime fatigue). It can also increase your risk for health conditions, such as:  Heart attack.  Stroke.  Diabetes.  Heart failure.  Irregular heartbeat.  High blood pressure. If you have daytime fatigue as a result of sleep apnea, you may be more likely to:  Perform poorly at school or work.  Fall asleep while driving.  Have difficulty with attention.  Develop depression or anxiety.  Become severely overweight (obese).  Have sexual dysfunction. What actions can I take to manage sleep apnea? Sleep apnea treatment   If you were given a device to open your airway while you sleep, use it only as told by your health care provider. You may be given: ? An oral appliance. This is a custom-made mouthpiece that shifts your lower jaw forward. ? A continuous positive airway pressure (CPAP) device. This device blows air through a mask when you breathe out (exhale). ? A nasal expiratory positive airway pressure (EPAP)  device. This device has valves that you put into each nostril. ? A bi-level positive airway pressure (BPAP) device. This device blows air through a mask when you breathe in (inhale) and breathe out (exhale).  You may need surgery if other treatments do not work for you. Sleep habits  Go to sleep and wake up at the same time every day. This helps set your internal clock (circadian rhythm) for sleeping. ? If you stay up later than usual, such as on weekends, try to get up in the morning within 2 hours of your normal wake time.  Try to get at least 7-9 hours of sleep each night.  Stop computer, tablet, and mobile phone use a few hours before bedtime.  Do not take long naps during the day. If you nap, limit it to 30 minutes.  Have a relaxing bedtime routine. Reading or listening to music may relax you and help you sleep.  Use your bedroom only for sleep. ? Keep your television and computer out of your bedroom. ? Keep your bedroom cool, dark, and quiet. ? Use a supportive mattress and pillows.  Follow your health care provider's instructions for other changes to sleep habits. Nutrition  Do not eat heavy meals in the evening.  Do not have caffeine in the later part of the day. The effects of caffeine can last for more than 5 hours.  Follow your health care provider's or dietitian's instructions for any diet changes. Lifestyle      Do  not drink alcohol before bedtime. Alcohol can cause you to fall asleep at first, but then it can cause you to wake up in the middle of the night and have trouble getting back to sleep.  Do not use any products that contain nicotine or tobacco, such as cigarettes and e-cigarettes. If you need help quitting, ask your health care provider. Medicines  Take over-the-counter and prescription medicines only as told by your health care provider.  Do not use over-the-counter sleep medicine. You can become dependent on this medicine, and it can make sleep apnea  worse.  Do not use medicines, such as sedatives and narcotics, unless told by your health care provider. Activity  Exercise on most days, but avoid exercising in the evening. Exercising near bedtime can interfere with sleeping.  If possible, spend time outside every day. Natural light helps regulate your circadian rhythm. General information  Lose weight if you need to, and maintain a healthy weight.  Keep all follow-up visits as told by your health care provider. This is important.  If you are having surgery, make sure to tell your health care provider that you have sleep apnea. You may need to bring your device with you. Where to find more information Learn more about sleep apnea and daytime fatigue from:  American Sleep Association: sleepassociation.Platter: sleepfoundation.org  National Heart, Lung, and Blood Institute: https://www.hartman-hill.biz/ Summary  Sleep apnea can cause daytime fatigue and other serious health conditions.  Both sleep apnea and daytime fatigue can be bad for your health and well-being.  You may need to wear a device while sleeping to help keep your airway open.  If you are having surgery, make sure to tell your health care provider that you have sleep apnea. You may need to bring your device with you.  Making changes to sleep habits, diet, lifestyle, and activity can help you manage sleep apnea. This information is not intended to replace advice given to you by your health care provider. Make sure you discuss any questions you have with your health care provider. Document Released: 07/04/2017 Document Revised: 08/01/2018 Document Reviewed: 07/04/2017 Elsevier Patient Education  [place].

## 2019-01-06 NOTE — Progress Notes (Signed)
PATIENT: Brittney Williamson DOB: 1950-12-23  REASON FOR VISIT: follow up HISTORY FROM: patient  Chief Complaint  Patient presents with  . Follow-up    3 mon f/u. Alone, Rm 2. Patient mentioned that she doesnt sleep for more than 4 hours at a time.     HISTORY OF PRESENT ILLNESS: Today 01/07/19 Brittney Williamson is a 68 y.o. female here today for follow up of OSA on CPAP. She has continued to work on compliance.  She admits that she does not sleep for more than 4 hours at night.  She does take naps during the day.  She denies any concerns with CPAP therapy.  Compliance report dated 10/09/2018 through 01/06/2019 reveals that she is using CPAP 80 out of the last 90 days for compliance of 89%.  57 days she used CPAP greater than 4 hours for compliance of 63%.  Average usage was 4 hours and 46 minutes.  AHI was slightly elevated at 7.1 on 5 to 15 cm of water and an EPR of 3.  There was no significant leak.  HISTORY: (copied from my note on 10/07/2018)  Brittney Williamson is a 68 y.o. female here today for follow up of OSA on CPAP. She reports that she is continuing to adjust to CPAP therapy.  She is trying to use her machine each night.  Download report dated 09/07/2018 through 10/06/2018 reveals that over the last 30 days she is used her machine 27 out of 30 days for compliance of 90%.  19 days she used her machine greater than 4 hours for compliance of 63%.  Average usage was about 4 hours and 56 minutes.  AHI was 4.2 on 5 to 15 cm of water and an EPR level of 3.  There was no significant leak.  She does note benefit from CPAP therapy when used regularly.   HISTORY: (copied from Dr Dohmeier's note on 04/30/2018)  Update from 30 April 2018, I have the pleasure of seeing Mrs. 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 After her diagnosis with sleep apnea out in the split-night titration  study from 13 February 2016 she had voiced an interest in the dental device and had actually spoken to Dr. Ron Parker, but financial means were not such that she could pursue this treatment her AHI was rather high she had severe apnea with an AHI of 45.7 slightly accentuated in supine and in rem sleep. Her AHI became 1.3 after she was titrated to 9 cmH2O CPAP and she has therefore pursued for CPAP treatment she endorsed today the Epworth Sleepiness Scale at 6 points the fatigue severity scale of 0 the geriatric depression score at 1 out of 15.  Good compliance to CPAP however was spotty I do have a 3 months compliance data and for the month of November she did very well however in December she barely uses the CPAP every third day and she had not used it through the month of October either. CPAP is set at 9 cm water pressure with 3 cm EPR and the average user time is 1 hour and 23 minutes. She does have very minor air leakage and some central apneas arising. I would like for her to continue on CPAP and she still owns an S9 AutoSet. The patient stated her sleep study felt not representative for her normal sleep.  Can she  repeat a study? I will arrange for a HST.    UPDATE 01/23/2017.CM Brittney Williamson, 68 year old female returns for follow-up for CPAP compliance. She has severe obstructive sleep apnea and periodic limb movements. CPAP compliance data dated 12/24/2016-01/22/2017 shows greater than 4 hours for 17 days or 57%. Average usage 5 hours 5 minutes. Set pressure 9 cm EPR 3AHI 6.8.ESS 8. FSS 22. She returns for reevaluation.   07/02/16 Brittney Williamson, 68 year old female returns for CPAP compliance after having a split-night titration sleep study on 02/13/2016. Her last compliance report greater than 4 hours 63% for 19 days. Her compliance today is 25 out of 30 days 83% compliance greater than 4 hours compliance 67%.  Her sleep shows severe obstructive sleep apnea and periodic limb movements of sleep  unknown clinical significance. She is on CPAP at 9 cm. She is advised to avoid sedative hypnotics which may worsen her sleep apnea also alcohol and tobacco. She claims today that sometimes when she goes to bed she is too tired and forgets to use the appliance. She denies having any illnesses but she could not use.Average usage for days used 5 hours 23 minutes at 9 cm of pressure EPR level 3. AHI for 3.1. She returns for reevaluation   REVIEW OF SYSTEMS: Out of a complete 14 system review of symptoms, the patient complains only of the following symptoms, activity change, fatigue, apnea, food allergy, joint pain, lower back pain, bruise/bleed easily, tingling and her ears and all other reviewed systems are negative.  Epworth sleepiness scale: 7 Fatigue severity scale: 17  ALLERGIES: Allergies  Allergen Reactions  . Penicillins Itching  . Trimox [Amoxicillin Trihydrate] Itching    HOME MEDICATIONS: Outpatient Medications Prior to Visit  Medication Sig Dispense Refill  . B Complex Vitamins (B COMPLEX 1 PO) Take 1 tablet by mouth daily.     Marland Kitchen ELDERBERRY PO Take 2 tablets by mouth daily.    . fenofibrate 160 MG tablet Take 1 tablet (160 mg total) by mouth daily. 90 tablet 3  . FOLIC ACID PO Take [id number] mcg by mouth daily.    . Melatonin 5 MG TABS Take 10 mg by mouth at bedtime.     . naproxen sodium (ANAPROX) 220 MG tablet Take 440 mg by mouth as needed (for pain).     . vitamin B-12 (CYANOCOBALAMIN) 1000 MCG tablet Take 1,000 mcg by mouth daily.    Marland Kitchen VITAMIN D PO Take 20,000 Units by mouth daily.     Marland Kitchen zinc gluconate 50 MG tablet Take 57.5 mg by mouth daily.     . Multiple Vitamin (MULTIVITAMIN) tablet Take 1 tablet by mouth daily.    . vitamin B-12 (CYANOCOBALAMIN) 100 MCG tablet Take 100 mcg by mouth daily.    . vitamin C (ASCORBIC ACID) 500 MG tablet Take 500 mg by mouth daily.     No facility-administered medications prior to visit.     PAST MEDICAL HISTORY: Past Medical History:   Diagnosis Date  . OSA on CPAP     PAST SURGICAL HISTORY: History reviewed. No pertinent surgical history.  FAMILY HISTORY: Family History  Adopted: Yes    SOCIAL HISTORY: Social History   Socioeconomic History  . Marital status: Single    Spouse name: Not on file  . Number of children: Not on file  . Years of education: Not on file  . Highest education level: Not on file  Occupational History  . Not on file  Social Needs  . Financial resource strain: Not  on file  . Food insecurity    Worry: Not on file    Inability: Not on file  . Transportation needs    Medical: Not on file    Non-medical: Not on file  Tobacco Use  . Smoking status: Never Smoker  . Smokeless tobacco: Never Used  Substance and Sexual Activity  . Alcohol use: Yes    Comment: occ  . Drug use: Never  . Sexual activity: Not Currently  Lifestyle  . Physical activity    Days per week: Not on file    Minutes per session: Not on file  . Stress: Not on file  Relationships  . Social Herbalist on phone: Not on file    Gets together: Not on file    Attends religious service: Not on file    Active member of club or organization: Not on file    Attends meetings of clubs or organizations: Not on file    Relationship status: Not on file  . Intimate partner violence    Fear of current or ex partner: Not on file    Emotionally abused: Not on file    Physically abused: Not on file    Forced sexual activity: Not on file  Other Topics Concern  . Not on file  Social History Narrative  . Not on file      PHYSICAL EXAM  Vitals:   01/07/19 0744  BP: (!) 142/92  Pulse: 78  Temp: (!) 96.9 F (36.1 C)  TempSrc: Oral  Weight: 171 lb (77.6 kg)  Height: 5\' 5"  (1.651 m)   Body mass index is 28.46 kg/m.  Generalized: Well developed, in no acute distress  Mallampati 3+, neck circ 14.25" Neurological examination  Mentation: Alert oriented to time, place, history taking. Follows all commands  speech and language fluent Cranial nerve II-XII: Pupils were equal round reactive to light. Extraocular movements were full, visual field were full on confrontational test. Facial sensation and strength were normal. Uvula tongue midline. Head turning and shoulder shrug  were normal and symmetric. Motor: The motor testing reveals 5 over 5 strength of all 4 extremities. Good symmetric motor tone is noted throughout.  Sensory: Sensory testing is intact to soft touch on all 4 extremities. No evidence of extinction is noted.  Coordination: Cerebellar testing reveals good finger-nose-finger and heel-to-shin bilaterally.  Gait and station: Gait is normal.   DIAGNOSTIC DATA (LABS, IMAGING, TESTING) - I reviewed patient records, labs, notes, testing and imaging myself where available.  No flowsheet data found.   Lab Results  Component Value Date   WBC 6.3 09/05/2016   HGB 14.7 09/05/2016   HCT 45.4 09/05/2016   MCV 85.2 09/05/2016   PLT 231 09/05/2016      Component Value Date/Time   NA 140 11/08/2017 1402   K 4.6 11/08/2017 1402   CL 101 11/08/2017 1402   CO2 26 11/08/2017 1402   GLUCOSE 87 11/08/2017 1402   GLUCOSE 99 09/05/2016 1614   BUN 13 11/08/2017 1402   CREATININE 0.80 11/08/2017 1402   CREATININE 0.80 11/18/2015 0811   CALCIUM 9.6 11/08/2017 1402   PROT 7.2 04/14/2018 0919   PROT 7.2 11/08/2017 1402   ALBUMIN 4.4 04/14/2018 0919   ALBUMIN 4.4 11/08/2017 1402   AST 21 04/14/2018 0919   ALT 25 04/14/2018 0919   ALKPHOS 83 04/14/2018 0919   BILITOT 0.4 04/14/2018 0919   BILITOT 0.3 11/08/2017 1402   GFRNONAA 77 11/08/2017 1402  GFRNONAA 78 11/18/2015 0811   GFRAA 89 11/08/2017 1402   GFRAA >89 11/18/2015 0811   Lab Results  Component Value Date   CHOL 238 (H) 04/14/2018   HDL 38.30 (L) 04/14/2018   LDLCALC Comment 11/08/2017   LDLDIRECT 57.0 04/14/2018   TRIG 339.0 (H) 04/14/2018   CHOLHDL 6 04/14/2018   No results found for: HGBA1C No results found for:  VITAMINB12 Lab Results  Component Value Date   TSH 0.848 11/08/2017    ASSESSMENT AND PLAN 68 y.o. year old female  has a past medical history of OSA on CPAP. here with     ICD-10-CM   1. OSA on CPAP  G47.33    Z99.89     Cecille Rubin continues to work on compliance.  She reports that she is bad to fall asleep at night.  She does stay up late talking on the phone.  We have discussed several options to ensure that CPAP is placed prior to falling asleep.  I have advised that she consider setting an alarm.  She may also place CPAP about 30 minutes prior to feeling that she may fall asleep.  AHI is slightly elevated today.  Review of previous visits reveals that AHI is typically around 4.  Per compliance data, pressures seem to be appropriate.  We will continue monitoring for now.  She is aware of risk of untreated sleep apnea.  She was encouraged to continue using CPAP nightly and for greater than 4 hours each night.  I would like to see her back in about 6 months to assess AHI and compliance.  She verbalizes understanding and agreement with this plan.   No orders of the defined types were placed in this encounter.    No orders of the defined types were placed in this encounter.     I spent 15 minutes with the patient. 50% of this time was spent counseling and educating patient on plan of care and medications.    Debbora Presto, FNP-C 01/07/2019, 8:26 AM [facility] [address], [place], [place] [address] [phone]

## 2019-01-07 ENCOUNTER — Other Ambulatory Visit: Payer: Self-pay

## 2019-01-07 ENCOUNTER — Ambulatory Visit (INDEPENDENT_AMBULATORY_CARE_PROVIDER_SITE_OTHER): Payer: Medicare Other | Admitting: Family Medicine

## 2019-01-07 ENCOUNTER — Encounter: Payer: Self-pay | Admitting: Family Medicine

## 2019-01-07 VITALS — BP 142/92 | HR 78 | Temp 96.9°F | Ht 65.0 in | Wt 171.0 lb

## 2019-01-07 DIAGNOSIS — Z9989 Dependence on other enabling machines and devices: Secondary | ICD-10-CM | POA: Diagnosis not present

## 2019-01-07 DIAGNOSIS — G4733 Obstructive sleep apnea (adult) (pediatric): Secondary | ICD-10-CM

## 2019-01-20 ENCOUNTER — Telehealth: Payer: Self-pay | Admitting: *Deleted

## 2019-01-20 NOTE — Telephone Encounter (Signed)
Per Almyra Free, scheduled the first 8 AM appt available and she will notify the pt of upcoming appt during awv on 10/01

## 2019-01-20 NOTE — Telephone Encounter (Signed)
Patient would like to schedule an 8:00 am appointment any day with Dr. Pamella Pert first available is fine.   Patient hasn't been seen in a year

## 2019-01-22 ENCOUNTER — Ambulatory Visit: Payer: Medicare Other

## 2019-02-20 ENCOUNTER — Ambulatory Visit: Payer: Medicare Other | Admitting: Family Medicine

## 2019-03-31 DIAGNOSIS — H33311 Horseshoe tear of retina without detachment, right eye: Secondary | ICD-10-CM | POA: Diagnosis not present

## 2019-03-31 DIAGNOSIS — H2513 Age-related nuclear cataract, bilateral: Secondary | ICD-10-CM | POA: Diagnosis not present

## 2019-04-14 ENCOUNTER — Other Ambulatory Visit: Payer: Self-pay | Admitting: Orthopedic Surgery

## 2019-04-14 DIAGNOSIS — G8929 Other chronic pain: Secondary | ICD-10-CM

## 2019-04-24 DIAGNOSIS — G453 Amaurosis fugax: Secondary | ICD-10-CM

## 2019-04-24 HISTORY — DX: Amaurosis fugax: G45.3

## 2019-04-27 ENCOUNTER — Ambulatory Visit
Admission: RE | Admit: 2019-04-27 | Discharge: 2019-04-27 | Disposition: A | Discharge status: Home / Self Care | Payer: Medicare Other | Source: Ambulatory Visit | Attending: Orthopedic Surgery | Admitting: Orthopedic Surgery

## 2019-04-27 DIAGNOSIS — G8929 Other chronic pain: Secondary | ICD-10-CM

## 2019-04-27 DIAGNOSIS — M25562 Pain in left knee: Secondary | ICD-10-CM | POA: Diagnosis not present

## 2019-04-30 DIAGNOSIS — S83242A Other tear of medial meniscus, current injury, left knee, initial encounter: Secondary | ICD-10-CM | POA: Diagnosis not present

## 2019-04-30 DIAGNOSIS — M25562 Pain in left knee: Secondary | ICD-10-CM | POA: Diagnosis not present

## 2019-04-30 DIAGNOSIS — G8929 Other chronic pain: Secondary | ICD-10-CM | POA: Diagnosis not present

## 2019-05-06 ENCOUNTER — Ambulatory Visit: Payer: 59 | Attending: Internal Medicine

## 2019-05-06 DIAGNOSIS — Z20822 Contact with and (suspected) exposure to covid-19: Secondary | ICD-10-CM | POA: Diagnosis not present

## 2019-05-07 LAB — NOVEL CORONAVIRUS, NAA: SARS-CoV-2, NAA: NOT DETECTED

## 2019-05-19 DIAGNOSIS — Z6831 Body mass index (BMI) 31.0-31.9, adult: Secondary | ICD-10-CM | POA: Diagnosis not present

## 2019-05-19 DIAGNOSIS — R1032 Left lower quadrant pain: Secondary | ICD-10-CM | POA: Diagnosis not present

## 2019-05-19 DIAGNOSIS — Z124 Encounter for screening for malignant neoplasm of cervix: Secondary | ICD-10-CM | POA: Diagnosis not present

## 2019-05-19 LAB — HM PAP SMEAR: HM Pap smear: NEGATIVE

## 2019-05-27 DIAGNOSIS — M94262 Chondromalacia, left knee: Secondary | ICD-10-CM | POA: Diagnosis not present

## 2019-05-27 DIAGNOSIS — Y999 Unspecified external cause status: Secondary | ICD-10-CM | POA: Diagnosis not present

## 2019-05-27 DIAGNOSIS — G8918 Other acute postprocedural pain: Secondary | ICD-10-CM | POA: Diagnosis not present

## 2019-05-27 DIAGNOSIS — M659 Synovitis and tenosynovitis, unspecified: Secondary | ICD-10-CM | POA: Diagnosis not present

## 2019-05-27 DIAGNOSIS — S83262A Peripheral tear of lateral meniscus, current injury, left knee, initial encounter: Secondary | ICD-10-CM | POA: Diagnosis not present

## 2019-05-27 DIAGNOSIS — S83272A Complex tear of lateral meniscus, current injury, left knee, initial encounter: Secondary | ICD-10-CM | POA: Diagnosis not present

## 2019-05-27 DIAGNOSIS — S83232A Complex tear of medial meniscus, current injury, left knee, initial encounter: Secondary | ICD-10-CM | POA: Diagnosis not present

## 2019-05-27 DIAGNOSIS — X58XXXA Exposure to other specified factors, initial encounter: Secondary | ICD-10-CM | POA: Diagnosis not present

## 2019-05-27 HISTORY — PX: KNEE ARTHROSCOPY WITH MEDIAL MENISECTOMY: SHX5651

## 2019-05-28 ENCOUNTER — Telehealth: Payer: Self-pay | Admitting: Family Medicine

## 2019-05-28 ENCOUNTER — Ambulatory Visit: Payer: Medicare Other | Admitting: Physical Therapy

## 2019-05-28 NOTE — Telephone Encounter (Signed)
Pt is needing to speak to RN about her compliance with her cpap that her insurance is asking about. Please advise.

## 2019-05-29 ENCOUNTER — Ambulatory Visit: Payer: Medicare Other | Admitting: Physical Therapy

## 2019-06-01 NOTE — Telephone Encounter (Signed)
I called pt.  She stated she has new [employer] insurance.  They are stating that she got new machine asking for compliance.  I moved her appt up to AL/NP at 06-02-19 at 1000.  She was more comfortable coming in vs mychart video.  Report to BD/CMA for tomorrow.

## 2019-06-02 ENCOUNTER — Ambulatory Visit (INDEPENDENT_AMBULATORY_CARE_PROVIDER_SITE_OTHER): Payer: Medicare Other | Admitting: Family Medicine

## 2019-06-02 ENCOUNTER — Encounter: Payer: Self-pay | Admitting: Physical Therapy

## 2019-06-02 ENCOUNTER — Encounter: Payer: Self-pay | Admitting: Family Medicine

## 2019-06-02 ENCOUNTER — Ambulatory Visit: Payer: Medicare Other | Attending: Orthopedic Surgery | Admitting: Physical Therapy

## 2019-06-02 ENCOUNTER — Other Ambulatory Visit: Payer: Self-pay

## 2019-06-02 VITALS — BP 124/76 | HR 105 | Temp 96.8°F | Ht 65.0 in | Wt 172.4 lb

## 2019-06-02 DIAGNOSIS — G4733 Obstructive sleep apnea (adult) (pediatric): Secondary | ICD-10-CM | POA: Diagnosis not present

## 2019-06-02 DIAGNOSIS — M6281 Muscle weakness (generalized): Secondary | ICD-10-CM | POA: Diagnosis not present

## 2019-06-02 DIAGNOSIS — R262 Difficulty in walking, not elsewhere classified: Secondary | ICD-10-CM | POA: Diagnosis not present

## 2019-06-02 DIAGNOSIS — M25562 Pain in left knee: Secondary | ICD-10-CM | POA: Diagnosis not present

## 2019-06-02 DIAGNOSIS — M25662 Stiffness of left knee, not elsewhere classified: Secondary | ICD-10-CM | POA: Diagnosis not present

## 2019-06-02 DIAGNOSIS — Z9989 Dependence on other enabling machines and devices: Secondary | ICD-10-CM

## 2019-06-02 NOTE — Progress Notes (Signed)
PATIENT: Brittney Williamson DOB: 29-Sep-1950  REASON FOR VISIT: follow up HISTORY FROM: patient  Chief Complaint  Patient presents with  . Follow-up    Initial cpap f/u. Alone. New room. No new concerns at this time.      HISTORY OF PRESENT ILLNESS: Today 06/02/19 Brittney Williamson is a 69 y.o. female here today for follow up for OSA on CPAP. She is doing very well with CPAP therapy. She has gotten accustomed to using her machine every night and for at least 4 hours. She recently had left knee surgery and is recovering well. She feels great today and without concerns.   Compliance report dated 05/02/2019 through 05/31/2019 reveals that she has used CPAP 29 of the last 30 days for compliance of 97%.  27 of the last 30 days she used CPAP greater than 4 hours for compliance of 90%.  Average usage was 6 hours and 18 minutes.  Residual AHI was 3.4 on 5 to 15 cm of water and an EPR of 1.  There was no significant leak noted.  HISTORY: (copied from my note on 01/07/2019)  Brittney Williamson is a 69 y.o. female here today for follow up of OSA on CPAP. She has continued to work on compliance.  She admits that she does not sleep for more than 4 hours at night.  She does take naps during the day.  She denies any concerns with CPAP therapy.  Compliance report dated 10/09/2018 through 01/06/2019 reveals that she is using CPAP 80 out of the last 90 days for compliance of 89%.  57 days she used CPAP greater than 4 hours for compliance of 63%.  Average usage was 4 hours and 46 minutes.  AHI was slightly elevated at 7.1 on 5 to 15 cm of water and an EPR of 3.  There was no significant leak.  HISTORY: (copied from my note on 10/07/2018)  Brittney Williamson a 69 y.o.femalehere today for follow up of OSA on CPAP. She reports that she is continuing to adjust to CPAP therapy. She is trying to use her machine each night. Download report dated 09/07/2018 through 10/06/2018 reveals that over the last 30 days she is  used her machine 27 out of 30 days for compliance of 90%. 19 days she used her machine greater than 4 hours for compliance of 63%. Average usage was about 4 hours and 56 minutes. AHI was 4.2 on 5 to 15 cm of water and an EPR level of 3. There was no significant leak. She does note benefit from CPAP therapy when used regularly.   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 After her diagnosis with sleep apnea out in the split-night titration study from 13 February 2016 she had voiced an interest in the dental device and had actually spoken to Dr. Ron Parker, but financial means were not such that she could pursue this treatment her AHI was rather high she had severe apnea with an AHI of 45.7 slightly accentuated in supine and in rem sleep. Her AHI became 1.3 after she was titrated to 9 cmH2O CPAP and she has therefore pursued for CPAP treatment she endorsed today the Epworth Sleepiness Scale at 6 points the fatigue severity scale of 0 the geriatric  depression score at 1 out of 15.  Good compliance to CPAP however was spotty I do have a 3 months compliance data and for the month of November she did very well however in December she barely uses the CPAP every third day and she had not used it through the month of October either. CPAP is set at 9 cm water pressure with 3 cm EPR and the average user time is 1 hour and 23 minutes. She does have very minor air leakage and some central apneas arising. I would like for her to continue on CPAP and she still owns an S9 AutoSet. The patient stated her sleep study felt not representative for her normal sleep.  Can she repeat a study? I will arrange for a HST.    UPDATE 01/23/2017.CM Brittney Williamson, 69 year old female returns for  follow-up for CPAP compliance. She has severe obstructive sleep apnea and periodic limb movements. CPAP compliance data dated 12/24/2016-01/22/2017 shows greater than 4 hours for 17 days or 57%. Average usage 5 hours 5 minutes. Set pressure 9 cm EPR 3AHI 6.8.ESS 8. FSS 22. She returns for reevaluation.   07/02/16 CMMs.Brittney Williamson, 69 year old female returns for CPAP compliance after having a split-night titration sleep study on 02/13/2016. Her last compliance report greater than 4 hours 63% for 19 days. Her compliance today is 25 out of 30 days 83% compliance greater than 4 hours compliance 67%.  Her sleep shows severe obstructive sleep apnea and periodic limb movements of sleep unknown clinical significance. She is on CPAP at 9 cm. She is advised to avoid sedative hypnotics which may worsen her sleep apnea also alcohol and tobacco. She claims today that sometimes when she goes to bed she is too tired and forgets to use the appliance. She denies having any illnesses but she could not use.Average usage for days used 5 hours 23 minutes at 9 cm of pressure EPR level 3. AHI for 3.1. She returns for reevaluation   REVIEW OF SYSTEMS: Out of a complete 14 system review of symptoms, the patient complains only of the following symptoms, none and all other reviewed systems are negative.  ESS: 7 FSS:19  ALLERGIES: Allergies  Allergen Reactions  . Penicillins Itching  . Trimox [Amoxicillin Trihydrate] Itching    HOME MEDICATIONS: Outpatient Medications Prior to Visit  Medication Sig Dispense Refill  . acyclovir (ZOVIRAX) 400 MG tablet Take 400 mg by mouth every 8 (eight) hours.    . B Complex Vitamins (B COMPLEX 1 PO) Take 1 tablet by mouth daily.     Marland Kitchen ELDERBERRY PO Take 2 tablets by mouth daily.    . Melatonin 5 MG TABS Take 10 mg by mouth at bedtime.     . Multiple Vitamin (MULTIVITAMIN) tablet Take 1 tablet by mouth daily.    . naproxen sodium (ANAPROX) 220 MG tablet Take 440 mg by mouth as  needed (for pain).     . vitamin B-12 (CYANOCOBALAMIN) 1000 MCG tablet Take 1,000 mcg by mouth daily.    Marland Kitchen VITAMIN D PO Take 20,000 Units by mouth daily.     Marland Kitchen zinc gluconate 50 MG tablet Take 50 mg by mouth daily as needed.     . fenofibrate 160 MG tablet Take 1 tablet (160 mg total) by mouth daily. 90 tablet 3  . FOLIC ACID PO Take [id number] mcg by mouth daily.     No facility-administered medications prior to visit.    PAST MEDICAL HISTORY: Past Medical History:  Diagnosis Date  . OSA  on CPAP     PAST SURGICAL HISTORY: History reviewed. No pertinent surgical history.  FAMILY HISTORY: Family History  Adopted: Yes    SOCIAL HISTORY: Social History   Socioeconomic History  . Marital status: Single    Spouse name: Not on file  . Number of children: Not on file  . Years of education: Not on file  . Highest education level: Not on file  Occupational History  . Not on file  Tobacco Use  . Smoking status: Never Smoker  . Smokeless tobacco: Never Used  Substance and Sexual Activity  . Alcohol use: Yes    Comment: occ  . Drug use: Never  . Sexual activity: Not Currently  Other Topics Concern  . Not on file  Social History Narrative  . Not on file   Social Determinants of Health   Financial Resource Strain:   . Difficulty of Paying Living Expenses: Not on file  Food Insecurity:   . Worried About Charity fundraiser in the Last Year: Not on file  . Ran Out of Food in the Last Year: Not on file  Transportation Needs:   . Lack of Transportation (Medical): Not on file  . Lack of Transportation (Non-Medical): Not on file  Physical Activity:   . Days of Exercise per Week: Not on file  . Minutes of Exercise per Session: Not on file  Stress:   . Feeling of Stress : Not on file  Social Connections:   . Frequency of Communication with Friends and Family: Not on file  . Frequency of Social Gatherings with Friends and Family: Not on file  . Attends Religious Services: Not on  file  . Active Member of Clubs or Organizations: Not on file  . Attends Archivist Meetings: Not on file  . Marital Status: Not on file  Intimate Partner Violence:   . Fear of Current or Ex-Partner: Not on file  . Emotionally Abused: Not on file  . Physically Abused: Not on file  . Sexually Abused: Not on file      PHYSICAL EXAM  Vitals:   06/02/19 0959  BP: 124/76  Pulse: (!) 105  Temp: (!) 96.8 F (36 C)  TempSrc: Oral  Weight: 172 lb 6.4 oz (78.2 kg)  Height: 5\' 5"  (1.651 m)   Body mass index is 28.69 kg/m.  Generalized: Well developed, in no acute distress  Cardiology: normal rate and rhythm, no murmur noted Respiratory: Clear to auscultation bilaterally  Neurological examination  Mentation: Alert oriented to time, place, history taking. Follows all commands speech and language fluent Cranial nerve II-XII: Pupils were equal round reactive to light. Extraocular movements were full, visual field were full  Motor: The motor testing reveals 5 over 5 strength of all 4 extremities. Good symmetric motor tone is noted throughout.  Gait and station: Gait is normal.   DIAGNOSTIC DATA (LABS, IMAGING, TESTING) - I reviewed patient records, labs, notes, testing and imaging myself where available.  No flowsheet data found.   Lab Results  Component Value Date   WBC 6.3 09/05/2016   HGB 14.7 09/05/2016   HCT 45.4 09/05/2016   MCV 85.2 09/05/2016   PLT 231 09/05/2016      Component Value Date/Time   NA 140 11/08/2017 1402   K 4.6 11/08/2017 1402   CL 101 11/08/2017 1402   CO2 26 11/08/2017 1402   GLUCOSE 87 11/08/2017 1402   GLUCOSE 99 09/05/2016 1614   BUN 13 11/08/2017 1402  CREATININE 0.80 11/08/2017 1402   CREATININE 0.80 11/18/2015 0811   CALCIUM 9.6 11/08/2017 1402   PROT 7.2 04/14/2018 0919   PROT 7.2 11/08/2017 1402   ALBUMIN 4.4 04/14/2018 0919   ALBUMIN 4.4 11/08/2017 1402   AST 21 04/14/2018 0919   ALT 25 04/14/2018 0919   ALKPHOS 83  04/14/2018 0919   BILITOT 0.4 04/14/2018 0919   BILITOT 0.3 11/08/2017 1402   GFRNONAA 77 11/08/2017 1402   GFRNONAA 78 11/18/2015 0811   GFRAA 89 11/08/2017 1402   GFRAA >89 11/18/2015 0811   Lab Results  Component Value Date   CHOL 238 (H) 04/14/2018   HDL 38.30 (L) 04/14/2018   LDLCALC Comment 11/08/2017   LDLDIRECT 57.0 04/14/2018   TRIG 339.0 (H) 04/14/2018   CHOLHDL 6 04/14/2018   No results found for: HGBA1C No results found for: VITAMINB12 Lab Results  Component Value Date   TSH 0.848 11/08/2017       ASSESSMENT AND PLAN 69 y.o. year old female  has a past medical history of OSA on CPAP. here with     ICD-10-CM   1. OSA on CPAP  G47.33    Z99.89     Kaileah is doing very well with CPAP therapy.  She is using her machine every night and for at least 4 hours every night.  Compliance report reveals excellent compliance.  She was encouraged to continue using CPAP nightly and for greater than 4 hours each night.  She will follow-up with [place] in 1 year, sooner if needed.  She verbalizes understanding and agreement with this plan.    No orders of the defined types were placed in this encounter.    No orders of the defined types were placed in this encounter.     I spent 15 minutes with the patient. 50% of this time was spent counseling and educating patient on plan of care and medications.    Debbora Presto, FNP-C 06/02/2019, 10:32 AM [facility] [address] [place], [place] [address] [phone]

## 2019-06-02 NOTE — Therapy (Signed)
[place], [place], [address] Phone: [phone]   Fax:  [phone]  Physical Therapy Evaluation  Patient Details  Name: Brittney Williamson MRN: [id number] Date of Birth: Aug 07, 1950 Referring Provider (PT): Vickey Huger, MD   Encounter Date: 06/02/2019  PT End of Session - 06/02/19 1512    Visit Number  1    Number of Visits  13    Date for PT Re-Evaluation  07/17/19    Authorization Type  MCR    PT Start Time  1502    PT Stop Time  1537    PT Time Calculation (min)  35 min    Activity Tolerance  Patient tolerated treatment well    Behavior During Therapy  [facility] for tasks assessed/performed       Past Medical History:  Diagnosis Date  . Mitral valve prolapse 1993  . OSA on CPAP     Past Surgical History:  Procedure Laterality Date  . KNEE ARTHROSCOPY WITH MEDIAL MENISECTOMY Left 05/27/2019  . WRIST SURGERY Right 2017    There were no vitals filed for this visit.   Subjective Assessment - 06/02/19 1515    Subjective  Got my stitches out about an hour ago. I am a little woozy. I am using my mom's cane. Not taking anything since Saturday. Have to stand for a minute before I start walking.    Patient Stated Goals  back to exercise. garden, eat well    Currently in Pain?  Yes    Pain Score  2     Pain Location  Knee    Pain Orientation  Left    Pain Descriptors / Indicators  Sore   stiff   Aggravating Factors   weight bearing    Pain Relieving Factors  ice         [facility] PT Assessment - 06/02/19 0001      Assessment   Medical Diagnosis  s/p Lt knee menisectomy    Referring Provider (PT)  Vickey Huger, MD    Onset Date/Surgical Date  05/27/19    Prior Therapy  no      Precautions   Precautions  None      Restrictions   Weight Bearing Restrictions  No      Balance Screen   Has the patient fallen in the past 6 months  No      [place] residence     Living Arrangements  Alone    Additional Comments  5 steps at front door      Prior Function   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, up to 7 hrs      Cognition   Overall Cognitive Status  Within Functional Limits for tasks assessed      Observation/Other Assessments   Focus on Therapeutic Outcomes (FOTO)   44% limited      Sensation   Additional Comments  WFL      ROM / Strength   AROM / PROM / Strength  AROM;Strength      AROM   AROM Assessment Site  Knee    Right/Left Knee  Left    Left Knee Extension  -4    Left Knee Flexion  108      Strength   Strength Assessment Site  Hip;Knee    Right/Left Hip  Right;Left    Left Hip Flexion  4+/5    Left Hip ABduction  4-/5    Right/Left Knee  Right;Left    Right Knee Flexion  5/5    Right Knee Extension  5/5    Left Knee Flexion  3+/5    Left Knee Extension  3+/5      Palpation   Palpation comment  TTP around Lt knee      Ambulation/Gait   Gait Comments  antalgic, flat foot, lacking full knee extension                Objective measurements completed on examination: See above findings.      [facility] Adult PT Treatment/Exercise - 06/02/19 0001      Exercises   Exercises  Knee/Hip      Knee/Hip Exercises: Standing   Gait Training  heel-toe             PT Education - 06/02/19 1945    Education Details  anatomy of condition, POC, HEP, exercise form/rationale    Person(s) Educated  Patient    Methods  Explanation;Demonstration;Tactile cues;Verbal cues    Comprehension  Verbalized understanding;Returned demonstration;Verbal cues required;Tactile cues required;Need further instruction          PT Long Term Goals - 06/02/19 1539      PT LONG TERM GOAL #1   Title  Pt will be independent in techniques to avoid 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    Baseline  has not returned to work at this time    Time  6    Period  Weeks    Status  New    Target Date  07/17/19      PT LONG TERM GOAL #2    Title  pt will demo proper squat and lift technique to grab objects from the floor    Baseline  will review as appropriate    Time  6    Period  Weeks    Status  New    Target Date  07/17/19      PT LONG TERM GOAL #3   Title  pt will be able to garden as previously able    Baseline  unable at eval    Time  6    Period  Weeks    Status  New    Target Date  07/17/19      PT LONG TERM GOAL #4   Title  up/down from floor indepedently    Baseline  unable at eval    Time  6    Period  Weeks    Status  New    Target Date  07/17/19      PT LONG TERM GOAL #5   Title  gross LE strength 5/5    Baseline  see flowsheet    Time  6    Period  Weeks    Status  New    Target Date  07/17/19             Plan - 06/02/19 1538    Clinical Impression Statement  Pt presents to PT 1 week s/p Lt knee menisectomy. She hasd good strength and is on the right path for ROM. Corrected gait pattern today which she tolerated well. Asked her to continue icing frequently. I do not feel it is safe for her to use SPC as she is leaning on it when she fatigues. will benefit from skilled PT in order to meet functional goals.    Examination-Activity Limitations  Locomotion Level;Transfers;Bend;Sit;Carry;Sleep;Squat;Stairs;Stand    Examination-Participation Restrictions  Meal Prep;Driving;Other  Stability/Clinical Decision Making  Stable/Uncomplicated    Clinical Decision Making  Low    Rehab Potential  Good    PT Frequency  2x / week    PT Duration  6 weeks    PT Treatment/Interventions  ADLs/Self Care Home Management;Cryotherapy;Electrical Stimulation;Gait training;Moist Heat;Stair training;Functional mobility training;Therapeutic activities;Therapeutic exercise;Balance training;Neuromuscular re-education;Manual techniques;Patient/family education;Passive range of motion;Dry needling;Taping    PT Next Visit Plan  [facility] hip strengthening all directions, check ankle strength    PT Home Exercise Plan  LAQ,  heel-toe gait, ice    Consulted and Agree with Plan of Care  Patient       Patient will benefit from skilled therapeutic intervention in order to improve the following deficits and impairments:  Abnormal gait, Decreased range of motion, Difficulty walking, Decreased activity tolerance, Pain, Increased muscle spasms, Improper body mechanics, Impaired flexibility, Decreased mobility, Decreased strength, Increased edema  Visit Diagnosis: Acute pain of left knee - Plan: PT plan of care cert/re-cert  Stiffness of left knee, not elsewhere classified - Plan: PT plan of care cert/re-cert  Difficulty in walking, not elsewhere classified - Plan: PT plan of care cert/re-cert  Muscle weakness (generalized) - Plan: PT plan of care cert/re-cert     Problem List Patient Active Problem List   Diagnosis Date Noted  . Osteopenia 11/10/2017  . OSA on CPAP 01/12/2016  . Insomnia with sleep apnea 01/12/2016  . Essential hypertension 11/30/2015  . Hyperlipidemia 11/30/2015   Marnesha Gagen C. Lander Eslick PT, DPT 06/02/19 7:59 PM   [place], [place], [address] Phone: [phone]   Fax:  [phone]  Name: Brittney Williamson MRN: [id number] Date of Birth: Jul 02, 1950

## 2019-06-02 NOTE — Patient Instructions (Addendum)
Please continue using your CPAP regularly. While your insurance requires that you use CPAP at least 4 hours each night on 70% of the nights, I recommend, that you not skip any nights and use it throughout the night if you can. Getting used to CPAP and staying with the treatment long term does take time and patience and discipline. Untreated obstructive sleep apnea when it is moderate to severe can have an adverse impact on cardiovascular health and raise her risk for heart disease, arrhythmias, hypertension, congestive heart failure, stroke and diabetes. Untreated obstructive sleep apnea causes sleep disruption, nonrestorative sleep, and sleep deprivation. This can have an impact on your day to day functioning and cause daytime sleepiness and impairment of cognitive function, memory loss, mood disturbance, and problems focussing. Using CPAP regularly can improve these symptoms.  Follow up in 1 year  CPAP and BPAP Information CPAP and BPAP are methods of helping a person breathe with the use of air pressure. CPAP stands for "continuous positive airway pressure." BPAP stands for "bi-level positive airway pressure." In both methods, air is blown through your nose or mouth and into your air passages to help you breathe well. CPAP and BPAP use different amounts of pressure to blow air. With CPAP, the amount of pressure stays the same while you breathe in and out. With BPAP, the amount of pressure is increased when you breathe in (inhale) so that you can take larger breaths. Your health care provider will recommend whether CPAP or BPAP would be more helpful for you. Why are CPAP and BPAP treatments used? CPAP or BPAP can be helpful if you have:  Sleep apnea.  Chronic obstructive pulmonary disease (COPD).  Heart failure.  Medical conditions that weaken the muscles of the chest including muscular dystrophy, or neurological diseases such as amyotrophic lateral sclerosis (ALS).  Other problems that cause  breathing to be weak, abnormal, or difficult. CPAP is most commonly used for obstructive sleep apnea (OSA) to keep the airways from collapsing when the muscles relax during sleep. How is CPAP or BPAP administered? Both CPAP and BPAP are provided by a small machine with a flexible plastic tube that attaches to a plastic mask. You wear the mask. Air is blown through the mask into your nose or mouth. The amount of pressure that is used to blow the air can be adjusted on the machine. Your health care provider will determine the pressure setting that should be used based on your individual needs. When should CPAP or BPAP be used? In most cases, the mask only needs to be worn during sleep. Generally, the mask needs to be worn throughout the night and during any daytime naps. People with certain medical conditions may also need to wear the mask at other times when they are awake. Follow instructions from your health care provider about when to use the machine. What are some tips for using the mask?   Because the mask needs to be snug, some people feel trapped or closed-in (claustrophobic) when first using the mask. If you feel this way, you may need to get used to the mask. One way to do this is by holding the mask loosely over your nose or mouth and then gradually applying the mask more snugly. You can also gradually increase the amount of time that you use the mask.  Masks are available in various types and sizes. Some fit over your mouth and nose while others fit over just your nose. If your mask does  not fit well, talk with your health care provider about getting a different one.  If you are using a mask that fits over your nose and you tend to breathe through your mouth, a chin strap may be applied to help keep your mouth closed.  The CPAP and BPAP machines have alarms that may sound if the mask comes off or develops a leak.  If you have trouble with the mask, it is very important that you talk with  your health care provider about finding a way to make the mask easier to tolerate. Do not stop using the mask. Stopping the use of the mask could have a negative impact on your health. What are some tips for using the machine?  Place your CPAP or BPAP machine on a secure table or stand near an electrical outlet.  Know where the on/off switch is located on the machine.  Follow instructions from your health care provider about how to set the pressure on your machine and when you should use it.  Do not eat or drink while the CPAP or BPAP machine is on. Food or fluids could get pushed into your lungs by the pressure of the CPAP or BPAP.  Do not smoke. Tobacco smoke residue can damage the machine.  For home use, CPAP and BPAP machines can be rented or purchased through home health care companies. Many different brands of machines are available. Renting a machine before purchasing may help you find out which particular machine works well for you.  Keep the CPAP or BPAP machine and attachments clean. Ask your health care provider for specific instructions. Get help right away if:  You have redness or open areas around your nose or mouth where the mask fits.  You have trouble using the CPAP or BPAP machine.  You cannot tolerate wearing the CPAP or BPAP mask.  You have pain, discomfort, and bloating in your abdomen. Summary  CPAP and BPAP are methods of helping a person breathe with the use of air pressure.  Both CPAP and BPAP are provided by a small machine with a flexible plastic tube that attaches to a plastic mask.  If you have trouble with the mask, it is very important that you talk with your health care provider about finding a way to make the mask easier to tolerate. This information is not intended to replace advice given to you by your health care provider. Make sure you discuss any questions you have with your health care provider. Document Revised: 07/30/2018 Document Reviewed:  02/27/2016 Elsevier Patient Education  [place].   Sleep Apnea Sleep apnea affects breathing during sleep. It causes breathing to stop for a short time or to become shallow. It can also increase the risk of:  Heart attack.  Stroke.  Being very overweight (obese).  Diabetes.  Heart failure.  Irregular heartbeat. The goal of treatment is to help you breathe normally again. What are the causes? There are three kinds of sleep apnea:  Obstructive sleep apnea. This is caused by a blocked or collapsed airway.  Central sleep apnea. This happens when the brain does not send the right signals to the muscles that control breathing.  Mixed sleep apnea. This is a combination of obstructive and central sleep apnea. The most common cause of this condition is a collapsed or blocked airway. This can happen if:  Your throat muscles are too relaxed.  Your tongue and tonsils are too large.  You are overweight.  Your  airway is too small. What increases the risk?  Being overweight.  Smoking.  Having a small airway.  Being older.  Being female.  Drinking alcohol.  Taking medicines to calm yourself (sedatives or tranquilizers).  Having family members with the condition. What are the signs or symptoms?  Trouble staying asleep.  Being sleepy or tired during the day.  Getting angry a lot.  Loud snoring.  Headaches in the morning.  Not being able to focus your mind (concentrate).  Forgetting things.  Less interest in sex.  Mood swings.  Personality changes.  Feelings of sadness (depression).  Waking up a lot during the night to pee (urinate).  Dry mouth.  Sore throat. How is this diagnosed?  Your medical history.  A physical exam.  A test that is done when you are sleeping (sleep study). The test is most often done in a sleep lab but may also be done at home. How is this treated?   Sleeping on your side.  Using a medicine to get rid of mucus  in your nose (decongestant).  Avoiding the use of alcohol, medicines to help you relax, or certain pain medicines (narcotics).  Losing weight, if needed.  Changing your diet.  Not smoking.  Using a machine to open your airway while you sleep, such as: ? An oral appliance. This is a mouthpiece that shifts your lower jaw forward. ? A CPAP device. This device blows air through a mask when you breathe out (exhale). ? An EPAP device. This has valves that you put in each nostril. ? A BPAP device. This device blows air through a mask when you breathe in (inhale) and breathe out.  Having surgery if other treatments do not work. It is important to get treatment for sleep apnea. Without treatment, it can lead to:  High blood pressure.  Coronary artery disease.  In men, not being able to have an erection (impotence).  Reduced thinking ability. Follow these instructions at home: Lifestyle  Make changes that your doctor recommends.  Eat a healthy diet.  Lose weight if needed.  Avoid alcohol, medicines to help you relax, and some pain medicines.  Do not use any products that contain nicotine or tobacco, such as cigarettes, e-cigarettes, and chewing tobacco. If you need help quitting, ask your doctor. General instructions  Take over-the-counter and prescription medicines only as told by your doctor.  If you were given a machine to use while you sleep, use it only as told by your doctor.  If you are having surgery, make sure to tell your doctor you have sleep apnea. You may need to bring your device with you.  Keep all follow-up visits as told by your doctor. This is important. Contact a doctor if:  The machine that you were given to use during sleep bothers you or does not seem to be working.  You do not get better.  You get worse. Get help right away if:  Your chest hurts.  You have trouble breathing in enough air.  You have an uncomfortable feeling in your back, arms,  or stomach.  You have trouble talking.  One side of your body feels weak.  A part of your face is hanging down. These symptoms may be an emergency. Do not wait to see if the symptoms will go away. Get medical help right away. Call your local emergency services (911 in the [place] Do not drive yourself to the hospital. Summary  This condition affects breathing during sleep.  The most common cause is a collapsed or blocked airway.  The goal of treatment is to help you breathe normally while you sleep. This information is not intended to replace advice given to you by your health care provider. Make sure you discuss any questions you have with your health care provider. Document Revised: 01/24/2018 Document Reviewed: 12/03/2017 Elsevier Patient Education  [place].

## 2019-06-17 ENCOUNTER — Other Ambulatory Visit: Payer: Self-pay

## 2019-06-17 ENCOUNTER — Encounter: Payer: Self-pay | Admitting: Physical Therapy

## 2019-06-17 ENCOUNTER — Ambulatory Visit: Payer: Medicare Other | Admitting: Physical Therapy

## 2019-06-17 DIAGNOSIS — M25662 Stiffness of left knee, not elsewhere classified: Secondary | ICD-10-CM | POA: Diagnosis not present

## 2019-06-17 DIAGNOSIS — M6281 Muscle weakness (generalized): Secondary | ICD-10-CM | POA: Diagnosis not present

## 2019-06-17 DIAGNOSIS — M25562 Pain in left knee: Secondary | ICD-10-CM

## 2019-06-17 DIAGNOSIS — R262 Difficulty in walking, not elsewhere classified: Secondary | ICD-10-CM | POA: Diagnosis not present

## 2019-06-17 NOTE — Therapy (Signed)
[place], [place], [address] Phone: [phone]   Fax:  [phone]  Physical Therapy Treatment  Patient Details  Name: Brittney Williamson MRN: [id number] Date of Birth: 07-17-1950 Referring Provider (PT): Vickey Huger, MD   Encounter Date: 06/17/2019  PT End of Session - 06/17/19 1416    Visit Number  2    Number of Visits  13    Date for PT Re-Evaluation  07/17/19    Authorization Type  MCR    PT Start Time  1416    PT Stop Time  1500    PT Time Calculation (min)  44 min    Activity Tolerance  Patient tolerated treatment well    Behavior During Therapy  [facility] for tasks assessed/performed       Past Medical History:  Diagnosis Date  . Mitral valve prolapse 1993  . OSA on CPAP     Past Surgical History:  Procedure Laterality Date  . KNEE ARTHROSCOPY WITH MEDIAL MENISECTOMY Left 05/27/2019  . WRIST SURGERY Right 2017    There were no vitals filed for this visit.  Subjective Assessment - 06/17/19 1417    Subjective  I rode 4 hrs to [place] this morning. Iced it down when I get home. The only problem is bending my knee back.    Patient Stated Goals  back to exercise. garden, eat well    Currently in Pain?  Yes    Pain Score  2     Pain Location  Knee    Pain Orientation  Left    Pain Descriptors / Indicators  Sore    Aggravating Factors   right after getting out of car    Pain Relieving Factors  ice         [facility] PT Assessment - 06/17/19 0001      Strength   Overall Strength Comments  ankle strength gross 5/5                   [facility] Adult PT Treatment/Exercise - 06/17/19 0001      Knee/Hip Exercises: Stretches   Passive Hamstring Stretch Limitations  seated EOB    Piriformis Stretch Limitations  seated figure 4    Gastroc Stretch  Both;30 seconds;2 reps      Knee/Hip Exercises: Aerobic   Stationary Bike  5 min- took about 1 min to begin making full revolutions      Knee/Hip Exercises: Supine   Bridges Limitations  in DF with green band around knees    Straight Leg Raises  Both;20 reps    Straight Leg Raises Limitations  cues for abdominal engagment      Knee/Hip Exercises: Sidelying   Hip ABduction  Both;20 reps      Knee/Hip Exercises: Prone   Hip Extension  Both;20 reps    Hip Extension Limitations  cues for glut set             PT Education - 06/17/19 1620    Education Details  contribution of Rt knee to Lt, shoe wear    Person(s) Educated  Patient    Methods  Explanation;Handout    Comprehension  Verbalized understanding;Need further instruction          PT Long Term Goals - 06/02/19 1539      PT LONG TERM GOAL #1   Title  Pt will be independent in techniques to avoid 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    Baseline  has not returned to work at this time    Time  6    Period  Weeks    Status  New    [place] Date  07/17/19      PT LONG TERM GOAL #2   Title  pt will demo proper squat and lift technique to grab objects from the floor    Baseline  will review as appropriate    Time  6    Period  Weeks    Status  New    Target Date  07/17/19      PT LONG TERM GOAL #3   Title  pt will be able to garden as previously able    Baseline  unable at eval    Time  6    Period  Weeks    Status  New    Target Date  07/17/19      PT LONG TERM GOAL #4   Title  up/down from floor indepedently    Baseline  unable at eval    Time  6    Period  Weeks    Status  New    Target Date  07/17/19      PT LONG TERM GOAL #5   Title  gross LE strength 5/5    Baseline  see flowsheet    Time  6    Period  Weeks    Status  New    Target Date  07/17/19            Plan - 06/17/19 1615    Clinical Impression Statement  OKC strengthening presented challenge for proximal lumbopelvic musculature. Able to tell the difference when engaging core vs not engaging. Rt genu valgum with IR of LE placing increased stress on LLE.    PT  Treatment/Interventions  ADLs/Self Care Home Management;Cryotherapy;Electrical Stimulation;Gait training;Moist Heat;Stair training;Functional mobility training;Therapeutic activities;Therapeutic exercise;Balance training;Neuromuscular re-education;Manual techniques;Patient/family education;Passive range of motion;Dry needling;Taping    PT Next Visit Plan  continue gross hip strength- bike, CKC as tolerated    PT Home Exercise Plan  LAQ, heel-toe gait, ice, NRPGGRLL    Consulted and Agree with Plan of Care  Patient       Patient will benefit from skilled therapeutic intervention in order to improve the following deficits and impairments:  Abnormal gait, Decreased range of motion, Difficulty walking, Decreased activity tolerance, Pain, Increased muscle spasms, Improper body mechanics, Impaired flexibility, Decreased mobility, Decreased strength, Increased edema  Visit Diagnosis: Acute pain of left knee  Stiffness of left knee, not elsewhere classified  Difficulty in walking, not elsewhere classified  Muscle weakness (generalized)     Problem List Patient Active Problem List   Diagnosis Date Noted  . Osteopenia 11/10/2017  . OSA on CPAP 01/12/2016  . Insomnia with sleep apnea 01/12/2016  . Essential hypertension 11/30/2015  . Hyperlipidemia 11/30/2015   Shaka Zech C. Howie Rufus PT, DPT 06/17/19 4:21 PM   [facility] [address] [place], [place], [address] Phone: [phone]   Fax:  [phone]  Name: NAJIYYAH RUSSEK MRN: [id number] Date of Birth: 12-09-1950

## 2019-06-18 ENCOUNTER — Encounter: Payer: Self-pay | Admitting: Physical Therapy

## 2019-06-18 ENCOUNTER — Ambulatory Visit: Payer: Medicare Other | Admitting: Physical Therapy

## 2019-06-18 DIAGNOSIS — M6281 Muscle weakness (generalized): Secondary | ICD-10-CM | POA: Diagnosis not present

## 2019-06-18 DIAGNOSIS — M25562 Pain in left knee: Secondary | ICD-10-CM | POA: Diagnosis not present

## 2019-06-18 DIAGNOSIS — R262 Difficulty in walking, not elsewhere classified: Secondary | ICD-10-CM | POA: Diagnosis not present

## 2019-06-18 DIAGNOSIS — M25662 Stiffness of left knee, not elsewhere classified: Secondary | ICD-10-CM

## 2019-06-18 NOTE — Therapy (Signed)
[place], [place], [address] Phone: [phone]   Fax:  [phone]  Physical Therapy Treatment  Patient Details  Name: Brittney Williamson MRN: [id number] Date of Birth: 02-18-1951 Referring Provider (PT): Vickey Huger, MD   Encounter Date: 06/18/2019  PT End of Session - 06/18/19 1543    Visit Number  3    Number of Visits  13    Date for PT Re-Evaluation  07/17/19    Authorization Type  MCR    PT Start Time  1345    PT Stop Time  1435    PT Time Calculation (min)  50 min    Activity Tolerance  Patient tolerated treatment well    Behavior During Therapy  [facility] for tasks assessed/performed       Past Medical History:  Diagnosis Date  . Mitral valve prolapse 1993  . OSA on CPAP     Past Surgical History:  Procedure Laterality Date  . KNEE ARTHROSCOPY WITH MEDIAL MENISECTOMY Left 05/27/2019  . WRIST SURGERY Right 2017    There were no vitals filed for this visit.  Subjective Assessment - 06/18/19 1553    Subjective  I just took ice off LT knee, It is a little swollen.  My 5 steps to front door. the last step is a little deep.    Currently in Pain?  Yes    Pain Score  2    took an Aleve   Pain Location  Knee    Pain Orientation  Left    Pain Descriptors / Indicators  Sore    Pain Type  Acute pain   05-27-19                      [facility] Adult PT Treatment/Exercise - 06/18/19 0001      Exercises   Exercises  Knee/Hip      Knee/Hip Exercises: Stretches   Passive Hamstring Stretch Limitations  seated EOB hold 30 sec RT/LT 2 x   RT/LT 2 x each   Piriformis Stretch Limitations  seated figure 4    Gastroc Stretch  Both;30 seconds;2 reps      Knee/Hip Exercises: Aerobic   Stationary Bike  5 min- initall took 15 sec to beging full revolutions       Knee/Hip Exercises: Standing   Other Standing Knee Exercises  initial hip hinge with dowel training x 10,  DL with 15 # modified on 10  inch platform,  Initial instruction       Knee/Hip Exercises: Supine   Bridges Limitations  in DF with green band around knees    Straight Leg Raises  Both;20 reps    x 2 with VC TC for proper execution   Straight Leg Raises Limitations  cues for abdominal engagment      Knee/Hip Exercises: Sidelying   Hip ABduction  Both;20 reps      Knee/Hip Exercises: Prone   Hip Extension  Both;20 reps;2 sets   RT only ( total 40)   Hip Extension Limitations  cues for glut set      Manual Therapy   Manual Therapy  Taping    Manual therapy comments  LT knee     Kinesiotex  Edema             PT Education - 06/18/19 1622    Education Details  reinforced HEP and answered questions.  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  Pt reviewed HEP and educated on initial Hip hinge and DL modified on 10 in platform. Pt used KT tape for edema. Pt is well motivated to achieve her goals    Examination-Activity Limitations  Locomotion Level;Transfers;Bend;Sit;Carry;Sleep;Squat;Stairs;Stand    Examination-Participation Restrictions  Meal Prep;Driving;Other    Stability/Clinical Decision Making  Stable/Uncomplicated    Clinical Decision Making  Low    Rehab  Potential  Good    PT Frequency  2x / week    PT Duration  6 weeks    PT Treatment/Interventions  ADLs/Self Care Home Management;Cryotherapy;Electrical Stimulation;Gait training;Moist Heat;Stair training;Functional mobility training;Therapeutic activities;Therapeutic exercise;Balance training;Neuromuscular re-education;Manual techniques;Patient/family education;Passive range of motion;Dry needling;Taping    PT Next Visit Plan  continue gross hip strength- bike, CKC as tolerated    PT Home Exercise Plan  LAQ, heel-toe gait, ice, NRPGGRLL       Patient will benefit from skilled therapeutic intervention in order to improve the following deficits and impairments:  Abnormal gait, Decreased range of motion, Difficulty walking, Decreased activity tolerance, Pain, Increased muscle spasms, Improper body mechanics, Impaired flexibility, Decreased mobility, Decreased strength, Increased edema  Visit Diagnosis: Acute pain of left knee  Stiffness of left knee, not elsewhere classified  Difficulty in walking, not elsewhere classified  Muscle weakness (generalized)     Problem List Patient Active Problem List   Diagnosis Date Noted  . Osteopenia 11/10/2017  . OSA on CPAP 01/12/2016  . Insomnia with sleep apnea 01/12/2016  . Essential hypertension 11/30/2015  . Hyperlipidemia 11/30/2015    Brittney Williamson, PT Certified Exercise Expert for the Aging Adult  06/18/19 4:55 PM Phone: [phone] Fax: [facility] [address] [place], [place], [address] Phone: [phone]   Fax:  [phone]  Name: Brittney Williamson MRN: [id number] Date of Birth: 12-14-1950

## 2019-06-19 ENCOUNTER — Ambulatory Visit: Payer: Medicare Other | Admitting: Physical Therapy

## 2019-06-22 ENCOUNTER — Other Ambulatory Visit: Payer: Self-pay

## 2019-06-22 ENCOUNTER — Ambulatory Visit: Payer: Medicare Other | Attending: Orthopedic Surgery | Admitting: Physical Therapy

## 2019-06-22 ENCOUNTER — Encounter: Payer: Self-pay | Admitting: Physical Therapy

## 2019-06-22 DIAGNOSIS — M25562 Pain in left knee: Secondary | ICD-10-CM | POA: Diagnosis not present

## 2019-06-22 DIAGNOSIS — R262 Difficulty in walking, not elsewhere classified: Secondary | ICD-10-CM

## 2019-06-22 DIAGNOSIS — M6281 Muscle weakness (generalized): Secondary | ICD-10-CM | POA: Insufficient documentation

## 2019-06-22 DIAGNOSIS — M25662 Stiffness of left knee, not elsewhere classified: Secondary | ICD-10-CM | POA: Insufficient documentation

## 2019-06-22 NOTE — Therapy (Signed)
[place], [place], [address] Phone: [phone]   Fax:  [phone]  Physical Therapy Treatment  Patient Details  Name: Brittney Williamson MRN: [id number] Date of Birth: [birth year] Referring Provider (PT): Vickey Huger, MD   Encounter Date: 06/22/2019  PT End of Session - 06/22/19 1545    Visit Number  4    Number of Visits  13    Date for PT Re-Evaluation  07/17/19    Authorization Type  MCR    PT Start Time  [id number]    PT Stop Time  1635    PT Time Calculation (min)  52 min    Activity Tolerance  Patient tolerated treatment well    Behavior During Therapy  [facility] for tasks assessed/performed       Past Medical History:  Diagnosis Date  . Mitral valve prolapse 1993  . OSA on CPAP     Past Surgical History:  Procedure Laterality Date  . KNEE ARTHROSCOPY WITH MEDIAL MENISECTOMY Left 05/27/2019  . WRIST SURGERY Right 2017    There were no vitals filed for this visit.  Subjective Assessment - 06/22/19 1543    Subjective  I think I am overdoing it by working too many days a week- lots of sitting in the car.    Patient Stated Goals  back to exercise. garden, eat well    Currently in Pain?  Yes    Pain Score  3     Pain Location  Knee   pes anserine   Pain Orientation  Left    Pain Descriptors / Indicators  Sore                       [facility] Adult PT Treatment/Exercise - 06/22/19 0001      Knee/Hip Exercises: Stretches   Active Hamstring Stretch  Both;5 reps    Gastroc Stretch Limitations  slant board    Other Knee/Hip Stretches  long body stretch      Knee/Hip Exercises: Aerobic   Stationary Bike  5 minL1      Knee/Hip Exercises: Standing   Rocker Board Limitations  lateral static & dynamic    Other Standing Knee Exercises  tandem balance EO/EC at sink      Knee/Hip Exercises: Supine   Straight Leg Raises Limitations  SLR-lower-abd- midline x10 each 2lb      Knee/Hip  Exercises: Sidelying   Other Sidelying Knee/Hip Exercises  SL hip circles & arcs x10 each direction each leg      Manual Therapy   Manual Therapy  Soft tissue mobilization    Soft tissue mobilization  IASTM Lt hamstrings                  PT Long Term Goals - 06/18/19 1646      PT LONG TERM GOAL #1   Title  Pt will be independent in techniques to avoid 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    Time  6    Period  Weeks    Status  On-going      PT LONG TERM GOAL #2   Title  pt will demo proper squat and lift technique to grab objects from the floor    Baseline  Initial training with hip hinge and modified DL with 10 in platform    Time  6    Period  Weeks            Plan -  06/22/19 1612    Clinical Impression Statement  Added 2lb to open chain exercises which made OKC exercises much more difficult. Significant tightness in post musculature whith long drives. Asked her to use ice massage on pes anserine at home- pants unable to come over knee at clinic today.    PT Treatment/Interventions  ADLs/Self Care Home Management;Cryotherapy;Electrical Stimulation;Gait training;Moist Heat;Stair training;Functional mobility training;Therapeutic activities;Therapeutic exercise;Balance training;Neuromuscular re-education;Manual techniques;Patient/family education;Passive range of motion;Dry needling;Taping    PT Next Visit Plan  continue gross hip strength- bike, CKC as tolerated    PT Home Exercise Plan  LAQ, heel-toe gait, ice, NRPGGRLL    Consulted and Agree with Plan of Care  Patient       Patient will benefit from skilled therapeutic intervention in order to improve the following deficits and impairments:  Abnormal gait, Decreased range of motion, Difficulty walking, Decreased activity tolerance, Pain, Increased muscle spasms, Improper body mechanics, Impaired flexibility, Decreased mobility, Decreased strength, Increased edema  Visit Diagnosis: Acute pain of left  knee  Stiffness of left knee, not elsewhere classified  Difficulty in walking, not elsewhere classified  Muscle weakness (generalized)     Problem List Patient Active Problem List   Diagnosis Date Noted  . Osteopenia 11/10/2017  . OSA on CPAP 01/12/2016  . Insomnia with sleep apnea 01/12/2016  . Essential hypertension 11/30/2015  . Hyperlipidemia 11/30/2015  Shawnie Nicole C. Davonne Jarnigan PT, DPT 06/22/19 4:33 PM   [facility] [address] [place], [place], [address] Phone: [phone]   Fax:  [phone]  Name: Brittney Williamson MRN: [id number] Date of Birth: [birth year]

## 2019-06-24 ENCOUNTER — Other Ambulatory Visit: Payer: Self-pay

## 2019-06-24 ENCOUNTER — Ambulatory Visit: Payer: Medicare Other | Admitting: Physical Therapy

## 2019-06-24 ENCOUNTER — Encounter: Payer: Self-pay | Admitting: Physical Therapy

## 2019-06-24 DIAGNOSIS — M25662 Stiffness of left knee, not elsewhere classified: Secondary | ICD-10-CM

## 2019-06-24 DIAGNOSIS — R262 Difficulty in walking, not elsewhere classified: Secondary | ICD-10-CM

## 2019-06-24 DIAGNOSIS — M25562 Pain in left knee: Secondary | ICD-10-CM | POA: Diagnosis not present

## 2019-06-24 DIAGNOSIS — M6281 Muscle weakness (generalized): Secondary | ICD-10-CM

## 2019-06-24 NOTE — Therapy (Signed)
[place], [place], [address] Phone: [phone]   Fax:  [phone]  Physical Therapy Treatment  Patient Details  Name: SALIMA GEBHARD MRN: [id number] Date of Birth: 03/22/1951 Referring Provider (PT): Vickey Huger, MD   Encounter Date: 06/24/2019  PT End of Session - 06/24/19 1622    Visit Number  5    Number of Visits  13    Date for PT Re-Evaluation  07/17/19    Authorization Type  MCR    PT Start Time  1544    PT Stop Time  1637    PT Time Calculation (min)  53 min    Activity Tolerance  Patient tolerated treatment well    Behavior During Therapy  [facility] for tasks assessed/performed       Past Medical History:  Diagnosis Date  . Mitral valve prolapse 1993  . OSA on CPAP     Past Surgical History:  Procedure Laterality Date  . KNEE ARTHROSCOPY WITH MEDIAL MENISECTOMY Left 05/27/2019  . WRIST SURGERY Right 2017    There were no vitals filed for this visit.  Subjective Assessment - 06/24/19 1549    Subjective  Both knees are very swollen today.    Patient Stated Goals  back to exercise. garden, eat well    Currently in Pain?  Yes    Pain Score  5     Pain Location  Knee    Pain Orientation  Left    Pain Descriptors / Indicators  Aching                       [facility] Adult PT Treatment/Exercise - 06/24/19 0001      Knee/Hip Exercises: Aerobic   Stationary Bike  6 min L1      Modalities   Modalities  Vasopneumatic      Vasopneumatic   Number Minutes Vasopneumatic   15 minutes   not billed- not in diagnoses   Vasopnuematic Location   Knee   Left   Vasopneumatic Pressure  Low    Vasopneumatic Temperature   coldest      Manual Therapy   Manual therapy comments  edema management    Soft tissue mobilization  Lt quads                  PT Long Term Goals - 06/18/19 1646      PT LONG TERM GOAL #1   Title  Pt will be independent in techniques to avoid  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    Time  6    Period  Weeks    Status  On-going      PT LONG TERM GOAL #2   Title  pt will demo proper squat and lift technique to grab objects from the floor    Baseline  Initial training with hip hinge and modified DL with 10 in platform    Time  6    Period  Weeks            Plan - 06/24/19 1622    Clinical Impression Statement  Edema noted in Lt knee managed today via maual edema mobilization and use of vaso machine. Advised her to use rolling pin at home to roll out quads since they were so tight today.    PT Treatment/Interventions  ADLs/Self Care Home Management;Cryotherapy;Electrical Stimulation;Gait training;Moist Heat;Stair training;Functional mobility training;Therapeutic activities;Therapeutic exercise;Balance training;Neuromuscular re-education;Manual techniques;Patient/family education;Passive range of motion;Dry needling;Taping  PT Next Visit Plan  still swollen? k-tape PRN cont to progress strength    PT Home Exercise Plan  LAQ, heel-toe gait, ice, NRPGGRLL    Consulted and Agree with Plan of Care  Patient       Patient will benefit from skilled therapeutic intervention in order to improve the following deficits and impairments:  Abnormal gait, Decreased range of motion, Difficulty walking, Decreased activity tolerance, Pain, Increased muscle spasms, Improper body mechanics, Impaired flexibility, Decreased mobility, Decreased strength, Increased edema  Visit Diagnosis: Acute pain of left knee  Stiffness of left knee, not elsewhere classified  Difficulty in walking, not elsewhere classified  Muscle weakness (generalized)     Problem List Patient Active Problem List   Diagnosis Date Noted  . Osteopenia 11/10/2017  . OSA on CPAP 01/12/2016  . Insomnia with sleep apnea 01/12/2016  . Essential hypertension 11/30/2015  . Hyperlipidemia 11/30/2015    Andris Brothers C. Dwight Adamczak PT, DPT 06/24/19 4:25 PM   [facility]  [facility] Outpatient Rehabilitation [facility] [address] [place], [place], [address] Phone: [phone]   Fax:  [phone]  Name: LAROYCE WUJCIK MRN: [id number] Date of Birth: April 06, 1951

## 2019-06-29 ENCOUNTER — Other Ambulatory Visit: Payer: Self-pay

## 2019-06-29 ENCOUNTER — Encounter: Payer: Self-pay | Admitting: Physical Therapy

## 2019-06-29 ENCOUNTER — Ambulatory Visit: Payer: Medicare Other | Admitting: Physical Therapy

## 2019-06-29 DIAGNOSIS — M25662 Stiffness of left knee, not elsewhere classified: Secondary | ICD-10-CM | POA: Diagnosis not present

## 2019-06-29 DIAGNOSIS — M6281 Muscle weakness (generalized): Secondary | ICD-10-CM

## 2019-06-29 DIAGNOSIS — M25562 Pain in left knee: Secondary | ICD-10-CM

## 2019-06-29 DIAGNOSIS — R262 Difficulty in walking, not elsewhere classified: Secondary | ICD-10-CM

## 2019-06-29 NOTE — Therapy (Signed)
[place], [place], [address] Phone: [phone]   Fax:  [phone]  Physical Therapy Treatment  Patient Details  Name: Brittney Williamson MRN: [id number] Date of Birth: 28-Mar-1951 Referring Provider (PT): Vickey Huger, MD   Encounter Date: 06/29/2019  PT End of Session - 06/29/19 1555    Visit Number  6    Number of Visits  13    Date for PT Re-Evaluation  07/17/19    Authorization Type  MCR    PT Start Time  1546    PT Stop Time  1628    PT Time Calculation (min)  42 min    Activity Tolerance  Patient tolerated treatment well    Behavior During Therapy  [facility] for tasks assessed/performed       Past Medical History:  Diagnosis Date  . Mitral valve prolapse 1993  . OSA on CPAP     Past Surgical History:  Procedure Laterality Date  . KNEE ARTHROSCOPY WITH MEDIAL MENISECTOMY Left 05/27/2019  . WRIST SURGERY Right 2017    There were no vitals filed for this visit.  Subjective Assessment - 06/29/19 1550    Subjective  I did not work yesterday becuase both knees were so swollen. Points to superior pole of Left patella when describing pain. I raked the yard on Saturday. I think I am doing too much.    Currently in Pain?  Yes    Pain Score  4     Pain Location  Knee    Pain Orientation  Left    Pain Descriptors / Indicators  Sharp;Stabbing    Aggravating Factors   stairs    Pain Relieving Factors  ice                       [facility] Adult PT Treatment/Exercise - 06/29/19 0001      Knee/Hip Exercises: Aerobic   Nustep  10 min L3 LE only      Manual Therapy   Soft tissue mobilization  Lt quads- rolled with bolster      Kinesiotix   Edema  Lt knee                  PT Long Term Goals - 06/18/19 1646      PT LONG TERM GOAL #1   Title  Pt will be independent in techniques to avoid 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    Time  6    Period  Weeks    Status  On-going       PT LONG TERM GOAL #2   Title  pt will demo proper squat and lift technique to grab objects from the floor    Baseline  Initial training with hip hinge and modified DL with 10 in platform    Time  6    Period  Weeks            Plan - 06/29/19 1808    Clinical Impression Statement  Significant tightness noted in Lt quads- was unable to tolerate IASTM or muscle roller but was able to tolerate use of bolster for rolling. Placed edema tape again today for swelling. Pt was able to ambulate without antalgic pattern on left side today but was antalgic on Rt. We discussed doing heavy activities such as yard work in short bursts and taking time to ice.    PT Treatment/Interventions  ADLs/Self Care Home Management;Cryotherapy;Electrical Stimulation;Gait training;Moist Heat;Stair training;Functional mobility  training;Therapeutic activities;Therapeutic exercise;Balance training;Neuromuscular re-education;Manual techniques;Patient/family education;Passive range of motion;Dry needling;Taping    PT Next Visit Plan  MD appointment go well?  Ktape PRN, hip abd strength    PT Home Exercise Plan  LAQ, heel-toe gait, ice, NRPGGRLL    Consulted and Agree with Plan of Care  Patient       Patient will benefit from skilled therapeutic intervention in order to improve the following deficits and impairments:  Abnormal gait, Decreased range of motion, Difficulty walking, Decreased activity tolerance, Pain, Increased muscle spasms, Improper body mechanics, Impaired flexibility, Decreased mobility, Decreased strength, Increased edema  Visit Diagnosis: Acute pain of left knee  Stiffness of left knee, not elsewhere classified  Difficulty in walking, not elsewhere classified  Muscle weakness (generalized)     Problem List Patient Active Problem List   Diagnosis Date Noted  . Osteopenia 11/10/2017  . OSA on CPAP 01/12/2016  . Insomnia with sleep apnea 01/12/2016  . Essential hypertension 11/30/2015  .  Hyperlipidemia 11/30/2015    Omari Mcmanaway C. Kendrew Paci PT, DPT 06/29/19 6:11 PM   [facility] [address] [place], [place], [address] Phone: [phone]   Fax:  [phone]  Name: Brittney Williamson MRN: [id number] Date of Birth: 11-27-1950

## 2019-07-01 ENCOUNTER — Encounter: Payer: Self-pay | Admitting: Physical Therapy

## 2019-07-01 ENCOUNTER — Other Ambulatory Visit: Payer: Self-pay

## 2019-07-01 ENCOUNTER — Ambulatory Visit: Payer: Medicare Other | Admitting: Physical Therapy

## 2019-07-01 DIAGNOSIS — M25662 Stiffness of left knee, not elsewhere classified: Secondary | ICD-10-CM

## 2019-07-01 DIAGNOSIS — M25562 Pain in left knee: Secondary | ICD-10-CM | POA: Diagnosis not present

## 2019-07-01 DIAGNOSIS — M6281 Muscle weakness (generalized): Secondary | ICD-10-CM | POA: Diagnosis not present

## 2019-07-01 DIAGNOSIS — R262 Difficulty in walking, not elsewhere classified: Secondary | ICD-10-CM | POA: Diagnosis not present

## 2019-07-01 NOTE — Therapy (Signed)
[place], [place], [address] Phone: [phone]   Fax:  [phone]  Physical Therapy Treatment  Patient Details  Name: Brittney Williamson MRN: [id number] Date of Birth: 11/28/1950 Referring Provider (PT): Vickey Huger, MD   Encounter Date: 07/01/2019  PT End of Session - 07/01/19 1606    Visit Number  7    Number of Visits  13    Date for PT Re-Evaluation  07/17/19    Authorization Type  MCR    PT Start Time  1545    PT Stop Time  1636    PT Time Calculation (min)  51 min    Activity Tolerance  Patient tolerated treatment well    Behavior During Therapy  [facility] for tasks assessed/performed       Past Medical History:  Diagnosis Date  . Mitral valve prolapse 1993  . OSA on CPAP     Past Surgical History:  Procedure Laterality Date  . KNEE ARTHROSCOPY WITH MEDIAL MENISECTOMY Left 05/27/2019  . WRIST SURGERY Right 2017    There were no vitals filed for this visit.  Subjective Assessment - 07/01/19 1605    Subjective  Sore- drove to [facility] and back today and will have to tomorrow. tried rolling at home.         [facility] PT Assessment - 07/01/19 0001      AROM   Left Knee Extension  0      Strength   Left Knee Flexion  5/5    Left Knee Extension  5/5                   [facility] Adult PT Treatment/Exercise - 07/01/19 0001      Therapeutic Activites    Therapeutic Activities  Other Therapeutic Activities    Other Therapeutic Activities  stairs      Knee/Hip Exercises: Aerobic   Nustep  7 min LE only L4      Knee/Hip Exercises: Seated   Long Arc Quad  20 reps;Both    Long Arc Quad Limitations  ball bw knees      Knee/Hip Exercises: Sidelying   Hip ABduction Limitations  knee bent-holding ball      Knee/Hip Exercises: Prone   Hip Extension  Both;20 reps    Hip Extension Limitations  ball behind knee      Modalities   Modalities  Cryotherapy      Cryotherapy   Number  Minutes Cryotherapy  10 Minutes    Cryotherapy Location  Knee    Type of Cryotherapy  Ice pack      Kinesiotix   Edema  Lt knee                  PT Long Term Goals - 06/18/19 1646      PT LONG TERM GOAL #1   Title  Pt will be independent in techniques to avoid 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    Time  6    Period  Weeks    Status  On-going      PT LONG TERM GOAL #2   Title  pt will demo proper squat and lift technique to grab objects from the floor    Baseline  Initial training with hip hinge and modified DL with 10 in platform    Time  6    Period  Weeks            Plan - 07/01/19 1622  Clinical Impression Statement  Pt has large steps to enter home so we worked on posture and pattern for climbing stairs. Some discomfort as we discussed should be expected with stairs. Able to use gluts and avoid hesitating to bend knee to remove some pressure.    PT Treatment/Interventions  ADLs/Self Care Home Management;Cryotherapy;Electrical Stimulation;Gait training;Moist Heat;Stair training;Functional mobility training;Therapeutic activities;Therapeutic exercise;Balance training;Neuromuscular re-education;Manual techniques;Patient/family education;Passive range of motion;Dry needling;Taping    PT Next Visit Plan  review stairs, continue gross strength    PT Home Exercise Plan  LAQ, heel-toe gait, ice, NRPGGRLL    Consulted and Agree with Plan of Care  Patient       Patient will benefit from skilled therapeutic intervention in order to improve the following deficits and impairments:  Abnormal gait, Decreased range of motion, Difficulty walking, Decreased activity tolerance, Pain, Increased muscle spasms, Improper body mechanics, Impaired flexibility, Decreased mobility, Decreased strength, Increased edema  Visit Diagnosis: Acute pain of left knee  Stiffness of left knee, not elsewhere classified  Difficulty in walking, not elsewhere classified  Muscle weakness  (generalized)     Problem List Patient Active Problem List   Diagnosis Date Noted  . Osteopenia 11/10/2017  . OSA on CPAP 01/12/2016  . Insomnia with sleep apnea 01/12/2016  . Essential hypertension 11/30/2015  . Hyperlipidemia 11/30/2015   [name] C. Rogerick Baldwin PT, DPT 07/01/19 5:21 PM   [facility] [address] [place], [place], [address] Phone: [phone]   Fax:  [phone]  Name: Brittney Williamson MRN: [id number] Date of Birth: [birth year]

## 2019-07-06 ENCOUNTER — Encounter: Payer: Self-pay | Admitting: Physical Therapy

## 2019-07-06 ENCOUNTER — Other Ambulatory Visit: Payer: Self-pay

## 2019-07-06 ENCOUNTER — Ambulatory Visit: Payer: Medicare Other | Admitting: Physical Therapy

## 2019-07-06 DIAGNOSIS — M25662 Stiffness of left knee, not elsewhere classified: Secondary | ICD-10-CM

## 2019-07-06 DIAGNOSIS — M25562 Pain in left knee: Secondary | ICD-10-CM

## 2019-07-06 DIAGNOSIS — R262 Difficulty in walking, not elsewhere classified: Secondary | ICD-10-CM | POA: Diagnosis not present

## 2019-07-06 DIAGNOSIS — M6281 Muscle weakness (generalized): Secondary | ICD-10-CM | POA: Diagnosis not present

## 2019-07-06 NOTE — Therapy (Signed)
[place], [place], [address] Phone: [phone]   Fax:  [phone]  Physical Therapy Treatment  Patient Details  Name: Brittney Williamson MRN: [id number] Date of Birth: Mar 10, 1951 Referring Provider (PT): Vickey Huger, MD   Encounter Date: 07/06/2019  PT End of Session - 07/06/19 1603    Visit Number  8    Number of Visits  13    Date for PT Re-Evaluation  07/17/19    Authorization Type  MCR    PT Start Time  1545    PT Stop Time  1637    PT Time Calculation (min)  52 min    Activity Tolerance  Patient tolerated treatment well;Patient limited by pain    Behavior During Therapy  [facility] for tasks assessed/performed       Past Medical History:  Diagnosis Date  . Mitral valve prolapse 1993  . OSA on CPAP     Past Surgical History:  Procedure Laterality Date  . KNEE ARTHROSCOPY WITH MEDIAL MENISECTOMY Left 05/27/2019  . WRIST SURGERY Right 2017    There were no vitals filed for this visit.  Subjective Assessment - 07/06/19 1602    Subjective  sore, just drove 4 hours. swollen on medial aspect of knee.    Currently in Pain?  Yes    Pain Score  2     Pain Location  Knee    Pain Orientation  Left    Pain Descriptors / Indicators  Sore    Aggravating Factors   CKC    Pain Relieving Factors  rest, ice                       [facility] Adult PT Treatment/Exercise - 07/06/19 0001      Knee/Hip Exercises: Stretches   Passive Hamstring Stretch Limitations  supine HS stretch with strap      Knee/Hip Exercises: Aerobic   Nustep  5 min LE only L3      Knee/Hip Exercises: Supine   Short Arc Quad Sets  Both;Strengthening    Bridges with Clamshell  Both;20 reps   blue tband on knees   Bridges with Clamshell  20 reps   single leg press out blue tband   Other Supine Knee/Hip Exercises  TT leg press out blue tband around knees      Manual Therapy   Soft tissue mobilization  IASTM lateral knee  incision site                  PT Long Term Goals - 06/18/19 1646      PT LONG TERM GOAL #1   Title  Pt will be independent in techniques to avoid 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    Time  6    Period  Weeks    Status  On-going      PT LONG TERM GOAL #2   Title  pt will demo proper squat and lift technique to grab objects from the floor    Baseline  Initial training with hip hinge and modified DL with 10 in platform    Time  6    Period  Weeks            Plan - 07/06/19 1627    Clinical Impression Statement  Scar tissue from lateral incision attaching to lateral patella reduced with IASTM. Wil progress to CKC as tolerated at next visit.    PT Treatment/Interventions  ADLs/Self Care  Home Management;Cryotherapy;Electrical Stimulation;Gait training;Moist Heat;Stair training;Functional mobility training;Therapeutic activities;Therapeutic exercise;Balance training;Neuromuscular re-education;Manual techniques;Patient/family education;Passive range of motion;Dry needling;Taping    PT Next Visit Plan  review stairs, continue gross strength    PT Home Exercise Plan  LAQ, heel-toe gait, ice, NRPGGRLL    Consulted and Agree with Plan of Care  Patient       Patient will benefit from skilled therapeutic intervention in order to improve the following deficits and impairments:  Abnormal gait, Decreased range of motion, Difficulty walking, Decreased activity tolerance, Pain, Increased muscle spasms, Improper body mechanics, Impaired flexibility, Decreased mobility, Decreased strength, Increased edema  Visit Diagnosis: Acute pain of left knee  Stiffness of left knee, not elsewhere classified  Difficulty in walking, not elsewhere classified  Muscle weakness (generalized)     Problem List Patient Active Problem List   Diagnosis Date Noted  . Osteopenia 11/10/2017  . OSA on CPAP 01/12/2016  . Insomnia with sleep apnea 01/12/2016  . Essential hypertension 11/30/2015   . Hyperlipidemia 11/30/2015   Joydan Gretzinger C. Onya Eutsler PT, DPT 07/06/19 4:30 PM   [facility] [address] [place], [place], [address] Phone: [phone]   Fax:  [phone]  Name: Brittney Williamson MRN: [id number] Date of Birth: [birth year]

## 2019-07-08 ENCOUNTER — Encounter: Payer: Self-pay | Admitting: Physical Therapy

## 2019-07-08 ENCOUNTER — Ambulatory Visit: Payer: Medicare Other | Admitting: Family Medicine

## 2019-07-08 ENCOUNTER — Other Ambulatory Visit: Payer: Self-pay

## 2019-07-08 ENCOUNTER — Ambulatory Visit: Payer: Medicare Other | Admitting: Physical Therapy

## 2019-07-08 DIAGNOSIS — Z1231 Encounter for screening mammogram for malignant neoplasm of breast: Secondary | ICD-10-CM | POA: Diagnosis not present

## 2019-07-08 DIAGNOSIS — M6281 Muscle weakness (generalized): Secondary | ICD-10-CM | POA: Diagnosis not present

## 2019-07-08 DIAGNOSIS — M25562 Pain in left knee: Secondary | ICD-10-CM | POA: Diagnosis not present

## 2019-07-08 DIAGNOSIS — R262 Difficulty in walking, not elsewhere classified: Secondary | ICD-10-CM | POA: Diagnosis not present

## 2019-07-08 DIAGNOSIS — M25662 Stiffness of left knee, not elsewhere classified: Secondary | ICD-10-CM | POA: Diagnosis not present

## 2019-07-08 NOTE — Therapy (Signed)
[place], [place], [address] Phone: [phone]   Fax:  [phone]  Physical Therapy Treatment  Patient Details  Name: Brittney Williamson MRN: [id number] Date of Birth: 19-Apr-1951 Referring Provider (PT): Vickey Huger, MD   Encounter Date: 07/08/2019  PT End of Session - 07/08/19 1553    Visit Number  9    Number of Visits  13    Date for PT Re-Evaluation  07/17/19    Authorization Type  MCR    PT Start Time  1545    PT Stop Time  1630    PT Time Calculation (min)  45 min    Activity Tolerance  Patient tolerated treatment well;Patient limited by pain    Behavior During Therapy  [facility] for tasks assessed/performed       Past Medical History:  Diagnosis Date  . Mitral valve prolapse 1993  . OSA on CPAP     Past Surgical History:  Procedure Laterality Date  . KNEE ARTHROSCOPY WITH MEDIAL MENISECTOMY Left 05/27/2019  . WRIST SURGERY Right 2017    There were no vitals filed for this visit.  Subjective Assessment - 07/08/19 1549    Subjective  I am hurting today. It was hurting early but then I drove 5 hours today. rubs area distal and medial to patella around the pes anserine.    Patient Stated Goals  back to exercise. garden, eat well    Currently in Pain?  Yes    Pain Score  4     Pain Location  Knee    Pain Orientation  Left;Medial;Lower    Pain Descriptors / Indicators  --   stiff   Aggravating Factors   sitting and driving long hours    Pain Relieving Factors  ice         [facility] PT Assessment - 07/08/19 0001      Palpation   Palpation comment  Lt MCL TTP, increased MCL & LCL laxity noted in Lt knee                   [facility] Adult PT Treatment/Exercise - 07/08/19 0001      Knee/Hip Exercises: Standing   Forward Step Up Limitations  fwd step up & down bil 6"    Wall Squat Limitations  mini wall squats      Knee/Hip Exercises: Supine   Short Arc Quad Sets   Both;Strengthening    Short Arc Quad Sets Limitations  leg over blue plyoball    Straight Leg Raise with External Rotation  Both    Straight Leg Raise with External Rotation Limitations  lower to hover over table      Knee/Hip Exercises: Sidelying   Clams  attempted side bridge but unable to tolerate on knee; performed in sidelying      Manual Therapy   Manual Therapy  Edema management    Edema Management  Soft tissue mobilzation and  effleurage to the knee and groin area              PT Education - 07/08/19 1605    Education Details  discussed MCL & ligamentous laxity around knee    Person(s) Educated  Patient    Methods  Explanation    Comprehension  Verbalized understanding;Need further instruction          PT Long Term Goals - 06/18/19 1646      PT LONG TERM GOAL #1   Title  Pt  will be independent in techniques to avoid 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    Time  6    Period  Weeks    Status  On-going      PT LONG TERM GOAL #2   Title  pt will demo proper squat and lift technique to grab objects from the floor    Baseline  Initial training with hip hinge and modified DL with 10 in platform    Time  [place] - 07/08/19 1637    Clinical Impression Statement  Patient presents to the clinic with Lt knee pain post knee surgery for Lt meniscus. She complains of medial stiffness and pain after driving 5 hours. She demonstrates improvement in reported pain ratings. Hip, knee, and ankle strength have improved. She is able to complete knee ROM and strengthening exercises, but complains of pain and unsteadiness. The patient still displays a lack of Lt terminal knee extension and has edema. She responded favorably to effleurage. She would benefit from skilled PT to further improve knee ROM and strength in order to perform ADLs and long driving trips for work.    Personal Factors and Comorbidities  Age;Profession    Examination-Activity  Limitations  Locomotion Level;Transfers;Bend;Sit;Carry;Sleep;Squat;Stairs;Stand    Examination-Participation Restrictions  Meal Prep;Driving;Other    PT Treatment/Interventions  ADLs/Self Care Home Management;Cryotherapy;Electrical Stimulation;Gait training;Moist Heat;Stair training;Functional mobility training;Therapeutic activities;Therapeutic exercise;Balance training;Neuromuscular re-education;Manual techniques;Patient/family education;Passive range of motion;Dry needling;Taping    PT Next Visit Plan  review stairs, continue gross strength (specifically hip abductors)       Patient will benefit from skilled therapeutic intervention in order to improve the following deficits and impairments:  Abnormal gait, Decreased range of motion, Difficulty walking, Decreased activity tolerance, Pain, Increased muscle spasms, Improper body mechanics, Impaired flexibility, Decreased mobility, Decreased strength, Increased edema, Hypermobility  Visit Diagnosis: Acute pain of left knee  Stiffness of left knee, not elsewhere classified  Difficulty in walking, not elsewhere classified  Muscle weakness (generalized)     Problem List Patient Active Problem List   Diagnosis Date Noted  . Osteopenia 11/10/2017  . OSA on CPAP 01/12/2016  . Insomnia with sleep apnea 01/12/2016  . Essential hypertension 11/30/2015  . Hyperlipidemia 11/30/2015    Dick Hark C. Jeris Roser PT, DPT 07/08/19 5:47 PM   [place] [facility] [address] [place], [place], [address] Phone: [phone]   Fax:  [phone]  Name: Brittney Williamson MRN: [id number] Date of Birth: 30-Sep-1950

## 2019-07-13 ENCOUNTER — Ambulatory Visit: Payer: Medicare Other | Admitting: Physical Therapy

## 2019-07-13 ENCOUNTER — Encounter: Payer: Self-pay | Admitting: Physical Therapy

## 2019-07-13 ENCOUNTER — Other Ambulatory Visit: Payer: Self-pay

## 2019-07-13 DIAGNOSIS — M6281 Muscle weakness (generalized): Secondary | ICD-10-CM | POA: Diagnosis not present

## 2019-07-13 DIAGNOSIS — M25662 Stiffness of left knee, not elsewhere classified: Secondary | ICD-10-CM | POA: Diagnosis not present

## 2019-07-13 DIAGNOSIS — R262 Difficulty in walking, not elsewhere classified: Secondary | ICD-10-CM

## 2019-07-13 DIAGNOSIS — M25562 Pain in left knee: Secondary | ICD-10-CM

## 2019-07-13 NOTE — Therapy (Addendum)
[place], [place], [address] Phone: [phone]   Fax:  [phone]  Physical Therapy Treatment Progress Note Reporting Period 06/02/2019 to 07/13/2019  See note below for Objective Data and Assessment of Progress/Goals.       Patient Details  Name: Brittney Williamson MRN: [id number] Date of Birth: [birth year] Referring Provider (PT): Vickey Huger, MD   Encounter Date: 07/13/2019  PT End of Session - 07/13/19 1549    Visit Number  10    Number of Visits  13    Date for PT Re-Evaluation  07/17/19    Authorization Type  MCR    PT Start Time  [id number]    PT Stop Time  1632    PT Time Calculation (min)  47 min    Activity Tolerance  Patient tolerated treatment well    Behavior During Therapy  [facility] for tasks assessed/performed       Past Medical History:  Diagnosis Date  . Mitral valve prolapse 1993  . OSA on CPAP     Past Surgical History:  Procedure Laterality Date  . KNEE ARTHROSCOPY WITH MEDIAL MENISECTOMY Left 05/27/2019  . WRIST SURGERY Right 2017    There were no vitals filed for this visit.  Subjective Assessment - 07/13/19 1547    Subjective  I am hurting today. I drove this morning  but I was hurting before that because I was gardening this weekend. I am wearing a brace on my left knee that I used to wear on my Right knee. knee felt way better after edema mobilization at last visit.    Patient Stated Goals  back to exercise. garden, eat well    Currently in Pain?  Yes    Pain Score  7     Pain Location  Knee    Pain Orientation  Left    Pain Descriptors / Indicators  Sharp   when I step   Aggravating Factors   first thing in the morning, stepping    Pain Relieving Factors  nustep, movement         [facility] PT Assessment - 07/13/19 0001      Assessment   Medical Diagnosis  s/p Lt knee menisectomy    Referring Provider (PT)  Vickey Huger, MD    Onset Date/Surgical Date  05/27/19    Hand  Dominance  Right      Precautions   Precautions  None      Restrictions   Weight Bearing Restrictions  No      Balance Screen   Has the patient fallen in the past 6 months  No      [place] residence    Living Arrangements  Alone    Additional Comments  3 steps to enter with top one being tall stoop      Prior Function   Level of Independence  Independent    Vocation  Full time employment    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 [employer]   Overall Cognitive Status  Within Functional Limits for tasks assessed      Observation/Other Assessments   Focus on Therapeutic Outcomes (FOTO)   33% limited      Strength   Left Hip Flexion  4+/5    Left Hip Extension  4-/5    Left Hip ABduction  4/5  Flexibility   Soft Tissue Assessment /Muscle Length  yes    Quadriceps  limited on Lt      Palpation   Palpation comment  Lt MCL & LCL laxity                   [facility] Adult PT Treatment/Exercise - 07/13/19 0001      Knee/Hip Exercises: Standing   Forward Step Up  Both;Hand Hold: 1;Step Height: 6"    Wall Squat  3 sets;10 reps      Knee/Hip Exercises: Supine   Short Arc Quad Sets  Both;3 sets;10 reps    Short Arc Quad Sets Limitations  leg over yellow plyoball    Bridges with Clamshell  Both;3 sets;10 reps    Straight Leg Raises  Both;3 sets;10 reps             PT Education - 07/13/19 1619    Education Details  FOTO, progression rates of function and pain levels, exercise form/rationale    Person(s) Educated  Patient    Methods  Explanation;Demonstration;Tactile cues;Verbal cues    Comprehension  Verbalized understanding;Returned demonstration;Verbal cues required;Tactile cues required;Need further instruction          PT Long Term Goals - 07/13/19 1550      PT LONG TERM GOAL #1   Title  Pt will be independent in techniques to avoid stiffness/pain when  driving for work    Status  Achieved      PT LONG TERM GOAL #2   Title  pt will demo proper squat and lift technique to grab objects from the floor    Baseline  avoids squatting  and bends over to reach floor    Status  On-going      PT LONG TERM GOAL #3   Title  pt will be able to garden as previously able    Baseline  did gardening this weekend but now has 7/10 pain; was not making any modifications      PT LONG TERM GOAL #4   Title  up/down from floor indepedently    Baseline  able with difficulty, has to use things to pull on    Status  On-going      PT LONG TERM GOAL #5   Title  gross LE strength 5/5    Baseline  see flowsheet    Status  On-going            Plan - 07/13/19 1715    Clinical Impression Statement  Today the patient is able to complete ROM and strengthening activities with less difficulties. She was able to increase her number of sets for quad strengthening activities and was able endure weight bearing exercises for a longer duration. Patient has surpassed her FOTO goal, but still reports pain in the left knee and seeks further improvements in LE strengthening. Patient would further benefit from skilled PT in order to improve functional activities and to decrease pain.    PT Treatment/Interventions  ADLs/Self Care Home Management;Cryotherapy;Electrical Stimulation;Gait training;Moist Heat;Stair training;Functional mobility training;Therapeutic activities;Therapeutic exercise;Balance training;Neuromuscular re-education;Manual techniques;Patient/family education;Passive range of motion;Dry needling;Taping    PT Next Visit Plan  review stairs, continue gross strength (specifically hip abductors and quads), Introduce patient to kneeling activities    PT Home Exercise Plan  LAQ, heel-toe gait, ice, NRPGGRLL, Clamshells, Mini Wall Squats       Patient will benefit from skilled therapeutic intervention in order to improve the following deficits and impairments:   Abnormal gait,  Decreased range of motion, Difficulty walking, Decreased activity tolerance, Pain, Increased muscle spasms, Improper body mechanics, Impaired flexibility, Decreased mobility, Decreased strength, Increased edema, Hypermobility  Visit Diagnosis: Acute pain of left knee  Stiffness of left knee, not elsewhere classified  Difficulty in walking, not elsewhere classified  Muscle weakness (generalized)     Problem List Patient Active Problem List   Diagnosis Date Noted  . Osteopenia 11/10/2017  . OSA on CPAP 01/12/2016  . Insomnia with sleep apnea 01/12/2016  . Essential hypertension 11/30/2015  . Hyperlipidemia 11/30/2015    Laveda Norman, SPT Wannetta Langland C. Jonhatan Hearty PT, DPT 07/13/19 5:35 PM  During this treatment session, the therapist was present, participating in and directing the treatment. [place], [place], [address] Phone: [phone]   Fax:  [phone]  Name: EULINE MCCORY MRN: [id number] Date of Birth: 1950-08-07

## 2019-07-15 ENCOUNTER — Other Ambulatory Visit: Payer: Self-pay

## 2019-07-15 ENCOUNTER — Encounter: Payer: Self-pay | Admitting: Physical Therapy

## 2019-07-15 ENCOUNTER — Ambulatory Visit: Payer: Medicare Other | Admitting: Physical Therapy

## 2019-07-15 DIAGNOSIS — R262 Difficulty in walking, not elsewhere classified: Secondary | ICD-10-CM

## 2019-07-15 DIAGNOSIS — M6281 Muscle weakness (generalized): Secondary | ICD-10-CM

## 2019-07-15 DIAGNOSIS — M25662 Stiffness of left knee, not elsewhere classified: Secondary | ICD-10-CM

## 2019-07-15 DIAGNOSIS — M25562 Pain in left knee: Secondary | ICD-10-CM | POA: Diagnosis not present

## 2019-07-16 NOTE — Therapy (Signed)
[place], [place], [address] Phone: [phone]   Fax:  [phone]  Physical Therapy Treatment  Patient Details  Name: Brittney Williamson MRN: [id number] Date of Birth: 04/09/1951 Referring Provider (PT): Vickey Huger, MD   Encounter Date: 07/15/2019  PT End of Session - 07/15/19 1549    Visit Number  11    Number of Visits  13    Date for PT Re-Evaluation  08/21/19    Authorization Type  MCR    Progress Note Due on Visit  20    PT Start Time  [id number]    PT Stop Time  1630    PT Time Calculation (min)  45 min    Activity Tolerance  Patient tolerated treatment well    Behavior During Therapy  [facility] for tasks assessed/performed       Past Medical History:  Diagnosis Date  . Mitral valve prolapse 1993  . OSA on CPAP     Past Surgical History:  Procedure Laterality Date  . KNEE ARTHROSCOPY WITH MEDIAL MENISECTOMY Left 05/27/2019  . WRIST SURGERY Right 2017    There were no vitals filed for this visit.  Subjective Assessment - 07/15/19 1546    Subjective  I didn't work today so I was at home and rested it. I have my knee brace on. Pt skipped into the clinic today.  They want me to work 7 hrs saturday and sunday.    Patient Stated Goals  back to exercise. garden, eat well    Currently in Pain?  No/denies                                    PT Long Term Goals - 07/13/19 1550      PT LONG TERM GOAL #1   Title  Pt will be independent in techniques to avoid 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    Status  Achieved      PT LONG TERM GOAL #2   Title  pt will demo proper squat and lift technique to grab objects from the floor    Baseline  avoids squatting  and bends over to reach floor    Status  On-going      PT LONG TERM GOAL #3   Title  pt will be able to garden as previously able    Baseline  did gardening this weekend but now has 7/10 pain; was not making any  modifications      PT LONG TERM GOAL #4   Title  up/down from floor indepedently    Baseline  able with difficulty, has to use things to pull on    Status  On-going      PT LONG TERM GOAL #5   Title  gross LE strength 5/5    Baseline  see flowsheet    Status  On-going            Plan - 07/15/19 1634    Clinical Impression Statement  Patient came into therapy skipping and is using a new knee brace. She reports no pain and indicated. She was able to perform all strengthening activities and showed a great amount of improvement with the wall squats. Lunges were introduced; she is able to complete her sets and reps but still has difficulty with balance and knee ROM. Patient is doing a better job with controlled step ups and downs.  She would benefit from PT in order to further address ROM and Strength deficits in the hip and knee.    PT Treatment/Interventions  ADLs/Self Care Home Management;Cryotherapy;Electrical Stimulation;Gait training;Moist Heat;Stair training;Functional mobility training;Therapeutic activities;Therapeutic exercise;Balance training;Neuromuscular re-education;Manual techniques;Patient/family education;Passive range of motion;Dry needling;Taping    PT Next Visit Plan  review stairs, continue gross strength (specifically hip abductors and quads), Introduce patient to kneeling activities, revisit lunges    PT Home Exercise Plan  LAQ, heel-toe gait, ice, NRPGGRLL, Clamshells, Mini Wall Squats    Consulted and Agree with Plan of Care  Patient       Patient will benefit from skilled therapeutic intervention in order to improve the following deficits and impairments:  Abnormal gait, Decreased range of motion, Difficulty walking, Decreased activity tolerance, Pain, Increased muscle spasms, Improper body mechanics, Impaired flexibility, Decreased mobility, Decreased strength, Increased edema, Hypermobility  Visit Diagnosis: Acute pain of left knee  Stiffness of left knee, not  elsewhere classified  Difficulty in walking, not elsewhere classified  Muscle weakness (generalized)     Problem List Patient Active Problem List   Diagnosis Date Noted  . Osteopenia 11/10/2017  . OSA on CPAP 01/12/2016  . Insomnia with sleep apnea 01/12/2016  . Essential hypertension 11/30/2015  . Hyperlipidemia 11/30/2015    Laveda Norman, SPT Westyn Driggers C. See Beharry PT, DPT 07/16/19 12:53 PM  During this treatment session, the therapist was present, participating in and directing the treatment.    [place], [place], [address] Phone: [phone]   Fax:  [phone]  Name: Brittney Williamson MRN: [id number] Date of Birth: 17-Mar-1951

## 2019-07-21 ENCOUNTER — Ambulatory Visit: Payer: Medicare Other | Admitting: Physical Therapy

## 2019-07-21 ENCOUNTER — Encounter: Payer: Self-pay | Admitting: Physical Therapy

## 2019-07-21 ENCOUNTER — Other Ambulatory Visit: Payer: Self-pay

## 2019-07-21 DIAGNOSIS — M6281 Muscle weakness (generalized): Secondary | ICD-10-CM | POA: Diagnosis not present

## 2019-07-21 DIAGNOSIS — M25662 Stiffness of left knee, not elsewhere classified: Secondary | ICD-10-CM | POA: Diagnosis not present

## 2019-07-21 DIAGNOSIS — M25562 Pain in left knee: Secondary | ICD-10-CM

## 2019-07-21 DIAGNOSIS — R262 Difficulty in walking, not elsewhere classified: Secondary | ICD-10-CM

## 2019-07-21 NOTE — Therapy (Signed)
[place], [place], [address] Phone: [phone]   Fax:  [phone]  Physical Therapy Treatment  Patient Details  Name: Brittney Williamson MRN: [id number] Date of Birth: 11-28-1950 Referring Provider (PT): Vickey Huger, MD   Encounter Date: 07/21/2019  PT End of Session - 07/21/19 1336    Visit Number  12    Number of Visits  13    Date for PT Re-Evaluation  08/21/19    Authorization Type  MCR    PT Start Time  1330    PT Stop Time  1415    PT Time Calculation (min)  45 min    Activity Tolerance  Patient tolerated treatment well    Behavior During Therapy  [facility] for tasks assessed/performed       Past Medical History:  Diagnosis Date  . Mitral valve prolapse 1993  . OSA on CPAP     Past Surgical History:  Procedure Laterality Date  . KNEE ARTHROSCOPY WITH MEDIAL MENISECTOMY Left 05/27/2019  . WRIST SURGERY Right 2017    There were no vitals filed for this visit.  Subjective Assessment - 07/21/19 1335    Subjective  I was able to mow my lawn and then drove 3 trips for work. I feel stronger and am able to do steps.    Patient Stated Goals  back to exercise. garden, eat well    Currently in Pain?  Yes    Pain Score  3     Pain Location  Knee    Pain Orientation  Left    Pain Descriptors / Indicators  --   swollen   Aggravating Factors   increasing activity                       [facility] Adult PT Treatment/Exercise - 07/21/19 0001      Knee/Hip Exercises: Stretches   Other Knee/Hip Stretches  Knee flexion in Quadraped      Knee/Hip Exercises: Aerobic   Nustep  5 min Le only L6      Knee/Hip Exercises: Standing   Forward Lunges  Both;2 sets;5 sets    Functional Squat  10 reps;2 sets      Manual Therapy   Edema Management  supine edema mobilization      [employer]   Edema  left knee                  PT Long Term Goals - 07/13/19 1550      PT LONG TERM GOAL  #1   Title  Pt will be independent in techniques to avoid 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    Status  Achieved      PT LONG TERM GOAL #2   Title  pt will demo proper squat and lift technique to grab objects from the floor    Baseline  avoids squatting  and bends over to reach floor    Status  On-going      PT LONG TERM GOAL #3   Title  pt will be able to garden as previously able    Baseline  did gardening this weekend but now has 7/10 pain; was not making any modifications      PT LONG TERM GOAL #4   Title  up/down from floor indepedently    Baseline  able with difficulty, has to use things to pull on    Status  On-going  PT LONG TERM GOAL #5   Title  gross LE strength 5/5    Baseline  see flowsheet    Status  On-going            Plan - 07/21/19 1513    Clinical Impression Statement  The patient presents to the clinic with swelling in her Lt. knee after mowing her lawn. She has some pain over the incision site, but reports an increase in confidence and strength. She was able to tolerate CKC exercises well, but is still hesitant to put direct pressure on her knees. She still has difficulty with balance and Lt. Knee ROM.    Personal Factors and Comorbidities  Age;Profession    Examination-Activity Limitations  Locomotion Level;Transfers;Bend;Sit;Carry;Sleep;Squat;Stairs;Stand    Examination-Participation Restrictions  Meal Prep;Driving;Other    Stability/Clinical Decision Making  Stable/Uncomplicated    Clinical Decision Making  Low    Rehab Potential  Good    PT Frequency  2x / week    PT Duration  6 weeks    PT Treatment/Interventions  ADLs/Self Care Home Management;Cryotherapy;Electrical Stimulation;Gait training;Moist Heat;Stair training;Functional mobility training;Therapeutic activities;Therapeutic exercise;Balance training;Neuromuscular re-education;Manual techniques;Patient/family education;Passive range of motion;Dry needling;Taping    PT Next Visit Plan   review stairs, continue gross strength (specifically hip abductors and quads), Introduce patient to kneeling activities, revisit lunges, address balance    PT Home Exercise Plan  LAQ, heel-toe gait, ice, NRPGGRLL, Clamshells, Mini Wall Squats    Consulted and Agree with Plan of Care  Patient       Patient will benefit from skilled therapeutic intervention in order to improve the following deficits and impairments:  Abnormal gait, Decreased range of motion, Difficulty walking, Decreased activity tolerance, Pain, Increased muscle spasms, Improper body mechanics, Impaired flexibility, Decreased mobility, Decreased strength, Increased edema, Hypermobility  Visit Diagnosis: Acute pain of left knee  Stiffness of left knee, not elsewhere classified  Difficulty in walking, not elsewhere classified  Muscle weakness (generalized)     Problem List Patient Active Problem List   Diagnosis Date Noted  . Osteopenia 11/10/2017  . OSA on CPAP 01/12/2016  . Insomnia with sleep apnea 01/12/2016  . Essential hypertension 11/30/2015  . Hyperlipidemia 11/30/2015    Laveda Norman, SPT 07/21/2019, 3:29 PM  [facility] [address] [place], [place], [address] Phone: [phone]   Fax:  [phone]  Name: Brittney Williamson MRN: [id number] Date of Birth: [birth year]

## 2019-08-03 ENCOUNTER — Encounter: Payer: Self-pay | Admitting: Physical Therapy

## 2019-08-03 ENCOUNTER — Ambulatory Visit: Payer: Medicare Other | Attending: Orthopedic Surgery | Admitting: Physical Therapy

## 2019-08-03 ENCOUNTER — Other Ambulatory Visit: Payer: Self-pay

## 2019-08-03 DIAGNOSIS — M25662 Stiffness of left knee, not elsewhere classified: Secondary | ICD-10-CM | POA: Insufficient documentation

## 2019-08-03 DIAGNOSIS — R262 Difficulty in walking, not elsewhere classified: Secondary | ICD-10-CM | POA: Diagnosis not present

## 2019-08-03 DIAGNOSIS — M25562 Pain in left knee: Secondary | ICD-10-CM | POA: Insufficient documentation

## 2019-08-03 DIAGNOSIS — M6281 Muscle weakness (generalized): Secondary | ICD-10-CM

## 2019-08-03 NOTE — Therapy (Signed)
[place], [place], [address] Phone: [phone]   Fax:  [phone]  Physical Therapy Treatment  Patient Details  Name: Brittney Williamson MRN: [id number] Date of Birth: 1950-09-03 Referring Provider (PT): Vickey Huger, MD   Encounter Date: 08/03/2019  PT End of Session - 08/03/19 1547    Visit Number  13    Number of Visits  18    Date for PT Re-Evaluation  08/21/19    Authorization Type  MCR    Progress Note Due on Visit  20    PT Start Time  [id number]    PT Stop Time  1628    PT Time Calculation (min)  41 min    Activity Tolerance  Patient tolerated treatment well    Behavior During Therapy  [facility] for tasks assessed/performed       Past Medical History:  Diagnosis Date  . Mitral valve prolapse 1993  . OSA on CPAP     Past Surgical History:  Procedure Laterality Date  . KNEE ARTHROSCOPY WITH MEDIAL MENISECTOMY Left 05/27/2019  . WRIST SURGERY Right 2017    There were no vitals filed for this visit.  Subjective Assessment - 08/03/19 1549    Subjective  Patient reports that her knee is swollen after driving 16 hours last week. Her knee is in a lot of pain, and reports that she drove 3 hours today. She went to her surgeon and they told her that the swelling is normal and that she is progressing well.    Limitations  Standing;Walking    How long can you stand comfortably?  10-15 mins    How long can you walk comfortably?  longer than 15, less than 30    Patient Stated Goals  back to exercise. garden, eat well    Currently in Pain?  Yes    Pain Score  7     Pain Location  Knee    Pain Orientation  Left    Pain Descriptors / Indicators  Aching    Pain Type  Acute pain;Surgical pain    Pain Onset  More than a month ago    Pain Frequency  Constant    Aggravating Factors   increasing activity, sitting for long time    Pain Relieving Factors  movement, nustep         [facility] PT Assessment - 08/03/19  0001      Assessment   Medical Diagnosis  s/p Lt knee menisectomy    Referring Provider (PT)  Vickey Huger, MD    Onset Date/Surgical Date  05/27/19    Hand Dominance  Right      Palpation   Palpation comment  Lt MCL & LCL laxity                   [facility] Adult PT Treatment/Exercise - 08/03/19 0001      Knee/Hip Exercises: Machines for Strengthening   Other Machine  Bike L1 x 5 mins      Knee/Hip Exercises: Standing   Forward Step Up  Both;2 sets;10 reps    Step Down  Both;1 set;10 reps    Wall Squat  10 reps;4 sets    Other Standing Knee Exercises  Standing Quad Sets 3 sets of 10      Knee/Hip Exercises: Seated   Heel Slides  AAROM;2 sets;10 reps      Manual Therapy   Edema Management  supine edema mobilization (effleurage)  Kinesiotix   Edema  left knee                  PT Long Term Goals - 07/13/19 1550      PT LONG TERM GOAL #1   Title  Pt will be independent in techniques to avoid 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    Status  Achieved      PT LONG TERM GOAL #2   Title  pt will demo proper squat and lift technique to grab objects from the floor    Baseline  avoids squatting  and bends over to reach floor    Status  On-going      PT LONG TERM GOAL #3   Title  pt will be able to garden as previously able    Baseline  did gardening this weekend but now has 7/10 pain; was not making any modifications      PT LONG TERM GOAL #4   Title  up/down from floor indepedently    Baseline  able with difficulty, has to use things to pull on    Status  On-going      PT LONG TERM GOAL #5   Title  gross LE strength 5/5    Baseline  see flowsheet    Status  On-going            Plan - 08/03/19 1622    Clinical Impression Statement  Patient presents to the clinic with increased inflammation in her left knee after driving for 16 hours. She responded well to effleurage and light exercise. She reported that she felt better and less stiff after  the session. We discussed revisiting kneeling activities for her next PT session.    Personal Factors and Comorbidities  Age;Profession    Examination-Activity Limitations  Locomotion Level;Transfers;Bend;Sit;Carry;Sleep;Squat;Stairs;Stand    Examination-Participation Restrictions  Meal Prep;Driving;Other    Stability/Clinical Decision Making  Stable/Uncomplicated    Clinical Decision Making  Low    Rehab Potential  Good    PT Frequency  2x / week    PT Duration  6 weeks    PT Treatment/Interventions  ADLs/Self Care Home Management;Cryotherapy;Electrical Stimulation;Gait training;Moist Heat;Stair training;Functional mobility training;Therapeutic activities;Therapeutic exercise;Balance training;Neuromuscular re-education;Manual techniques;Patient/family education;Passive range of motion;Dry needling;Taping    PT Next Visit Plan  Measure ROM and strength, kneeling activities, FOTO    PT Home Exercise Plan  LAQ, heel-toe gait, ice, NRPGGRLL, Clamshells, Mini Wall Squats    Consulted and Agree with Plan of Care  Patient       Patient will benefit from skilled therapeutic intervention in order to improve the following deficits and impairments:  Abnormal gait, Decreased range of motion, Difficulty walking, Decreased activity tolerance, Pain, Increased muscle spasms, Improper body mechanics, Impaired flexibility, Decreased mobility, Decreased strength, Increased edema, Hypermobility  Visit Diagnosis: Acute pain of left knee - Plan: PT plan of care cert/re-cert  Stiffness of left knee, not elsewhere classified - Plan: PT plan of care cert/re-cert  Difficulty in walking, not elsewhere classified - Plan: PT plan of care cert/re-cert  Muscle weakness (generalized) - Plan: PT plan of care cert/re-cert     Problem List Patient Active Problem List   Diagnosis Date Noted  . Osteopenia 11/10/2017  . OSA on CPAP 01/12/2016  . Insomnia with sleep apnea 01/12/2016  . Essential hypertension  11/30/2015  . Hyperlipidemia 11/30/2015    Laveda Norman, SPT Justyne Roell C. China Deitrick PT, DPT 08/03/19 5:51 PM  During this treatment session, the therapist was present, participating  in and directing the treatment.  [place], [place], [address] Phone: [phone]   Fax:  [phone]  Name: Brittney Williamson MRN: [id number] Date of Birth: 1950-09-29

## 2019-08-05 ENCOUNTER — Other Ambulatory Visit: Payer: Self-pay

## 2019-08-05 ENCOUNTER — Encounter: Payer: Self-pay | Admitting: Physical Therapy

## 2019-08-05 ENCOUNTER — Ambulatory Visit: Payer: Medicare Other | Admitting: Physical Therapy

## 2019-08-05 DIAGNOSIS — M25662 Stiffness of left knee, not elsewhere classified: Secondary | ICD-10-CM | POA: Diagnosis not present

## 2019-08-05 DIAGNOSIS — M6281 Muscle weakness (generalized): Secondary | ICD-10-CM

## 2019-08-05 DIAGNOSIS — R262 Difficulty in walking, not elsewhere classified: Secondary | ICD-10-CM | POA: Diagnosis not present

## 2019-08-05 DIAGNOSIS — M25562 Pain in left knee: Secondary | ICD-10-CM | POA: Diagnosis not present

## 2019-08-05 NOTE — Therapy (Signed)
[place], [place], [address] Phone: [phone]   Fax:  [phone]  Physical Therapy Treatment  Patient Details  Name: Brittney Williamson MRN: [id number] Date of Birth: [birth year] Referring Provider (PT): Vickey Huger, MD   Encounter Date: 08/05/2019  PT End of Session - 08/05/19 1545    Visit Number  14    Number of Visits  18    Date for PT Re-Evaluation  08/21/19    Authorization Type  MCR    PT Start Time  1545    PT Stop Time  1634    PT Time Calculation (min)  49 min    Activity Tolerance  Patient tolerated treatment well    Behavior During Therapy  [facility] for tasks assessed/performed       Past Medical History:  Diagnosis Date  . Mitral valve prolapse 1993  . OSA on CPAP     Past Surgical History:  Procedure Laterality Date  . KNEE ARTHROSCOPY WITH MEDIAL MENISECTOMY Left 05/27/2019  . WRIST SURGERY Right 2017    There were no vitals filed for this visit.  Subjective Assessment - 08/05/19 1545    Subjective  Patient drove 4 hours today and her knee is hurting. She has been doing self massage and it has been helping. Patient is working on not pushing herself beyond her limits.    Limitations  Standing;Walking    Currently in Pain?  Yes    Pain Score  6     Pain Location  Knee    Pain Orientation  Left    Pain Descriptors / Indicators  Aching    Pain Type  Acute pain;Surgical pain    Pain Onset  More than a month ago    Pain Frequency  Constant    Aggravating Factors   Increasing activity, sitting for long time    Pain Relieving Factors  movement, nustep         [facility] PT Assessment - 08/05/19 0001      AROM   Left Knee Extension  0    Left Knee Flexion  120      Strength   Right Hip Flexion  4+/5    Right Hip Extension  4/5    Right Hip ABduction  4/5    Right Hip ADduction  4/5    Left Hip Flexion  4/5    Left Hip Extension  4+/5    Right Knee Flexion  4+/5    Right Knee  Extension  5/5    Left Knee Flexion  4+/5    Left Knee Extension  5/5                   [facility] Adult PT Treatment/Exercise - 08/05/19 0001      Therapeutic Activites    Therapeutic Activities  Other Therapeutic Activities    Other Therapeutic Activities  stairs      Knee/Hip Exercises: Aerobic   Nustep  5 min Le only L1      Knee/Hip Exercises: Standing   Functional Squat  3 sets;10 reps      Knee/Hip Exercises: Supine   Straight Leg Raises  AROM;Strengthening;Both;3 sets;10 reps      Knee/Hip Exercises: Sidelying   Hip ABduction  AROM;Strengthening;Both;3 sets;10 reps      Manual Therapy   Manual Therapy  Taping    Kinesiotex  Edema      Kinesiotix   Edema  left knee  PT Long Term Goals - 07/13/19 1550      PT LONG TERM GOAL #1   Title  Pt will be independent in techniques to avoid 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    Status  Achieved      PT LONG TERM GOAL #2   Title  pt will demo proper squat and lift technique to grab objects from the floor    Baseline  avoids squatting  and bends over to reach floor    Status  On-going      PT LONG TERM GOAL #3   Title  pt will be able to garden as previously able    Baseline  did gardening this weekend but now has 7/10 pain; was not making any modifications      PT LONG TERM GOAL #4   Title  up/down from floor indepedently    Baseline  able with difficulty, has to use things to pull on    Status  On-going      PT LONG TERM GOAL #5   Title  gross LE strength 5/5    Baseline  see flowsheet    Status  On-going            Plan - 08/05/19 1720    Clinical Impression Statement  Patient presents to the clinic with less inflammation in her Lt. knee. She reports that her long drive conrtibuted to her pain. She did well with stair training and was able to complete functional squats without the use of a wall. Patient had an increase in knee flexion (120) and displayed a great amount  of knee flexion/extension strength.    Personal Factors and Comorbidities  Age;Profession    Examination-Activity Limitations  Locomotion Level;Transfers;Bend;Sit;Carry;Sleep;Squat;Stairs;Stand    Examination-Participation Restrictions  Meal Prep;Driving;Other    Stability/Clinical Decision Making  Stable/Uncomplicated    Clinical Decision Making  Moderate    Rehab Potential  Good    PT Frequency  2x / week    PT Duration  6 weeks    PT Treatment/Interventions  ADLs/Self Care Home Management;Cryotherapy;Electrical Stimulation;Gait training;Moist Heat;Stair training;Functional mobility training;Therapeutic activities;Therapeutic exercise;Balance training;Neuromuscular re-education;Manual techniques;Patient/family education;Passive range of motion;Dry needling;Taping    PT Next Visit Plan  Measure ROM and strength, kneeling activities, FOTO    PT Home Exercise Plan  LAQ, heel-toe gait, ice, NRPGGRLL, Clamshells, Mini Wall Squats    Consulted and Agree with Plan of Care  Patient       Patient will benefit from skilled therapeutic intervention in order to improve the following deficits and impairments:  Abnormal gait, Decreased range of motion, Difficulty walking, Decreased activity tolerance, Pain, Increased muscle spasms, Improper body mechanics, Impaired flexibility, Decreased mobility, Decreased strength, Increased edema, Hypermobility  Visit Diagnosis: Acute pain of left knee  Stiffness of left knee, not elsewhere classified  Difficulty in walking, not elsewhere classified  Muscle weakness (generalized)     Problem List Patient Active Problem List   Diagnosis Date Noted  . Osteopenia 11/10/2017  . OSA on CPAP 01/12/2016  . Insomnia with sleep apnea 01/12/2016  . Essential hypertension 11/30/2015  . Hyperlipidemia 11/30/2015    Laveda Norman, SPT 08/05/2019, 5:28 PM  [facility] [address] [place], [place],  [address] Phone: [phone]   Fax:  [phone]  Name: Brittney Williamson MRN: [id number] Date of Birth: 01-11-1951

## 2019-08-10 ENCOUNTER — Ambulatory Visit: Payer: Medicare Other | Admitting: Physical Therapy

## 2019-08-10 ENCOUNTER — Other Ambulatory Visit: Payer: Self-pay

## 2019-08-10 ENCOUNTER — Encounter: Payer: Self-pay | Admitting: Physical Therapy

## 2019-08-10 VITALS — BP 148/97

## 2019-08-10 DIAGNOSIS — M25562 Pain in left knee: Secondary | ICD-10-CM | POA: Diagnosis not present

## 2019-08-10 DIAGNOSIS — M6281 Muscle weakness (generalized): Secondary | ICD-10-CM | POA: Diagnosis not present

## 2019-08-10 DIAGNOSIS — M25662 Stiffness of left knee, not elsewhere classified: Secondary | ICD-10-CM | POA: Diagnosis not present

## 2019-08-10 DIAGNOSIS — R262 Difficulty in walking, not elsewhere classified: Secondary | ICD-10-CM | POA: Diagnosis not present

## 2019-08-10 NOTE — Therapy (Signed)
[place], [place], [address] Phone: [phone]   Fax:  [phone]  Physical Therapy Treatment  Patient Details  Name: Brittney Williamson MRN: [id number] Date of Birth: 12/08/1950 Referring Provider (PT): Vickey Huger, MD   Encounter Date: 08/10/2019  PT End of Session - 08/10/19 1549    Visit Number  15    Number of Visits  18    Date for PT Re-Evaluation  08/21/19    Authorization Type  MCR    Progress Note Due on Visit  20    PT Start Time  [id number]    PT Stop Time  1643    PT Time Calculation (min)  53 min    Activity Tolerance  Patient tolerated treatment well    Behavior During Therapy  [facility] for tasks assessed/performed       Past Medical History:  Diagnosis Date  . Mitral valve prolapse 1993  . OSA on CPAP     Past Surgical History:  Procedure Laterality Date  . KNEE ARTHROSCOPY WITH MEDIAL MENISECTOMY Left 05/27/2019  . WRIST SURGERY Right 2017    Vitals:   08/10/19 1549  BP: (!) 148/97    Subjective Assessment - 08/10/19 1549    Subjective  "My knee is swollen, and I didn't do anything today. I drove for four hours last night. I have not had to take any meds today. My knees did good after the last PT session"    Limitations  Standing;Walking    Currently in Pain?  Yes    Pain Score  3     Pain Location  Knee    Pain Orientation  Left    Pain Descriptors / Indicators  Aching    Pain Type  Surgical pain    Pain Onset  More than a month ago    Pain Frequency  Constant    Aggravating Factors   Increasing activity, sitting for long time    Pain Relieving Factors  movement, NuStep                       [facility] Adult PT Treatment/Exercise - 08/10/19 0001      Exercises   Exercises  Knee/Hip      Knee/Hip Exercises: Machines for Strengthening   Other Machine  Bike L2 x 5 mins      Knee/Hip Exercises: Standing   Heel Raises  Both;3 sets;10 reps    Other Standing Knee  Exercises  Airex SLS and Double support balancing       Knee/Hip Exercises: Supine   Straight Leg Raises  AROM;Strengthening;Both;10 reps;2 sets      Knee/Hip Exercises: Sidelying   Hip ABduction  AROM;Strengthening;Both;10 reps;1 set      Manual Therapy   Manual Therapy  Edema management    Kinesiotex  Edema                  PT Long Term Goals - 07/13/19 1550      PT LONG TERM GOAL #1   Title  Pt will be independent in techniques to avoid 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    Status  Achieved      PT LONG TERM GOAL #2   Title  pt will demo proper squat and lift technique to grab objects from the floor    Baseline  avoids squatting  and bends over to reach floor    Status  On-going  PT LONG TERM GOAL #3   Title  pt will be able to garden as previously able    Baseline  did gardening this weekend but now has 7/10 pain; was not making any modifications      PT LONG TERM GOAL #4   Title  up/down from floor indepedently    Baseline  able with difficulty, has to use things to pull on    Status  On-going      PT LONG TERM GOAL #5   Title  gross LE strength 5/5    Baseline  see flowsheet    Status  On-going            Plan - 08/10/19 1642    Clinical Impression Statement  Today the patient has some swelling in her knees. She reports that she drove for four hours last night. She tolerated the session well, but still requires improvements with balance. Additionally, her quad muscles are still fatiguing quickly. After the session, her BP was 172/104; after sitting for 5 mins her second reading was 148/97, and she no longer reported feeling lightheaded. She was advised to talk to her PCP about her BP. She would benefit from PT to further address knee pain and muscle weakness.    Personal Factors and Comorbidities  Age;Profession    Examination-Activity Limitations  Locomotion Level;Transfers;Bend;Sit;Carry;Sleep;Squat;Stairs;Stand    Examination-Participation  Restrictions  Meal Prep;Driving;Other    Stability/Clinical Decision Making  Stable/Uncomplicated    Clinical Decision Making  Moderate    Rehab Potential  Good    PT Frequency  2x / week    PT Duration  6 weeks    PT Treatment/Interventions  ADLs/Self Care Home Management;Cryotherapy;Electrical Stimulation;Gait training;Moist Heat;Stair training;Functional mobility training;Therapeutic activities;Therapeutic exercise;Balance training;Neuromuscular re-education;Manual techniques;Patient/family education;Passive range of motion;Dry needling;Taping    PT Next Visit Plan  Take BP before and after session    PT Home Exercise Plan  LAQ, heel-toe gait, ice, NRPGGRLL, Clamshells, Mini Wall Squats       Patient will benefit from skilled therapeutic intervention in order to improve the following deficits and impairments:  Abnormal gait, Decreased range of motion, Difficulty walking, Decreased activity tolerance, Pain, Increased muscle spasms, Improper body mechanics, Impaired flexibility, Decreased mobility, Decreased strength, Increased edema, Hypermobility  Visit Diagnosis: Acute pain of left knee  Stiffness of left knee, not elsewhere classified  Difficulty in walking, not elsewhere classified  Muscle weakness (generalized)     Problem List Patient Active Problem List   Diagnosis Date Noted  . Osteopenia 11/10/2017  . OSA on CPAP 01/12/2016  . Insomnia with sleep apnea 01/12/2016  . Essential hypertension 11/30/2015  . Hyperlipidemia 11/30/2015    Brittney Williamson, SPT 08/10/2019, 5:00 PM  [facility] [address] [place], [place], [address] Phone: [phone]   Fax:  [phone]  Name: Brittney Williamson MRN: [id number] Date of Birth: [birth year]

## 2019-08-12 ENCOUNTER — Encounter: Payer: Self-pay | Admitting: Physical Therapy

## 2019-08-12 ENCOUNTER — Other Ambulatory Visit: Payer: Self-pay

## 2019-08-12 ENCOUNTER — Ambulatory Visit: Payer: Medicare Other | Admitting: Physical Therapy

## 2019-08-12 VITALS — BP 144/90

## 2019-08-12 DIAGNOSIS — M25662 Stiffness of left knee, not elsewhere classified: Secondary | ICD-10-CM

## 2019-08-12 DIAGNOSIS — R262 Difficulty in walking, not elsewhere classified: Secondary | ICD-10-CM

## 2019-08-12 DIAGNOSIS — M6281 Muscle weakness (generalized): Secondary | ICD-10-CM | POA: Diagnosis not present

## 2019-08-12 DIAGNOSIS — M25562 Pain in left knee: Secondary | ICD-10-CM

## 2019-08-12 NOTE — Therapy (Addendum)
[place], [place], [address] Phone: [phone]   Fax:  [phone]  Physical Therapy Treatment  Patient Details  Name: Brittney Williamson  MRN: [id number] Date of Birth: 1950/12/01 Referring Provider (PT): Vickey Huger, MD   Encounter Date: 08/12/2019  PT End of Session - 08/12/19 1641    Visit Number  16    Number of Visits  18    Date for PT Re-Evaluation  08/21/19    Authorization Type  MCR    Progress Note Due on Visit  20    PT Start Time  1545    PT Stop Time  1640    PT Time Calculation (min)  55 min    Activity Tolerance  Patient tolerated treatment well    Behavior During Therapy  [facility] for tasks assessed/performed       Past Medical History:  Diagnosis Date  . Mitral valve prolapse 1993  . OSA on CPAP     Past Surgical History:  Procedure Laterality Date  . KNEE ARTHROSCOPY WITH MEDIAL MENISECTOMY Left 05/27/2019  . WRIST SURGERY Right 2017    Vitals:   08/12/19 1558  BP: (!) 144/90    Subjective Assessment - 08/12/19 1624    Subjective  Patient reports that she drove for 5 hours today. She has no knee pain coming in. She says that she has been complying with her HEP.    Patient Stated Goals  back to exercise. garden, eat well    Pain Score  0-No pain    Pain Location  Knee    Pain Orientation  Left    Pain Descriptors / Indicators  Aching    Pain Type  Surgical pain    Pain Onset  More than a month ago    Pain Frequency  Constant    Pain Relieving Factors  movement, NuStep                       [facility] Adult PT Treatment/Exercise - 08/12/19 0001      Transfers   Transfers  --      Knee/Hip Exercises: Machines for Strengthening   Other Machine  Bike L2 x 5 mins      Knee/Hip Exercises: Standing   Heel Raises  Both;3 sets;10 reps    Functional Squat  3 sets;10 reps    Functional Squat Limitations  On AIrex Pad    Other Standing Knee Exercises  Tandem  stance cervical rotation left and right, UE horizontal add/abd    Other Standing Knee Exercises  Airex SLS       Knee/Hip Exercises: Seated   Sit to Sand  3 sets;10 reps      Cryotherapy   Number Minutes Cryotherapy  10 Minutes    Cryotherapy Location  Knee    Type of Cryotherapy  Ice pack                  PT Long Term Goals - 07/13/19 1550      PT LONG TERM GOAL #1   Title  Pt will be independent in techniques to avoid 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    Status  Achieved      PT LONG TERM GOAL #2   Title  pt will demo proper squat and lift technique to grab objects from the floor    Baseline  avoids squatting  and bends over to reach floor    Status  On-going      PT LONG TERM GOAL #3   Title  pt will be able to garden as previously able    Baseline  did gardening this weekend but now has 7/10 pain; was not making any modifications      PT LONG TERM GOAL #4   Title  up/down from floor indepedently    Baseline  able with difficulty, has to use things to pull on    Status  On-going      PT LONG TERM GOAL #5   Title  gross LE strength 5/5    Baseline  see flowsheet    Status  On-going            Plan - 08/12/19 1634    Clinical Impression Statement  Patient presents to the clinic with no knee pain. BP was monitored before and after session. Both readings were elevated. Patient often mentioned feeling fatigued and different during the session, stating "this isn't me." We discussed seeing a PCP to check on BP. We continued to work on balance and and strength. She is stronger on the Rt. side, and needed more cuing and assistance with SLS on the Lt. She complained of stiffness and pain in the knee when performing exercises. She would benefit from PT to further address pain and strength deficits.    Personal Factors and Comorbidities  Age;Profession    Examination-Activity Limitations  Locomotion Level;Transfers;Bend;Sit;Carry;Sleep;Squat;Stairs;Stand     Examination-Participation Restrictions  Meal Prep;Driving;Other    Rehab Potential  Good    PT Frequency  2x / week    PT Duration  6 weeks    PT Treatment/Interventions  ADLs/Self Care Home Management;Cryotherapy;Electrical Stimulation;Gait training;Moist Heat;Stair training;Functional mobility training;Therapeutic activities;Therapeutic exercise;Balance training;Neuromuscular re-education;Manual techniques;Patient/family education;Passive range of motion;Dry needling;Taping    PT Next Visit Plan  Take BP before and after session, LE strengthening and balance    PT Home Exercise Plan  LAQ, heel-toe gait, ice, NRPGGRLL, Clamshells, Mini Wall Squats    Consulted and Agree with Plan of Care  Patient       Patient will benefit from skilled therapeutic intervention in order to improve the following deficits and impairments:  Abnormal gait, Decreased range of motion, Difficulty walking, Decreased activity tolerance, Pain, Increased muscle spasms, Improper body mechanics, Impaired flexibility, Decreased mobility, Decreased strength, Increased edema, Hypermobility  Visit Diagnosis: Acute pain of left knee  Stiffness of left knee, not elsewhere classified  Difficulty in walking, not elsewhere classified  Muscle weakness (generalized)     Problem List Patient Active Problem List   Diagnosis Date Noted  . Osteopenia 11/10/2017  . OSA on CPAP 01/12/2016  . Insomnia with sleep apnea 01/12/2016  . Essential hypertension 11/30/2015  . Hyperlipidemia 11/30/2015    Brittney Williamson, Brittney Williamson 08/12/2019, 5:52 PM  [facility] [address] [place], [place], [address] Phone: [phone]   Fax:  [phone]  Name: Brittney Williamson MRN: [id number] Date of Birth: Jan 07, 1951

## 2019-08-13 ENCOUNTER — Other Ambulatory Visit: Payer: Self-pay

## 2019-08-13 ENCOUNTER — Ambulatory Visit (HOSPITAL_COMMUNITY)
Admission: EM | Admit: 2019-08-13 | Discharge: 2019-08-13 | Disposition: A | Discharge status: Home / Self Care | Payer: Medicare Other | Attending: Family Medicine | Admitting: Family Medicine

## 2019-08-13 DIAGNOSIS — M7989 Other specified soft tissue disorders: Secondary | ICD-10-CM

## 2019-08-13 DIAGNOSIS — E785 Hyperlipidemia, unspecified: Secondary | ICD-10-CM

## 2019-08-13 DIAGNOSIS — I10 Essential (primary) hypertension: Secondary | ICD-10-CM | POA: Diagnosis not present

## 2019-08-13 NOTE — ED Provider Notes (Signed)
[facility]    CSN: [id number] Arrival date & time: 08/13/19  1258      History   Chief Complaint Chief Complaint  Patient presents with  . Hypertension    HPI Brittney Williamson is a 69 y.o. female history of OSA, mitral valve prolapse, hypertension, hyperlipidemia presenting today for evaluation of elevated blood pressure.  Patient notes that she was at physical therapy yesterday and was noted to have blood pressure elevated at 180/105.  She is not currently on medicines for blood pressure as well as denies medicines for her cholesterol.  Expresses aversion to taking statins.  She prefers lifestyle modifications.  She has felt slightly lightheaded beginning yesterday, but denies headaches.  Denies current vision changes, but does report an episode approximately 1 week ago where she had blackening/loss of vision to right eye which was briefly for a few seconds.  She denied associated vision changes.  Symptoms fully resolved afterwards.  She also reports an episode within the past week where she had some central chest discomfort which woke her up out of sleep.  This resolved.  She denies shortness of breath.  .  Had left knee arthroscopy approximately 2 months ago, has had intermittent left leg swelling and discomfort associated with this.  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  Denies prior DVT/PE.  Denies prior tobacco use when she was younger, but denies any recent tobacco use.  HPI  Past Medical History:  Diagnosis Date  . Mitral valve prolapse 1993  . OSA on CPAP     Patient Active Problem List   Diagnosis Date Noted  . Osteopenia 11/10/2017  . OSA on CPAP 01/12/2016  . Insomnia with sleep apnea 01/12/2016  . Essential hypertension 11/30/2015  . Hyperlipidemia 11/30/2015    Past Surgical History:  Procedure Laterality Date  . KNEE ARTHROSCOPY WITH MEDIAL MENISECTOMY Left 05/27/2019  . WRIST SURGERY Right 2017    OB History     No obstetric history on file.      Home Medications    Prior to Admission medications   Medication Sig Start Date End Date Taking? Authorizing Provider  acyclovir (ZOVIRAX) 400 MG tablet Take 400 mg by mouth every 8 (eight) hours. 05/19/19   [provider]  B Complex Vitamins (B COMPLEX 1 PO) Take 1 tablet by mouth daily.     [provider]  ELDERBERRY PO Take 2 tablets by mouth daily.    [provider]  Melatonin 5 MG TABS Take 10 mg by mouth at bedtime.     [provider]  Multiple Vitamin (MULTIVITAMIN) tablet Take 1 tablet by mouth daily.    [provider]  naproxen sodium (ANAPROX) 220 MG tablet Take 440 mg by mouth as needed (for pain).     [provider]  vitamin B-12 (CYANOCOBALAMIN) 1000 MCG tablet Take 1,000 mcg by mouth daily.    [provider]  VITAMIN D PO Take 20,000 Units by mouth daily.     [provider]  zinc gluconate 50 MG tablet Take 50 mg by mouth daily as needed.     [provider]    Family History Family History  Adopted: Yes    Social History Social History   Tobacco Use  . Smoking status: Never Smoker  . Smokeless tobacco: Never Used  Substance Use Topics  . Alcohol use: Yes    Comment: occ  . Drug use:  Never     Allergies   Penicillins and Trimox [amoxicillin trihydrate]   Review of Systems Review of Systems  Constitutional: Negative for fatigue and fever.  HENT: Negative for congestion, sinus pressure and sore throat.   Eyes: Positive for visual disturbance. Negative for photophobia and pain.  Respiratory: Negative for cough and shortness of breath.   Cardiovascular: Positive for chest pain.  Gastrointestinal: Negative for abdominal pain, nausea and vomiting.  Genitourinary: Negative for decreased urine volume and hematuria.  Musculoskeletal: Negative for myalgias, neck pain and neck stiffness.  Neurological: Positive for light-headedness.  Negative for dizziness, syncope, facial asymmetry, speech difficulty, weakness, numbness and headaches.     Physical Exam Triage Vital Signs ED Triage Vitals  Enc Vitals Group     BP 08/13/19 1318 (!) 179/106     Pulse Rate 08/13/19 1318 79     Resp 08/13/19 1318 18     Temp 08/13/19 1318 99.2 F (37.3 C)     Temp Source 08/13/19 1318 Oral     SpO2 08/13/19 1318 98 %     Weight 08/13/19 1319 174 lb (78.9 kg)     Height --      Head Circumference --      Peak Flow --      Pain Score 08/13/19 1319 0     Pain Loc --      Pain Edu? --      Excl. in [place]? --    No data found.  Updated Vital Signs BP (!) 158/91   Pulse 79   Temp 99.2 F (37.3 C) (Oral)   Resp 18   Wt 174 lb (78.9 kg)   SpO2 98%   BMI 28.96 kg/m   Visual Acuity Right Eye Distance:   Left Eye Distance:   Bilateral Distance:    Right Eye Near:   Left Eye Near:    Bilateral Near:     Physical Exam Vitals and nursing note reviewed.  Constitutional:      Appearance: She is well-developed.     Comments: No acute distress  HENT:     Head: Normocephalic and atraumatic.     Ears:     Comments: Bilateral ears without tenderness to palpation of external auricle, tragus and mastoid, EAC's without erythema or swelling, TM's with good bony landmarks and cone of light. Non erythematous.     Nose: Nose normal.     Mouth/Throat:     Comments: Oral mucosa pink and moist, no tonsillar enlargement or exudate. Posterior pharynx patent and nonerythematous, no uvula deviation or swelling. Normal phonation.  Eyes:     Extraocular Movements: Extraocular movements intact.     Conjunctiva/sclera: Conjunctivae normal.     Pupils: Pupils are equal, round, and reactive to light.  Cardiovascular:     Rate and Rhythm: Normal rate and regular rhythm.     Comments: No carotid bruits auscultated Pulmonary:     Effort: Pulmonary effort is normal. No respiratory distress.     Comments: Breathing comfortably at rest, CTABL, no  wheezing, rales or other adventitious sounds auscultated Abdominal:     General: There is no distension.  Musculoskeletal:        General: Normal range of motion.     Cervical back: Neck supple.     Comments: Left lower leg: Mild lower leg swelling compared to right, no calf tenderness, no overlying erythema Dorsalis pedis 2+  Skin:    General: Skin is warm and dry.  Neurological:  Mental Status: She is alert and oriented to person, place, and time.      UC Treatments / Results  Labs (all labs ordered are listed, but only abnormal results are displayed) Labs Reviewed - No data to display  EKG   Radiology No results found.  Procedures Procedures (including critical care time)  Medications Ordered in UC Medications - No data to display  Initial Impression / Assessment and Plan / UC Course  I have reviewed the triage vital signs and the nursing notes.  Pertinent labs & imaging results that were available during my care of the patient were reviewed by me and considered in my medical decision making (see chart for details).     1.  Hypertension-blood pressure improved on rechecks, prior to discharge, rechecked additional time and was 155/84.  Patient wished to hold off on starting any medicines at this time and wished to proceed with lifestyle modifications.  Discussed concerns with possible TIA symptoms recently.  Stressed importance of follow-up with cardiology and blood pressure recheck in 1 to 2 weeks.  Provided information on DASH diet.  EKG normal sinus rhythm, no acute signs of ischemia or infarction.  2.  Leg swelling-seems like dependent edema and has been present since arthroscopic surgery, discussed with patient concerns for possible DVT given unilateral nature.  Offered setting up outpatient ultrasound.  Patient wished to proceed with continued monitoring and elevation as this comes and goes depending on amount of driving she has to do for her job.  No obvious  signs of DVT, opting to defer, but advised patient if she develops any increased pain swelling or redness to leg to follow-up to have this evaluated.  Discussed strict return precautions. Patient verbalized understanding and is agreeable with plan.    Final Clinical Impressions(s) / UC Diagnoses   Final diagnoses:  Essential hypertension  Hyperlipidemia, unspecified hyperlipidemia type  Left leg swelling     Discharge Instructions     Please monitor blood pressure at home if you are able Read DASH diet Please follow-up with cardiology as soon as possible for further evaluation Please also establish care with a new primary care  If you develop any severe headaches, vision changes, difficulty speaking, facial drooping, one-sided weakness, increased lightheadedness/dizziness, confusion, chest pain, shortness of breath please follow-up in the emergency room immediately    ED Prescriptions    None     PDMP not reviewed this encounter.   Janith Lima, [place] 08/13/19 1434

## 2019-08-13 NOTE — ED Triage Notes (Signed)
Pt states she was told to come by and have her B/P check because it was high in PT today. Pt states she has been feeling light headed.

## 2019-08-13 NOTE — Discharge Instructions (Signed)
Please monitor blood pressure at home if you are able Read DASH diet Please follow-up with cardiology as soon as possible for further evaluation Please also establish care with a new primary care  If you develop any severe headaches, vision changes, difficulty speaking, facial drooping, one-sided weakness, increased lightheadedness/dizziness, confusion, chest pain, shortness of breath please follow-up in the emergency room immediately

## 2019-08-17 ENCOUNTER — Ambulatory Visit: Payer: Medicare Other | Admitting: Physical Therapy

## 2019-08-18 ENCOUNTER — Encounter: Payer: Self-pay | Admitting: Cardiology

## 2019-08-18 ENCOUNTER — Other Ambulatory Visit: Payer: Self-pay

## 2019-08-18 ENCOUNTER — Ambulatory Visit (INDEPENDENT_AMBULATORY_CARE_PROVIDER_SITE_OTHER): Payer: Medicare Other | Admitting: Cardiology

## 2019-08-18 VITALS — BP 130/80 | HR 90 | Ht 65.0 in | Wt 171.0 lb

## 2019-08-18 DIAGNOSIS — I10 Essential (primary) hypertension: Secondary | ICD-10-CM | POA: Diagnosis not present

## 2019-08-18 DIAGNOSIS — I341 Nonrheumatic mitral (valve) prolapse: Secondary | ICD-10-CM | POA: Diagnosis not present

## 2019-08-18 DIAGNOSIS — G453 Amaurosis fugax: Secondary | ICD-10-CM | POA: Diagnosis not present

## 2019-08-18 NOTE — Patient Instructions (Signed)
Medication Instructions:  The current medical regimen is effective;  continue present plan and medications.  *If you need a refill on your cardiac medications before your next appointment, please call your pharmacy*  Testing/Procedures: Your physician has requested that you have an echocardiogram. Echocardiography is a painless test that uses sound waves to create images of your heart. It provides your doctor with information about the size and shape of your heart and how well your heart's chambers and valves are working. This procedure takes approximately one hour. There are no restrictions for this procedure.  Your physician has requested that you have a carotid duplex. This test is an ultrasound of the carotid arteries in your neck. It looks at blood flow through these arteries that supply the brain with blood. Allow one hour for this exam. There are no restrictions or special instructions.  Follow-Up: At [facility], you and your health needs are our priority.  As part of our continuing mission to provide you with exceptional heart care, we have created designated Provider Care Teams.  These Care Teams include your primary Cardiologist (physician) and Advanced Practice Providers (APPs -  Physician Assistants and Nurse Practitioners) who all work together to provide you with the care you need, when you need it.  We recommend signing up for the patient portal called "MyChart".  Sign up information is provided on this After Visit Summary.  MyChart is used to connect with patients for Virtual Visits (Telemedicine).  Patients are able to view lab/test results, encounter notes, upcoming appointments, etc.  Non-urgent messages can be sent to your provider as well.   To learn more about what you can do with MyChart, go to [email or web address].    Your next appointment:   2 month(s)  The format for your next appointment:   In Person  Provider:   Hypertension Clinic at [facility] with  pharmacist.  Thank you for choosing [facility]!!

## 2019-08-18 NOTE — Progress Notes (Signed)
Cardiology Office Note:    Date:  08/18/2019   ID:  Brittney Williamson, DOB 1951-02-17, MRN [id number]  PCP:  Rutherford Guys, MD  Cardiologist:  No primary care provider on file.  Electrophysiologist:  None   Referring MD: Rutherford Guys, MD     History of Present Illness:    Brittney Williamson is a 69 y.o. female here for the referral of hypertension at the request of Dr. Pamella Pert.  In review of prior urgent care center note she is female with history of sleep apnea mitral valve prolapse hypertension hyperlipidemia.  She was at the physical therapist and her blood pressure was 180/105.  She was on no medicines.  She does not wish to take statins.  She prefers lifestyle modifications.  She did have a slight headache but denies overall visual or normal headaches.  She did report where about 2 weeks ago she had a blackening or loss of vision to her right eye which was brief for a few seconds.  Symptoms fully resolved.  No shortness of breath.  She did report episode also where she had some central chest discomfort which woke her out of sleep.  She also had her knee operated on with arthroscopy a few months ago and some leg swelling and discomfort with this.  She is a Loss adjuster, chartered.  No tobacco use.  Has a prior history of mitral valve prolapse.  Wears CPAP mask.  She is adopted.  Blood pressure at the urgent care was 158/91.  Blood pressure on repeat was 155/84.  At that time, she wished to hold off on starting any medications and wanted to try lifestyle modifications.  There was concern there over transient right eye blindness or amaurosis fugax.  EKG was normal.  She works for [employer] car.  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  She has visited [place].  Past Medical History:  Diagnosis Date  . Mitral valve prolapse 1993  . OSA on CPAP     Past Surgical History:  Procedure Laterality Date  .  KNEE ARTHROSCOPY WITH MEDIAL MENISECTOMY Left 05/27/2019  . WRIST SURGERY Right 2017    Current Medications: Current Meds  Medication Sig  . acyclovir (ZOVIRAX) 400 MG tablet Take 400 mg by mouth every 8 (eight) hours.  . B Complex Vitamins (B COMPLEX 1 PO) Take 1 tablet by mouth daily.   Marland Kitchen ELDERBERRY PO Take 2 tablets by mouth daily.  . Melatonin 5 MG TABS Take 10 mg by mouth at bedtime.   . Multiple Vitamin (MULTIVITAMIN) tablet Take 1 tablet by mouth daily.  . naproxen sodium (ANAPROX) 220 MG tablet Take 440 mg by mouth as needed (for pain).   . vitamin B-12 (CYANOCOBALAMIN) 1000 MCG tablet Take 1,000 mcg by mouth daily.  Marland Kitchen VITAMIN D PO Take 20,000 Units by mouth daily.   Marland Kitchen zinc gluconate 50 MG tablet Take 50 mg by mouth daily as needed.      Allergies:   Penicillins and Trimox [amoxicillin trihydrate]   Social History   Socioeconomic History  . Marital status: Single    Spouse name: Not on file  . Number of children: Not on file  . Years of education: Not on file  . Highest education level: Not on file  Occupational History  . Not on file  Tobacco Use  . Smoking status: Never Smoker  .  Smokeless tobacco: Never Used  Substance and Sexual Activity  . Alcohol use: Yes    Comment: occ  . Drug use: Never  . Sexual activity: Not Currently  Other Topics Concern  . Not on file  Social History Narrative  . Not on file   Social Determinants of Health   Financial Resource Strain:   . Difficulty of Paying Living Expenses:   Food Insecurity:   . Worried About Charity fundraiser in the Last Year:   . Arboriculturist in the Last Year:   Transportation Needs:   . Film/video editor (Medical):   Marland Kitchen Lack of Transportation (Non-Medical):   Physical Activity:   . Days of Exercise per Week:   . Minutes of Exercise per Session:   Stress:   . Feeling of Stress :   Social Connections:   . Frequency of Communication with Friends and Family:   . Frequency of Social  Gatherings with Friends and Family:   . Attends Religious Services:   . Active Member of Clubs or Organizations:   . Attends Archivist Meetings:   Marland Kitchen Marital Status:      Family History: The patient's family history is not on file. She was adopted.  ROS:   Please see the history of present illness.    Denies any fevers chills nausea vomiting syncope bleeding all other systems reviewed and are negative.  EKGs/Labs/Other Studies Reviewed:    The following studies were reviewed today: Prior office notes reviewed  EKG:  EKG is  ordered today.  The ekg ordered today demonstrates 08/13/2019-sinus rhythm no other changes.  Recent Labs: No results found for requested labs within last 8760 hours.  Recent Lipid Panel    Component Value Date/Time   CHOL 238 (H) 04/14/2018 0919   CHOL 308 (H) 11/08/2017 1402   TRIG 339.0 (H) 04/14/2018 0919   HDL 38.30 (L) 04/14/2018 0919   HDL 38 (L) 11/08/2017 1402   CHOLHDL 6 04/14/2018 0919   VLDL 67.8 (H) 04/14/2018 0919   LDLCALC Comment 11/08/2017 1402   LDLDIRECT 57.0 04/14/2018 0919    Physical Exam:    VS:  BP 130/80   Pulse 90   Ht 5\' 5"  (1.651 m)   Wt 171 lb (77.6 kg)   SpO2 96%   BMI 28.46 kg/m     Wt Readings from Last 3 Encounters:  08/18/19 171 lb (77.6 kg)  08/13/19 174 lb (78.9 kg)  06/02/19 172 lb 6.4 oz (78.2 kg)     GEN:  Well nourished, well developed in no acute distress HEENT: Normal NECK: No JVD; No carotid bruits LYMPHATICS: No lymphadenopathy CARDIAC: RRR, no murmurs, rubs, gallops RESPIRATORY:  Clear to auscultation without rales, wheezing or rhonchi  ABDOMEN: Soft, non-tender, non-distended MUSCULOSKELETAL:  No edema; No deformity  SKIN: Warm and dry NEUROLOGIC:  Alert and oriented x 3 PSYCHIATRIC:  Normal affect   ASSESSMENT:    1. Essential hypertension   2. Amaurosis fugax   3. Mitral valve prolapse    PLAN:    In order of problems listed above:  Essential hypertension -Continue  to check home blood pressures.  She prefers lifestyle modifications.  In chart review, she has visited the emergency department in 2018 secondary to high blood pressure.  At that time she was 162/89.  Today her blood pressure was 130/80.  She was pleasantly surprised.  She is ordered the blood pressure cuff to take at home.  She will continue  to monitor.  In about 2 months I would like for her to visit our hypertension clinic to see how she is doing.  I did tell her that she may need some pharmacologic therapy to help her at this point in she was quite hesitant.  Amaurosis fugax -I would like for her to get carotid Dopplers and an echocardiogram.  We will order. -Several years ago in 2006 she had a retinal detachment repair at [facility].  This symptom lasted for about 10 to 15 seconds.  Right eye.  Mitral valve prolapse -She has carried this diagnosis before.  We will check echocardiogram.   Medication Adjustments/Labs and Tests Ordered: Current medicines are reviewed at length with the patient today.  Concerns regarding medicines are outlined above.  Orders Placed This Encounter  Procedures  . AMB REFERRAL TO ADVANCED HTN CLINIC  . ECHOCARDIOGRAM COMPLETE  . VAS US CAROTID   No orders of the defined types were placed in this encounter.   Patient Instructions  Medication Instructions:  The current medical regimen is effective;  continue present plan and medications.  *If you need a refill on your cardiac medications before your next appointment, please call your pharmacy*  Testing/Procedures: Your physician has requested that you have an echocardiogram. Echocardiography is a painless test that uses sound waves to create images of your heart. It provides your doctor with information about the size and shape of your heart and how well your heart's chambers and valves are working. This procedure takes approximately one hour. There are no restrictions for this procedure.  Your physician  has requested that you have a carotid duplex. This test is an ultrasound of the carotid arteries in your neck. It looks at blood flow through these arteries that supply the brain with blood. Allow one hour for this exam. There are no restrictions or special instructions.  Follow-Up: At [facility], you and your health needs are our priority.  As part of our continuing mission to provide you with exceptional heart care, we have created designated Provider Care Teams.  These Care Teams include your primary Cardiologist (physician) and Advanced Practice Providers (APPs -  Physician Assistants and Nurse Practitioners) who all work together to provide you with the care you need, when you need it.  We recommend signing up for the patient portal called "MyChart".  Sign up information is provided on this After Visit Summary.  MyChart is used to connect with patients for Virtual Visits (Telemedicine).  Patients are able to view lab/test results, encounter notes, upcoming appointments, etc.  Non-urgent messages can be sent to your provider as well.   To learn more about what you can do with MyChart, go to [email or web address].    Your next appointment:   2 month(s)  The format for your next appointment:   In Person  Provider:   Hypertension Clinic at [facility] with pharmacist.  Thank you for choosing [facility]!!         Signed, Candee Furbish, MD  08/18/2019 4:54 PM    [place]

## 2019-08-19 ENCOUNTER — Ambulatory Visit: Payer: Medicare Other | Admitting: Physical Therapy

## 2019-08-19 ENCOUNTER — Encounter: Payer: Self-pay | Admitting: Physical Therapy

## 2019-08-19 DIAGNOSIS — M6281 Muscle weakness (generalized): Secondary | ICD-10-CM | POA: Diagnosis not present

## 2019-08-19 DIAGNOSIS — R262 Difficulty in walking, not elsewhere classified: Secondary | ICD-10-CM

## 2019-08-19 DIAGNOSIS — M25562 Pain in left knee: Secondary | ICD-10-CM

## 2019-08-19 DIAGNOSIS — M25662 Stiffness of left knee, not elsewhere classified: Secondary | ICD-10-CM

## 2019-08-19 NOTE — Therapy (Signed)
[place], [place], [address] Phone: [phone]   Fax:  [phone]  Physical Therapy Treatment/Discharge   Patient Details  Name: Brittney Williamson MRN: [id number] Date of Birth: 1950/08/05 Referring Provider (PT): Vickey Huger, MD   Encounter Date: 08/19/2019  PT End of Session - 08/19/19 1548    Visit Number  17    Number of Visits  18    Date for PT Re-Evaluation  08/21/19    Authorization Type  MCR    Progress Note Due on Visit  20    PT Start Time  1548    PT Stop Time  1621    PT Time Calculation (min)  33 min    Activity Tolerance  Patient tolerated treatment well    Behavior During Therapy  [facility] for tasks assessed/performed       Past Medical History:  Diagnosis Date  . Mitral valve prolapse 1993  . OSA on CPAP     Past Surgical History:  Procedure Laterality Date  . KNEE ARTHROSCOPY WITH MEDIAL MENISECTOMY Left 05/27/2019  . WRIST SURGERY Right 2017    There were no vitals filed for this visit.  Subjective Assessment - 08/19/19 1609    Subjective  Patient reports that therapy has given her the tools to focus on the muscles that she should be using. She believes that she has regained some strength and has been able to get back out in her garden. She would like to make further improvements with going up and down her stairs. Patient still needs help with finding comfortable sleep positions.    Limitations  Standing;Walking    How long can you stand comfortably?  15 mins    How long can you walk comfortably?  10 mins    Patient Stated Goals  back to exercise. garden, eat well    Currently in Pain?  No/denies    Pain Location  Knee    Pain Orientation  Left    Pain Descriptors / Indicators  Aching    Pain Type  Surgical pain    Pain Onset  More than a month ago    Pain Frequency  Intermittent    Aggravating Factors   Increasing activity, sitting for long time    Pain Relieving Factors   movement, NuStep         [facility] PT Assessment - 08/19/19 0001      AROM   Left Knee Extension  -1    Left Knee Flexion  132      Strength   Right Hip ADduction  4+/5    Left Hip Flexion  5/5    Left Hip ABduction  4/5    Right Knee Flexion  5/5    Right Knee Extension  5/5    Left Knee Flexion  5/5    Left Knee Extension  5/5                           PT Education - 08/19/19 1630    Education Details  Patient educated on progress and importance of continuing HEP    Person(s) Educated  Patient    Methods  Explanation    Comprehension  Verbalized understanding          PT Long Term Goals - 08/19/19 1604      PT LONG TERM GOAL #1   Title  Pt will be independent in techniques to  avoid 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 gets out in garden and mows, but not as much as before    Status  Partially Met      PT LONG TERM GOAL #4   Title  up/down from floor indepedently    Baseline  --    Status  Achieved      PT LONG TERM GOAL #5   Title  gross LE strength 5/5    Baseline  Lt. Hip abd 4+/5    Status  Partially Met            Plan - 08/19/19 1615    Clinical Impression Statement  Patient presents to the clinic with no knee pain, but reports that she fell and broke her toes. No imaging has been done, but patient reports a great amount of pain. Patient previously achieved FOTO goal on March 22nd, but regressed due to falling accident. Her gross LE strength was a 5/5 except for Lt./Rt. hip abduction, which was 4/5 (Lt) and 4+/5 (Rt.). Patient was able to achieve 132 degrees of knee flexion, and has achieved or partially met all of her goals. We discussed her progress, and agreed to  discharge her due to improvements with pain, strength, and ROM.    Personal Factors and Comorbidities  Age;Profession    Examination-Activity Limitations  Locomotion Level;Transfers;Bend;Sit;Carry;Sleep;Squat;Stairs;Stand    Stability/Clinical Decision Making  Stable/Uncomplicated    Clinical Decision Making  Moderate    Rehab Potential  Good    PT Frequency  2x / week    PT Duration  6 weeks    PT Treatment/Interventions  ADLs/Self Care Home Management;Cryotherapy;Electrical Stimulation;Gait training;Moist Heat;Stair training;Functional mobility training;Therapeutic activities;Therapeutic exercise;Balance training;Neuromuscular re-education;Manual techniques;Patient/family education;Passive range of motion;Dry needling;Taping    PT Home Exercise Plan  LAQ, heel-toe gait, ice, NRPGGRLL, Clamshells, Mini Wall Squats    Consulted and Agree with Plan of Care  Patient       Patient will benefit from skilled therapeutic intervention in order to improve the following deficits and impairments:  Abnormal gait, Decreased range of motion, Difficulty walking, Decreased activity tolerance, Pain, Increased muscle spasms, Improper body mechanics, Impaired flexibility, Decreased mobility, Decreased strength, Increased edema, Hypermobility  Visit Diagnosis: Acute pain of left knee  Stiffness of left knee, not elsewhere classified  Difficulty in walking, not elsewhere classified  Muscle weakness (generalized)     Problem List Patient Active Problem List   Diagnosis Date Noted  . Osteopenia 11/10/2017  . OSA on CPAP 01/12/2016  . Insomnia with sleep apnea 01/12/2016  . Essential hypertension 11/30/2015  . Hyperlipidemia 11/30/2015    Laveda Norman, SPT 08/19/2019, 4:35 PM  [facility] [address] [place], [place], [address] Phone: [phone]   Fax:  [phone]  Name: Brittney Williamson MRN: [id number] Date of Birth: 1950/10/09     PHYSICAL THERAPY DISCHARGE SUMMARY  Visits from Start of Care: 17  Current functional level related to goals / functional outcomes: FOTO 37% limited   Remaining deficits: Hip abduction strength   Education / Equipment: HEP,  TheraBands Plan: Patient agrees to discharge.  Patient goals were partially met. Patient is being discharged due to not returning since the last visit.  ?????

## 2019-08-24 DIAGNOSIS — H2513 Age-related nuclear cataract, bilateral: Secondary | ICD-10-CM | POA: Diagnosis not present

## 2019-08-24 DIAGNOSIS — H33311 Horseshoe tear of retina without detachment, right eye: Secondary | ICD-10-CM | POA: Diagnosis not present

## 2019-08-28 ENCOUNTER — Ambulatory Visit (HOSPITAL_COMMUNITY)
Admission: RE | Admit: 2019-08-28 | Discharge: 2019-08-28 | Disposition: A | Discharge status: Home / Self Care | Payer: Medicare Other | Source: Ambulatory Visit | Attending: Cardiology | Admitting: Cardiology

## 2019-08-28 ENCOUNTER — Other Ambulatory Visit: Payer: Self-pay

## 2019-08-28 DIAGNOSIS — G453 Amaurosis fugax: Secondary | ICD-10-CM

## 2019-09-07 ENCOUNTER — Other Ambulatory Visit: Payer: Self-pay

## 2019-09-07 ENCOUNTER — Ambulatory Visit (INDEPENDENT_AMBULATORY_CARE_PROVIDER_SITE_OTHER): Payer: Medicare Other | Admitting: Pharmacist

## 2019-09-07 ENCOUNTER — Ambulatory Visit (HOSPITAL_COMMUNITY): Payer: Medicare Other | Attending: Cardiology

## 2019-09-07 VITALS — HR 72

## 2019-09-07 DIAGNOSIS — G453 Amaurosis fugax: Secondary | ICD-10-CM | POA: Diagnosis not present

## 2019-09-07 DIAGNOSIS — I341 Nonrheumatic mitral (valve) prolapse: Secondary | ICD-10-CM | POA: Insufficient documentation

## 2019-09-07 DIAGNOSIS — I10 Essential (primary) hypertension: Secondary | ICD-10-CM

## 2019-09-07 NOTE — Patient Instructions (Signed)
It was a pleasure to meet you.  Please start checking your blood pressure once a day.  Stop eating fast food and drinking soda, reduce sweet  Reduce salt intake- no more more than 2300mg  per day  Try to start walking. Start small and increase as tolerated  Try not to use Aleve. Use Tylenol instead (max 3000mg /day)  Call us at [phone] with any questions.

## 2019-09-07 NOTE — Progress Notes (Signed)
Patient ID: Brittney Williamson                 DOB: 1950/09/05                      MRN: [id number]     HPI: Brittney Williamson is a 69 y.o. female referred by Dr. [name] to HTN clinic. PMH is significant for sleep apnea mitral valve prolapse hypertension hyperlipidemia. Patient has been seen at urgent care and ER a few times for high blood pressure. She saw Dr. [name] in April. Her blood pressure was 130/80 and she was pleasantly surprised. Very hesitant to start medications and prefers lifestyle modifications. She was given BP cuff and asked to monitor at home. She was referred to HTN clinic for follow up.  Patient presents today to HTN clinic. She just received her blood pressure cuff today, therefore she has not been checking her blood pressure at home. She has been in some pain. Was getting physical therapy for her knee and broke her toes on the other foot. She has been taking Aleve and Tylenol for pain. Does not do aerobic exercise due to knee. Does mow her lawn and walk at work. As never been on blood pressure medications before. 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 Wishes to do lifestyle modifications. Has sleep apnea, does wear her CPAP.  She eats a decent amount of fast food, soda, and has a sweet tooth. On the road a lot and finds it easy to stop for fast food. Blood pressure on first check was 158/92. On repeat 140/88.  Patient TG are also elevated (per labs 1 year ago). LDL was actually well controlled. Patient refuses statin due to reports of memory issues and personal history of muscle pains with atorvastatin. We did discuss that not all statins are the same.   Current HTN meds: none Previously tried: none BP goal: <130/80  Family History: The patient's family history is not on file. She was adopted.  Social History: never smoked, + ETOH  Diet: large cup (3 cups) of coffee every day Doesn't cook with salt Rarely salts things No frozen meals, no deli  meats, no canned food T[employer], [employer], [employer] this week  Exercise: none  Home BP readings: none  Wt Readings from Last 3 Encounters:  08/18/19 171 lb (77.6 kg)  08/13/19 174 lb (78.9 kg)  06/02/19 172 lb 6.4 oz (78.2 kg)   BP Readings from Last 3 Encounters:  08/18/19 130/80  08/13/19 (!) 158/91  08/12/19 (!) 144/90   Pulse Readings from Last 3 Encounters:  08/18/19 90  08/13/19 79  06/02/19 (!) 105    Renal function: CrCl cannot be calculated (Patient's most recent lab result is older than the maximum 21 days allowed.).  Past Medical History:  Diagnosis Date  . Mitral valve prolapse 1993  . OSA on CPAP     Current Outpatient Medications on File Prior to Visit  Medication Sig Dispense Refill  . acyclovir (ZOVIRAX) 400 MG tablet Take 400 mg by mouth every 8 (eight) hours.    . B Complex Vitamins (B COMPLEX 1 PO) Take 1 tablet by mouth daily.     Marland Kitchen ELDERBERRY PO Take 2 tablets by mouth daily.    . Melatonin 5 MG TABS Take 10 mg by mouth at bedtime.     . Multiple Vitamin (MULTIVITAMIN) tablet Take 1 tablet by mouth daily.    Marland Kitchen  naproxen sodium (ANAPROX) 220 MG tablet Take 440 mg by mouth as needed (for pain).     . vitamin B-12 (CYANOCOBALAMIN) 1000 MCG tablet Take 1,000 mcg by mouth daily.    Marland Kitchen VITAMIN D PO Take 20,000 Units by mouth daily.     Marland Kitchen zinc gluconate 50 MG tablet Take 50 mg by mouth daily as needed.      No current facility-administered medications on file prior to visit.    Allergies  Allergen Reactions  . Penicillins Itching  . Trimox [Amoxicillin Trihydrate] Itching    There were no vitals taken for this visit.   Assessment/Plan:  1. Hypertension - Blood pressure is above goal of <130/80 in clinic today. No home reading available. Patient just got her home BP cuff today. Will start taking at home. Reviewed proper technique with patient. Patient does not wish to start blood pressure medication. I will give a chance for lifestyle modifications.  I have asked the patient to stop eating fast food. I encouraged her to pack healthy snacks such nuts, fruit, vegetables to avoid making knee jerk decisions to eat fast food. I have also asked her to stop drinking soda and to limit her sweets. We discussed carbs/sweets roll in increasing TG. Also fast food is often very high in sodium. I have asked her to stop using salt and to limit salt intake to 2300mg  per day. We were limited on time today and patient would benefit from further diet counseling. But I gave her a few goals to start with. I have also asked her to stop using NSAIDs such as Aleve since it will raise her blood pressure. Seems like Tylenol is effective. I have reviewed proper blood pressure checking technique with patient and have asked her to check her blood pressure once a day. She will follow up in clinic in 1 month.   Thank you  Ramond Dial, Pharm.D, BCPS, CPP [place]  [id number], [place], [place] [address]  Phone: [phone]; Fax: [phone]

## 2019-09-09 ENCOUNTER — Telehealth: Payer: Self-pay

## 2019-09-09 DIAGNOSIS — E785 Hyperlipidemia, unspecified: Secondary | ICD-10-CM

## 2019-09-09 NOTE — Telephone Encounter (Signed)
-----   Message from Jerline Pain, MD sent at 09/01/2019  7:11 AM EDT ----- Mild right carotid plaque Would recommend Crestor 10mg  PO QD.  She has been hesitant to take medications like statins. I think it would be a good idea given evidence of mild plaque.  Would be ok with her discussing further with Mickel Baas if needed. Candee Furbish, MD

## 2019-09-09 NOTE — Telephone Encounter (Signed)
Called patient to move appointment to an earlier time. Left message for patient to call back.

## 2019-09-09 NOTE — Telephone Encounter (Signed)
LMTCB

## 2019-09-09 NOTE — Telephone Encounter (Signed)
Pt verbalized understanding of her carotid US... she is still declining to start Crestor.. she spoke with the [facility] this past Monday. She will work hard at diet modification.   Pt is seeing the HTN clinic again 10/05/19 at 1:30pm but would like to see if she can have a fasting Lipid panel the same day she comes in but would need orders and an earlier appt since she will be fasting.   I will forward to the [facility] for review and orders.   Pt still waiting for her Echo results.

## 2019-09-25 DIAGNOSIS — H2513 Age-related nuclear cataract, bilateral: Secondary | ICD-10-CM | POA: Diagnosis not present

## 2019-10-04 NOTE — Progress Notes (Signed)
Patient ID: Brittney Williamson                 DOB: 11-Sep-1950                      MRN: [id number]     HPI: Brittney Williamson is a 69 y.o. female referred by Dr. [name] to HTN clinic. PMH is significant for sleep apnea (wears CPAP), mitral valve prolapse, hypertension, and hyperlipidemia. Patient has been seen at urgent care and ER a few times for high blood pressure. She saw Dr. [name] in April. Her blood pressure was 130/80 and she was pleasantly surprised. Very hesitant to start medications and prefers lifestyle modifications. She was given BP cuff and asked to monitor at home. She was referred to HTN clinic for follow up.  At last visit ,she had just received her blood pressure cuff , therefore there were no home blood pressure readings to evaluate. She has had some knee and toe pain, for which she was receiving physical therapy. She had been taking Aleve and Tylenol for pain but was instructed to stop Aleve as it can increase blood pressure. Does not do aerobic exercise due to knee. Does mow her lawn and walk at work. Patient has never been on blood pressure medications before. 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 Plan at last visit was to start lifestyle modifications. She previously reported eating a decent amount of fast food, soda, and has a sweet tooth. On the road a lot and finds it easy to stop for fast food. Blood pressure at last visit on first check was 158/92. On repeat 140/88.   Patient presents today to HTN clinic for blood pressure management. She reports that she has decreased sugar intake. She is using 1 teaspoon of sugar instead of table spoon in her coffee. She also reports reducing coffee intake from 3 large cups to 1 cup. She is avoiding sugary cereals and eating oatmeal cereals with almond milk. Also eating fruits including strawberries and blueberries. Eating more salmon, brocolli, asparagus, red meat, grilled chicken, and reduced fast food intake.  Patient works as a Geophysicist/field seismologist for [employer] and has a sedentary lifestyle. Most exercise is from walking across parking lot and walking in big box stores. Pain from knee and leg procludes extensive workouts. She still expresses hesitancy with starting medications, including blood pressure medications and statins. She has stopped taking Aleve for knee pain as instructed at previous visit. Pain is well controlled on Tylenol.   Family History: The patient's family history is not on file. She was adopted.  Social History: never smoked, + ETOH  Diet: Reduced coffee from 3 large cups to 1 cup a day Doesn't cook with salt Rarely salts things No frozen meals, no deli meats, no canned food Now eating more healthy with fruits, veggies, lean meats, and reduced fried/sugary meals  Exercise: Minimal, only exercise is from walking to car in parking lot and roaming in big box stores.  Home BP readings: Patient's home BP readings from last week include:128/72, 145/87, 151/81, 148/91, 141/82, 132/77, 159/83, 157/97, 168/102 Average home BP 149/102  Current HTN meds: none Previously tried: none BP goal: <130/80   Wt Readings from Last 3 Encounters:  08/18/19 171 lb (77.6 kg)  08/13/19 174 lb (78.9 kg)  06/02/19 172 lb 6.4 oz (78.2 kg)   BP Readings from Last 3 Encounters:  10/05/19 (!) 140/92  08/18/19 130/80  08/13/19 (!) 158/91   Pulse Readings from Last 3 Encounters:  10/05/19 78  09/07/19 72  08/18/19 90    Renal function: CrCl cannot be calculated (Patient's most recent lab result is older than the maximum 21 days allowed.).  Past Medical History:  Diagnosis Date  . Mitral valve prolapse 1993  . OSA on CPAP     Current Outpatient Medications on File Prior to Visit  Medication Sig Dispense Refill  . dorzolamide-timolol (COSOPT) 22.3-6.8 MG/ML ophthalmic solution Place 1 drop into the right eye 2 (two) times daily.    Marland Kitchen ELDERBERRY PO Take 2 tablets by mouth daily.    .  melatonin 3 MG TABS tablet Take 6 mg by mouth at bedtime.    . vitamin B-12 (CYANOCOBALAMIN) 1000 MCG tablet Take 1,000 mcg by mouth daily.    Marland Kitchen VITAMIN D PO Take 20,000 Units by mouth daily.     Marland Kitchen zinc gluconate 50 MG tablet Take 50 mg by mouth daily as needed.     Marland Kitchen acyclovir (ZOVIRAX) 400 MG tablet Take 400 mg by mouth every 8 (eight) hours. (Patient not taking: Reported on 10/05/2019)     No current facility-administered medications on file prior to visit.    Allergies  Allergen Reactions  . Penicillins Itching  . Trimox [Amoxicillin Trihydrate] Itching    Blood pressure (!) 140/92, pulse 78, SpO2 98 %.   Assessment/Plan:  1. Hypertension - Blood pressure is 140/92, which is above goal of <130/80 in clinic today. Patient's average home blood pressure reading of 149/102 also demonstrate that her BP is elevated. We extensively discussed the harms of elevated blood pressure including vision damage, kidney damage, and increased risk of cardiovascular disease. Patient wishes to intensify lifestyle modifications prior to starting blood pressure medications. We discussed that if lifestyle modifications do not achieve goal control at next visit, we should consider starting a blood pressure medication (patient agrees). Continued to encourage patient to stop eating fast food and continue with healthy diet as she reported above. We discussed ways to increase her physical activity in an effort to reduce blood pressure. She agrees to starting swimming, and joining [employer]. We reviewed the recommendation to try to attain 150 minutes of exercise/week.  I have reviewed proper blood pressure checking technique with patient and have asked her to check her blood pressure once a day. She will bring in her BP readings (and blood pressure cuff) at next follow up visit in 1 month. In the interim, she would like to do more research herself of blood pressure. I will send her resources to read on benefits of  blood pressure control.  2.) HLD - A lipid panel has been ordered for today. Patient now a bit more open to idea of starting Crestor after learning that Red Yeast Rice has statin like properties and she tolerated that well. Carotid ultrasound from 08/28/19 shoed no evidence of thrombus and minimal plaque. Depending on what her lipid panel shows, she may want to start a statin but wants to do her research and think about it first. I will send her resources to read regarding benefits of statins in HLD.   Thank you  Sherren Kerns, PharmD PGY1 Acute Care Pharmacy Resident

## 2019-10-05 ENCOUNTER — Ambulatory Visit: Payer: Medicare Other

## 2019-10-05 ENCOUNTER — Other Ambulatory Visit: Payer: Medicare Other | Admitting: *Deleted

## 2019-10-05 ENCOUNTER — Ambulatory Visit (INDEPENDENT_AMBULATORY_CARE_PROVIDER_SITE_OTHER): Payer: Medicare Other | Admitting: Pharmacist

## 2019-10-05 ENCOUNTER — Other Ambulatory Visit: Payer: Self-pay

## 2019-10-05 VITALS — BP 140/92 | HR 78

## 2019-10-05 DIAGNOSIS — E785 Hyperlipidemia, unspecified: Secondary | ICD-10-CM | POA: Diagnosis not present

## 2019-10-05 DIAGNOSIS — I10 Essential (primary) hypertension: Secondary | ICD-10-CM | POA: Diagnosis not present

## 2019-10-05 LAB — COMPREHENSIVE METABOLIC PANEL
ALT: 24 IU/L (ref 0–32)
AST: 26 IU/L (ref 0–40)
Albumin/Globulin Ratio: 1.8 (ref 1.2–2.2)
Albumin: 4.5 g/dL (ref 3.8–4.8)
Alkaline Phosphatase: 97 IU/L (ref 48–121)
BUN/Creatinine Ratio: 16 (ref 12–28)
BUN: 15 mg/dL (ref 8–27)
Bilirubin Total: 0.3 mg/dL (ref 0.0–1.2)
CO2: 24 mmol/L (ref 20–29)
Calcium: 9.5 mg/dL (ref 8.7–10.3)
Chloride: 102 mmol/L (ref 96–106)
Creatinine, Ser: 0.95 mg/dL (ref 0.57–1.00)
GFR calc Af Amer: 71 mL/min/{1.73_m2} (ref >59)
GFR calc non Af Amer: 62 mL/min/{1.73_m2} (ref >59)
Globulin, Total: 2.5 g/dL (ref 1.5–4.5)
Glucose: 96 mg/dL (ref 65–99)
Potassium: 4.5 mmol/L (ref 3.5–5.2)
Sodium: 140 mmol/L (ref 134–144)
Total Protein: 7 g/dL (ref 6.0–8.5)

## 2019-10-05 LAB — LIPID PANEL
Chol/HDL Ratio: 9.7 ratio — ABNORMAL HIGH (ref 0.0–4.4)
Cholesterol, Total: 319 mg/dL — ABNORMAL HIGH (ref 100–199)
HDL: 33 mg/dL — ABNORMAL LOW (ref >39)
LDL Chol Calc (NIH): 193 mg/dL — ABNORMAL HIGH (ref 0–99)
Triglycerides: 449 mg/dL — ABNORMAL HIGH (ref 0–149)
VLDL Cholesterol Cal: 93 mg/dL — ABNORMAL HIGH (ref 5–40)

## 2019-10-05 NOTE — Patient Instructions (Addendum)
It was great to see you today!  BP in clinic today was 140/92, which remains above goal of 130/80. It is recommended that you increase the amount of exercise that you can tolerate with swimming, hiking, walking, etc. We will obtain a follow-up visit in 1 month on July 16th, 2021.  Thank you!

## 2019-10-06 ENCOUNTER — Telehealth: Payer: Self-pay | Admitting: Pharmacist

## 2019-10-06 DIAGNOSIS — E785 Hyperlipidemia, unspecified: Secondary | ICD-10-CM

## 2019-10-06 MED ORDER — FENOFIBRATE 145 MG PO TABS: 145.0000 mg | ORAL_TABLET | Freq: Every day | ORAL | 11 refills | Status: DC

## 2019-10-06 MED ORDER — ROSUVASTATIN CALCIUM 10 MG PO TABS: 10.0000 mg | ORAL_TABLET | Freq: Every day | ORAL | 11 refills | Status: DC

## 2019-10-06 NOTE — Telephone Encounter (Signed)
LDL >190. LDL is actually higher than 193. Falsely low due to elevated TG. She has a hx of elevated TG. Diagnosed in 1993. Was seen by Endocrinology in 2019. She was previously on atorvastatin and has leg cramps. Refused to go back on a statin. Was prescribed on fenofibrate but never started. I spoke with patient about her labs. She is agreeable to starting both a statin and fenofibrate. Option was given to start statin and then recheck labs (although I presume TG would still be high) but patient was agreeable to starting rosuvastatin and fenofibrate. A good RX coupon was sent to her for both incase it was less than with her insurance.  Patient did not want to start rosuvastatin 20mg , but was agreeable to starting 10mg  daily and fenofibrate 145mg  daily. Will recheck labs in 1 month at next pharmacy appointment.

## 2019-10-09 DIAGNOSIS — H2513 Age-related nuclear cataract, bilateral: Secondary | ICD-10-CM | POA: Diagnosis not present

## 2019-10-09 DIAGNOSIS — H33311 Horseshoe tear of retina without detachment, right eye: Secondary | ICD-10-CM | POA: Diagnosis not present

## 2019-11-06 ENCOUNTER — Encounter: Payer: Self-pay | Admitting: Pharmacist

## 2019-11-06 ENCOUNTER — Ambulatory Visit: Payer: Medicare Other

## 2019-11-06 ENCOUNTER — Ambulatory Visit (INDEPENDENT_AMBULATORY_CARE_PROVIDER_SITE_OTHER): Payer: Medicare Other | Admitting: Pharmacist

## 2019-11-06 ENCOUNTER — Other Ambulatory Visit: Payer: Self-pay

## 2019-11-06 ENCOUNTER — Other Ambulatory Visit: Payer: Medicare Other | Admitting: *Deleted

## 2019-11-06 VITALS — BP 148/88 | HR 83

## 2019-11-06 DIAGNOSIS — E785 Hyperlipidemia, unspecified: Secondary | ICD-10-CM

## 2019-11-06 DIAGNOSIS — I10 Essential (primary) hypertension: Secondary | ICD-10-CM | POA: Diagnosis not present

## 2019-11-06 LAB — HEPATIC FUNCTION PANEL
ALT: 50 IU/L — ABNORMAL HIGH (ref 0–32)
AST: 40 IU/L (ref 0–40)
Albumin: 4.4 g/dL (ref 3.8–4.8)
Alkaline Phosphatase: 90 IU/L (ref 48–121)
Bilirubin Total: 0.3 mg/dL (ref 0.0–1.2)
Bilirubin, Direct: 0.1 mg/dL (ref 0.00–0.40)
Total Protein: 7.2 g/dL (ref 6.0–8.5)

## 2019-11-06 LAB — LDL CHOLESTEROL, DIRECT: LDL Direct: 78 mg/dL (ref 0–99)

## 2019-11-06 LAB — LIPID PANEL
Chol/HDL Ratio: 5.2 ratio — ABNORMAL HIGH (ref 0.0–4.4)
Cholesterol, Total: 191 mg/dL (ref 100–199)
HDL: 37 mg/dL — ABNORMAL LOW (ref >39)
LDL Chol Calc (NIH): 91 mg/dL (ref 0–99)
Triglycerides: 383 mg/dL — ABNORMAL HIGH (ref 0–149)
VLDL Cholesterol Cal: 63 mg/dL — ABNORMAL HIGH (ref 5–40)

## 2019-11-06 MED ORDER — VALSARTAN 40 MG PO TABS: 40.0000 mg | ORAL_TABLET | Freq: Every day | ORAL | 1 refills | Status: DC

## 2019-11-06 NOTE — Progress Notes (Signed)
Patient ID: [name]                 DOB: March 09, 1951                      MRN: [id number]     HPI: Brittney Williamson is a 69 y.o. female referred by Dr. [name] to HTN clinic. PMH is significant for OSA, mitral valve prolapse, HTN, HLD.   This is her third visit to the HTN clinic. She has been hesitant to start blood pressure medications and has been attempting to control her hypertension with lifestyle changes.  At last visit, average home BP was 149/102.    Did not bring blood pressure log or machine with her today but reports it is variable.  Has occasionally had systolic BP readings in 628Z or 200s.  Other times in 120s.  Has a CPAP but reports she has not worn it 2 times this week and is very tired also due to a recent trip to [place].  At recent visits has been reporting that she wants to stay off BP medications and work on diet and exercise.  Also endorses leg pain which she thinks may be a combination of the rosuvastatin/fenofibrate and walking up and down stairs in [place].  Says she does not want to be on BP medication because she feels she will never be able to get off of it.  Current HTN meds: N/A Previously tried: N/A BP goal: <130/80  Social History: No tobacco, has not been drinking recently  Wt Readings from Last 3 Encounters:  08/18/19 171 lb (77.6 kg)  08/13/19 174 lb (78.9 kg)  06/02/19 172 lb 6.4 oz (78.2 kg)   BP Readings from Last 3 Encounters:  10/05/19 (!) 140/92  08/18/19 130/80  08/13/19 (!) 158/91   Pulse Readings from Last 3 Encounters:  10/05/19 78  09/07/19 72  08/18/19 90    Renal function: CrCl cannot be calculated (Patient's most recent lab result is older than the maximum 21 days allowed.).  Past Medical History:  Diagnosis Date  . Mitral valve prolapse 1993  . OSA on CPAP     Current Outpatient Medications on File Prior to Visit  Medication Sig Dispense Refill  . acetaminophen (TYLENOL 8 HOUR) 650 MG CR tablet Take 650 mg by  mouth every 8 (eight) hours as needed for pain.    Marland Kitchen acyclovir (ZOVIRAX) 400 MG tablet Take 400 mg by mouth every 8 (eight) hours. (Patient not taking: Reported on 10/05/2019)    . dorzolamide-timolol (COSOPT) 22.3-6.8 MG/ML ophthalmic solution Place 1 drop into the right eye 2 (two) times daily.    Marland Kitchen ELDERBERRY PO Take 2 tablets by mouth daily.    . fenofibrate (TRICOR) 145 MG tablet Take 1 tablet (145 mg total) by mouth daily. 30 tablet 11  . melatonin 3 MG TABS tablet Take 6 mg by mouth at bedtime.    . rosuvastatin (CRESTOR) 10 MG tablet Take 1 tablet (10 mg total) by mouth daily. 30 tablet 11  . vitamin B-12 (CYANOCOBALAMIN) 1000 MCG tablet Take 1,000 mcg by mouth daily.    Marland Kitchen VITAMIN D PO Take 20,000 Units by mouth daily.     Marland Kitchen zinc gluconate 50 MG tablet Take 50 mg by mouth daily as needed.      No current facility-administered medications on file prior to visit.    Allergies  Allergen Reactions  . Penicillins Itching  . Trimox [Amoxicillin  Trihydrate] Itching     Assessment/Plan:  1. Hypertension -   BP in room today 148/88 which is above goal of <130/80.  Patient very resistant to starting any type of blood pressure medication.  Educated patient on risks of uncontrolled blood pressure (as well as elevated LDL) and possible complications such as heart attacks, strokes, and kidney dysfunction.  Considered thiazide diuretic, however patient works for [employer] car, is often driving, and is concerned about frequent need for urination.  Then discussed ARBs and benefits of that drug class.  Patient was agreeable to low dose.  ~30 minutes was spent going over risk/benefit of starting HTN medications.  Has appointment with Dr. [name] in 2 weeks.  Will check BMP at that time.  Karren Cobble, PharmD, BCACP, [place] [address] [place], [place] [address] Phone: [phone]; Fax: [phone] 11/06/2019 9:34 AM

## 2019-11-06 NOTE — Patient Instructions (Signed)
It was nice meeting you today!  Your blood pressure goal is <130/80  We are going to start you today on a blood pressure medication called Valsartan.  You will take this medication once a day.  Try to take it at the same time every day.  Continue watching your diet and exercising at least 30 minutes a day at least 5 days a week  Please call us with any questions  Karren Cobble, PharmD, [place], [place] [address], [place], [place] [address] Phone: [phone]; Fax: [phone] 11/06/2019 9:08 AM

## 2019-11-10 ENCOUNTER — Telehealth: Payer: Self-pay | Admitting: Pharmacist

## 2019-11-10 DIAGNOSIS — E785 Hyperlipidemia, unspecified: Secondary | ICD-10-CM

## 2019-11-10 NOTE — Telephone Encounter (Signed)
LDL is much better on rosuvastatin 10mg . TG improved, but still high. Will need to confirm patient is taking fenofibrate. Patient is very resistant to mediations. If she is not taking fenofibrate, will encourage her to start. If she is, then continue current medication regimen. Focus on diet low is carbs/sugars. Left message for patient to return call.

## 2019-11-11 NOTE — Telephone Encounter (Addendum)
Message was left by Brittney Williamson to discuss lipids. LDL had improved from 193 to 78 (direct LDL) on rosuvastatin 10mg  daily. TG decreased from 449 to 383 on fenofibrate 145mg  daily. Denies missed med doses and alcohol use but states she was not fasting for lipid panel and had eaten breakfast (eggs and toast) before labs were drawn. States carbs and sugar intake are a bit higher. She drinks 1 regular ginger ale 1 day a week, drinks 1 juice every other day and will work to cut back on these. Will recheck fasting lipid panel next Tuesday before Wednesday visit with Dr [name]. She is aware to continue on fibrate and statin.  Pt also sent MyChart message yesterday about her BP and reported BP readings have all at goal <130/52mmHg except 1 elevated reading this morning of 154/82. She states she has not started the valsartan that was prescribed at 7/16 PharmD visit. Canceled BMET for next week and advised pt to continue keeping log of BP readings and to bring these to visit next week with Dr [name]. Can consider starting valsartan if BP are consistently > 130/9mmHg at that time.

## 2019-11-11 NOTE — Addendum Note (Signed)
Addended by: Britny Riel E on: 11/11/2019 10:04 AM   Modules accepted: Orders

## 2019-11-11 NOTE — Telephone Encounter (Signed)
Patient is returning call to discuss results. 

## 2019-11-17 ENCOUNTER — Other Ambulatory Visit: Payer: Medicare Other

## 2019-11-17 ENCOUNTER — Other Ambulatory Visit: Payer: Self-pay

## 2019-11-17 DIAGNOSIS — E785 Hyperlipidemia, unspecified: Secondary | ICD-10-CM | POA: Diagnosis not present

## 2019-11-17 LAB — LIPID PANEL
Chol/HDL Ratio: 3.5 ratio (ref 0.0–4.4)
Cholesterol, Total: 132 mg/dL (ref 100–199)
HDL: 38 mg/dL — ABNORMAL LOW (ref >39)
LDL Chol Calc (NIH): 64 mg/dL (ref 0–99)
Triglycerides: 179 mg/dL — ABNORMAL HIGH (ref 0–149)
VLDL Cholesterol Cal: 30 mg/dL (ref 5–40)

## 2019-11-18 ENCOUNTER — Ambulatory Visit (INDEPENDENT_AMBULATORY_CARE_PROVIDER_SITE_OTHER): Payer: Medicare Other | Admitting: Cardiology

## 2019-11-18 ENCOUNTER — Encounter: Payer: Self-pay | Admitting: Cardiology

## 2019-11-18 ENCOUNTER — Other Ambulatory Visit: Payer: Medicare Other

## 2019-11-18 VITALS — BP 120/70 | HR 82 | Ht 65.0 in | Wt 172.0 lb

## 2019-11-18 DIAGNOSIS — E785 Hyperlipidemia, unspecified: Secondary | ICD-10-CM | POA: Diagnosis not present

## 2019-11-18 DIAGNOSIS — I10 Essential (primary) hypertension: Secondary | ICD-10-CM | POA: Diagnosis not present

## 2019-11-18 DIAGNOSIS — G453 Amaurosis fugax: Secondary | ICD-10-CM

## 2019-11-18 NOTE — Patient Instructions (Signed)

## 2019-11-18 NOTE — Progress Notes (Signed)
Cardiology Office Note:    Date:  11/18/2019   ID:  PARIS HOHN, DOB Jul 07, 1950, MRN [id number]  PCP:  Rutherford Guys, MD  [facility] Cardiologist:  Candee Furbish, MD  [facility] Electrophysiologist:  None   Referring MD: Rutherford Guys, MD    History of Present Illness:    Brittney Williamson is a 69 y.o. female here for lipid follow-up, hypertension follow-up.  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  Used to work at [employer], [place].  Prior transient amaurosis fugax right eye  Recent long drive to [place] and back for 50th high school reunion.  Overall doing very well. No fevers chills nausea vomiting syncope bleeding.  Past Medical History:  Diagnosis Date  . Mitral valve prolapse 1993  . OSA on CPAP     Past Surgical History:  Procedure Laterality Date  . KNEE ARTHROSCOPY WITH MEDIAL MENISECTOMY Left 05/27/2019  . WRIST SURGERY Right 2017    Current Medications: Current Meds  Medication Sig  . acetaminophen (TYLENOL 8 HOUR) 650 MG CR tablet Take 650 mg by mouth every 8 (eight) hours as needed for pain.  Marland Kitchen acyclovir (ZOVIRAX) 400 MG tablet Take 400 mg by mouth as needed (every 8 hours as needed).   . dorzolamide-timolol (COSOPT) 22.3-6.8 MG/ML ophthalmic solution Place 1 drop into the right eye 2 (two) times daily.  Marland Kitchen ELDERBERRY PO Take 2 tablets by mouth daily.  . fenofibrate (TRICOR) 145 MG tablet Take 1 tablet (145 mg total) by mouth daily.  . melatonin 3 MG TABS tablet Take 6 mg by mouth at bedtime.  . rosuvastatin (CRESTOR) 10 MG tablet Take 1 tablet (10 mg total) by mouth daily.  . vitamin B-12 (CYANOCOBALAMIN) 1000 MCG tablet Take 1,000 mcg by mouth daily.  Marland Kitchen VITAMIN D PO Take 20,000 Units by mouth daily.   Marland Kitchen zinc gluconate 50 MG tablet Take 50 mg by mouth daily as needed.      Allergies:   Penicillins and Trimox [amoxicillin trihydrate]   Social History   Socioeconomic History  . Marital status:  Single    Spouse name: Not on file  . Number of children: Not on file  . Years of education: Not on file  . Highest education level: Not on file  Occupational History  . Not on file  Tobacco Use  . Smoking status: Never Smoker  . Smokeless tobacco: Never Used  Substance and Sexual Activity  . Alcohol use: Yes    Comment: occ  . Drug use: Never  . Sexual activity: Not Currently  Other Topics Concern  . Not on file  Social History Narrative  . Not on file   Social Determinants of Health   Financial Resource Strain:   . Difficulty of Paying Living Expenses:   Food Insecurity:   . Worried About Charity fundraiser in the Last Year:   . Arboriculturist in the Last Year:   Transportation Needs:   . Film/video editor (Medical):   Marland Kitchen Lack of Transportation (Non-Medical):   Physical Activity:   . Days of Exercise per Week:   . Minutes of Exercise per Session:   Stress:   . Feeling of Stress :   Social Connections:   . Frequency of Communication with Friends and Family:   . Frequency of Social Gatherings with Friends and Family:   . Attends Religious Services:   . Active Member of Clubs or Organizations:   .  Attends Archivist Meetings:   Marland Kitchen Marital Status:      Family History: The patient's family history is not on file. She was adopted.  ROS:   Please see the history of present illness.     All other systems reviewed and are negative.  EKGs/Labs/Other Studies Reviewed:    The following studies were reviewed today:  1. Normal LV systolic function; grade 1 diastolic dysfunction; mild LVH;  trace MR and TR.  2. Left ventricular ejection fraction, by estimation, is 60 to 65%. The  left ventricle has normal function. The left ventricle has no regional  wall motion abnormalities. There is mild left ventricular hypertrophy.  Left ventricular diastolic parameters  are consistent with Grade I diastolic dysfunction (impaired relaxation).  3. Right  ventricular systolic function is normal. The right ventricular  size is normal. Tricuspid regurgitation signal is inadequate for assessing  PA pressure.  4. The mitral valve is normal in structure. Trivial mitral valve  regurgitation. No evidence of mitral stenosis.  5. The aortic valve is tricuspid. Aortic valve regurgitation is not  visualized. Mild aortic valve sclerosis is present, with no evidence of  aortic valve stenosis.  6. The inferior vena cava is normal in size with greater than 50%  respiratory variability, suggesting right atrial pressure of 3 mmHg.     Recent Labs: 10/05/2019: BUN 15; Creatinine, Ser 0.95; Potassium 4.5; Sodium 140 11/06/2019: ALT 50  Recent Lipid Panel    Component Value Date/Time   CHOL 132 11/17/2019 0756   TRIG 179 (H) 11/17/2019 0756   HDL 38 (L) 11/17/2019 0756   CHOLHDL 3.5 11/17/2019 0756   CHOLHDL 6 04/14/2018 0919   VLDL 67.8 (H) 04/14/2018 0919   LDLCALC 64 11/17/2019 0756   LDLDIRECT 78 11/06/2019 0832   LDLDIRECT 57.0 04/14/2018 0919    Physical Exam:    VS:  BP 120/70   Pulse 82   Ht 5\' 5"  (1.651 m)   Wt 172 lb (78 kg)   SpO2 96%   BMI 28.62 kg/m     Wt Readings from Last 3 Encounters:  11/18/19 172 lb (78 kg)  08/18/19 171 lb (77.6 kg)  08/13/19 174 lb (78.9 kg)     GEN:  Well nourished, well developed in no acute distress HEENT: Normal NECK: No JVD; No carotid bruits LYMPHATICS: No lymphadenopathy CARDIAC: RRR, no murmurs, rubs, gallops RESPIRATORY:  Clear to auscultation without rales, wheezing or rhonchi  ABDOMEN: Soft, non-tender, non-distended MUSCULOSKELETAL:  No edema; No deformity  SKIN: Warm and dry NEUROLOGIC:  Alert and oriented x 3 PSYCHIATRIC:  Normal affect   ASSESSMENT:    1. Hyperlipidemia, unspecified hyperlipidemia type   2. Essential hypertension   3. Amaurosis fugax    PLAN:    In order of problems listed above:  Essential hypertension -Continuing home blood pressure. Excellent  blood pressure currently. It was likely high from anxiety previously. She is not on any medications currently for blood pressure. Today it is 120/70.  Amaurosis fugax -Carotid Dopplers and echocardiogram overall reassuring.  Mixed hyperlipidemia -LDL went from 193 down to 64 with Crestor  -Triglycerides from 450 down to 179.  Excellent.  TriCor. -Mild right calf cramping at times with walking. Reassurance. No evidence of thrombosis. Encourage stretching.  Mild right carotid artery plaque -Cholesterol-lowering.  No evidence of mitral valve prolapse  Medication Adjustments/Labs and Tests Ordered: Current medicines are reviewed at length with the patient today.  Concerns regarding medicines are outlined above.  No orders of the defined types were placed in this encounter.  No orders of the defined types were placed in this encounter.   Patient Instructions  Medication Instructions:  The current medical regimen is effective;  continue present plan and medications.  *If you need a refill on your cardiac medications before your next appointment, please call your pharmacy*  Follow-Up: At [facility], you and your health needs are our priority.  As part of our continuing mission to provide you with exceptional heart care, we have created designated Provider Care Teams.  These Care Teams include your primary Cardiologist (physician) and Advanced Practice Providers (APPs -  Physician Assistants and Nurse Practitioners) who all work together to provide you with the care you need, when you need it.  We recommend signing up for the patient portal called "MyChart".  Sign up information is provided on this After Visit Summary.  MyChart is used to connect with patients for Virtual Visits (Telemedicine).  Patients are able to view lab/test results, encounter notes, upcoming appointments, etc.  Non-urgent messages can be sent to your provider as well.   To learn more about what you can do with  MyChart, go to [email or web address].    Your next appointment:   6 month(s)  The format for your next appointment:   In Person  Provider:   Candee Furbish, MD   Thank you for choosing [facility]!!        Signed, Candee Furbish, MD  11/18/2019 2:00 PM    [place]

## 2019-12-02 DIAGNOSIS — H2513 Age-related nuclear cataract, bilateral: Secondary | ICD-10-CM | POA: Diagnosis not present

## 2019-12-02 DIAGNOSIS — H33311 Horseshoe tear of retina without detachment, right eye: Secondary | ICD-10-CM | POA: Diagnosis not present

## 2020-01-15 ENCOUNTER — Telehealth: Payer: Self-pay | Admitting: *Deleted

## 2020-01-15 NOTE — Telephone Encounter (Signed)
Schedule mammogram Mobile Unit 01-18-2020 primary care at [facility]

## 2020-02-09 DIAGNOSIS — H2513 Age-related nuclear cataract, bilateral: Secondary | ICD-10-CM | POA: Diagnosis not present

## 2020-02-09 DIAGNOSIS — H33311 Horseshoe tear of retina without detachment, right eye: Secondary | ICD-10-CM | POA: Diagnosis not present

## 2020-02-12 ENCOUNTER — Other Ambulatory Visit: Payer: Self-pay

## 2020-02-12 ENCOUNTER — Encounter (HOSPITAL_COMMUNITY): Payer: Self-pay | Admitting: Urgent Care

## 2020-02-12 ENCOUNTER — Ambulatory Visit (HOSPITAL_COMMUNITY)
Admission: EM | Admit: 2020-02-12 | Discharge: 2020-02-12 | Disposition: A | Discharge status: Home / Self Care | Payer: Medicare Other | Attending: Urgent Care | Admitting: Urgent Care

## 2020-02-12 DIAGNOSIS — R21 Rash and other nonspecific skin eruption: Secondary | ICD-10-CM

## 2020-02-12 DIAGNOSIS — L237 Allergic contact dermatitis due to plants, except food: Secondary | ICD-10-CM

## 2020-02-12 MED ORDER — PREDNISONE 20 MG PO TABS: ORAL_TABLET | ORAL | 0 refills | Status: DC

## 2020-02-12 NOTE — ED Triage Notes (Signed)
Pt presents today with rash on face, neck and arms that started Tuesday night. States she thinks she could have gotten it from working in the yard. No pain but does have itching. Has tried OTC [facility] topical ointment and baking soda and water. It did help some.

## 2020-02-12 NOTE — ED Provider Notes (Signed)
[place]   MRN: [id number] DOB: 1950-08-24  Subjective:   Brittney Williamson is a 69 y.o. female presenting for 3-day history of acute onset rash that has spread onto her face around her eye, neck and arms.  Patient states that she has had contact with poison ivy and has significant reactions to it.  Has been using Benadryl on multiple topical therapies with minimal relief.  Patient is requesting a steroid course that she does very well with this when she has a poison ivy reaction.  Denies oral swelling, throat closing sensation, chest pain, belly pain, difficulty breathing.  No current facility-administered medications for this encounter.  Current Outpatient Medications:  .  acetaminophen (TYLENOL 8 HOUR) 650 MG CR tablet, Take 650 mg by mouth every 8 (eight) hours as needed for pain., Disp: , Rfl:  .  acyclovir (ZOVIRAX) 400 MG tablet, Take 400 mg by mouth as needed (every 8 hours as needed). , Disp: , Rfl:  .  dorzolamide-timolol (COSOPT) 22.3-6.8 MG/ML ophthalmic solution, Place 1 drop into the right eye 2 (two) times daily., Disp: , Rfl:  .  ELDERBERRY PO, Take 2 tablets by mouth daily., Disp: , Rfl:  .  fenofibrate (TRICOR) 145 MG tablet, Take 1 tablet (145 mg total) by mouth daily., Disp: 30 tablet, Rfl: 11 .  melatonin 3 MG TABS tablet, Take 6 mg by mouth at bedtime., Disp: , Rfl:  .  rosuvastatin (CRESTOR) 10 MG tablet, Take 1 tablet (10 mg total) by mouth daily., Disp: 30 tablet, Rfl: 11 .  vitamin B-12 (CYANOCOBALAMIN) 1000 MCG tablet, Take 1,000 mcg by mouth daily., Disp: , Rfl:  .  VITAMIN D PO, Take 20,000 Units by mouth daily. , Disp: , Rfl:  .  zinc gluconate 50 MG tablet, Take 50 mg by mouth daily as needed. , Disp: , Rfl:    Allergies  Allergen Reactions  . Penicillins Itching  . Trimox [Amoxicillin Trihydrate] Itching    Past Medical History:  Diagnosis Date  . Mitral valve prolapse 1993  . OSA on CPAP      Past Surgical History:  Procedure  Laterality Date  . KNEE ARTHROSCOPY WITH MEDIAL MENISECTOMY Left 05/27/2019  . WRIST SURGERY Right 2017    Family History  Adopted: Yes    Social History   Tobacco Use  . Smoking status: Never Smoker  . Smokeless tobacco: Never Used  Substance Use Topics  . Alcohol use: Yes    Comment: occ  . Drug use: Never    ROS   Objective:   Vitals: BP (!) 152/86 (BP Location: Left Arm)   Pulse 91   Temp 99.6 F (37.6 C) (Oral)   Resp 16   SpO2 98%   Physical Exam Constitutional:      General: She is not in acute distress.    Appearance: She is well-developed. She is not ill-appearing.  HENT:     Head: Normocephalic and atraumatic.     Right Ear: Tympanic membrane and ear canal normal. No drainage or tenderness. No middle ear effusion. Tympanic membrane is not erythematous.     Left Ear: Tympanic membrane and ear canal normal. No drainage or tenderness.  No middle ear effusion. Tympanic membrane is not erythematous.     Nose: No congestion or rhinorrhea.     Mouth/Throat:     Mouth: Mucous membranes are moist. No oral lesions.     Pharynx: Oropharynx is clear. No pharyngeal swelling, oropharyngeal exudate, posterior  oropharyngeal erythema or uvula swelling.     Tonsils: No tonsillar exudate or tonsillar abscesses.  Eyes:     Extraocular Movements:     Right eye: Normal extraocular motion.     Left eye: Normal extraocular motion.     Conjunctiva/sclera: Conjunctivae normal.     Pupils: Pupils are equal, round, and reactive to light.  Cardiovascular:     Rate and Rhythm: Normal rate.  Pulmonary:     Effort: Pulmonary effort is normal.  Musculoskeletal:     Cervical back: Normal range of motion and neck supple.  Lymphadenopathy:     Cervical: No cervical adenopathy.  Skin:    General: Skin is warm and dry.     Findings: Rash (Erythematous rash with multiple excoriations worse over the face of the left side around the eye, across the forehead but also has the rash over  her left neck, upper chest and arms to a lesser degree) present.  Neurological:     General: No focal deficit present.     Mental Status: She is alert and oriented to person, place, and time.  Psychiatric:        Mood and Affect: Mood normal.        Behavior: Behavior normal.       Assessment and Plan :   PDMP not reviewed this encounter.  1. Poison ivy dermatitis   2. Rash and nonspecific skin eruption     We will use a 15-day steroid course to address contact dermatitis given facial and eye involvement.  Continue Benadryl for itching, patient declined hydroxyzine. Counseled patient on potential for adverse effects with medications prescribed/recommended today, ER and return-to-clinic precautions discussed, patient verbalized understanding.    Jaynee Eagles, [place] 02/12/20 1832

## 2020-02-19 ENCOUNTER — Telehealth: Payer: Self-pay | Admitting: Cardiology

## 2020-02-19 NOTE — Telephone Encounter (Signed)
Patient would like to know if she needs to have labs done before her appointment 05/17/19. Please advise

## 2020-02-22 NOTE — Telephone Encounter (Signed)
Last lab 11/11/2019 - Lipid, 11/06/19 Hepatic - ALT 50, 10/05/19 CMP-NL.  Will have Dr [name] to review if any labs are needed.

## 2020-02-23 NOTE — Telephone Encounter (Signed)
Order ALT, this was minimally elevated at prior check.  Does not need lipid panel since recently done. Candee Furbish, MD

## 2020-02-24 NOTE — Telephone Encounter (Signed)
Spoke with pt who is aware Dr [name] only wants for her to have her ALT re-checked as it was elevated at 50 in July.  Advised Lipid panel is not needed at this time.  Pt states she is curious about her cholesterol levels since she is taking medications.  She asked about when she will be able to come off the medications ans I advised her she will need to continue them long term because if she stops them her levels will increase again.  She states understanding.  Review appt date and time with her.  She reports she is going to come fasting for the appt.  Advised her that is not necessary unless she is planning to discuss the need for lipid profile with Dr [name] during the appt.   She had no further questions at the end of the call.

## 2020-03-02 DIAGNOSIS — R198 Other specified symptoms and signs involving the digestive system and abdomen: Secondary | ICD-10-CM | POA: Diagnosis not present

## 2020-03-02 DIAGNOSIS — R152 Fecal urgency: Secondary | ICD-10-CM | POA: Diagnosis not present

## 2020-05-16 ENCOUNTER — Ambulatory Visit: Payer: Medicare Other | Admitting: Cardiology

## 2020-05-20 ENCOUNTER — Encounter: Payer: Self-pay | Admitting: Cardiology

## 2020-05-20 ENCOUNTER — Other Ambulatory Visit: Payer: Self-pay

## 2020-05-20 ENCOUNTER — Ambulatory Visit (INDEPENDENT_AMBULATORY_CARE_PROVIDER_SITE_OTHER): Payer: Medicare Other | Admitting: Cardiology

## 2020-05-20 VITALS — BP 144/76 | HR 82 | Ht 65.0 in | Wt 180.0 lb

## 2020-05-20 DIAGNOSIS — E785 Hyperlipidemia, unspecified: Secondary | ICD-10-CM

## 2020-05-20 DIAGNOSIS — Z79899 Other long term (current) drug therapy: Secondary | ICD-10-CM | POA: Diagnosis not present

## 2020-05-20 DIAGNOSIS — I10 Essential (primary) hypertension: Secondary | ICD-10-CM | POA: Diagnosis not present

## 2020-05-20 LAB — BASIC METABOLIC PANEL
BUN/Creatinine Ratio: 15 (ref 12–28)
BUN: 14 mg/dL (ref 8–27)
CO2: 27 mmol/L (ref 20–29)
Calcium: 9.4 mg/dL (ref 8.7–10.3)
Chloride: 106 mmol/L (ref 96–106)
Creatinine, Ser: 0.93 mg/dL (ref 0.57–1.00)
GFR calc Af Amer: 73 mL/min/{1.73_m2} (ref >59)
GFR calc non Af Amer: 63 mL/min/{1.73_m2} (ref >59)
Glucose: 115 mg/dL — ABNORMAL HIGH (ref 65–99)
Potassium: 4.3 mmol/L (ref 3.5–5.2)
Sodium: 145 mmol/L — ABNORMAL HIGH (ref 134–144)

## 2020-05-20 NOTE — Progress Notes (Signed)
Cardiology Office Note:    Date:  05/20/2020   ID:  Brittney Williamson, DOB 1950-11-24, MRN [id number]  PCP:  Patient, No Pcp Per  [facility] Cardiologist:  Candee Furbish, MD  [facility] Electrophysiologist:  None   Referring MD: No ref. provider found     History of Present Illness:    Brittney Williamson is a 70 y.o. female here for hypertension and lipid follow-up.  Has worked for [employer] different geographical locations.  Used to work for [employer] at [place].  Prior amaurosis fugax of right eye.  Overall been doing quite well.  Past Medical History:  Diagnosis Date  . Mitral valve prolapse 1993  . OSA on CPAP     Past Surgical History:  Procedure Laterality Date  . KNEE ARTHROSCOPY WITH MEDIAL MENISECTOMY Left 05/27/2019  . WRIST SURGERY Right 2017    Current Medications: Current Meds  Medication Sig  . acyclovir (ZOVIRAX) 400 MG tablet Take 400 mg by mouth as needed (every 8 hours as needed).   Marland Kitchen co-enzyme Q-10 50 MG capsule Take 50 mg by mouth daily.  . dorzolamide-timolol (COSOPT) 22.3-6.8 MG/ML ophthalmic solution Place 1 drop into the right eye 2 (two) times daily.  Marland Kitchen ELDERBERRY PO Take 2 tablets by mouth daily.  . fenofibrate (TRICOR) 145 MG tablet Take 1 tablet (145 mg total) by mouth daily.  . magnesium 30 MG tablet Take 30 mg by mouth 2 (two) times daily.  . melatonin 3 MG TABS tablet Take 6 mg by mouth at bedtime.  . rosuvastatin (CRESTOR) 10 MG tablet Take 1 tablet (10 mg total) by mouth daily.  . vitamin B-12 (CYANOCOBALAMIN) 1000 MCG tablet Take 1,000 mcg by mouth daily.  Marland Kitchen VITAMIN D PO Take 20,000 Units by mouth daily.   Marland Kitchen zinc gluconate 50 MG tablet Take 50 mg by mouth daily as needed.      Allergies:   Penicillins and Trimox [amoxicillin trihydrate]   Social History   Socioeconomic History  . Marital status: Single    Spouse name: Not on file  . Number of children: Not on file  . Years of education: Not on file  . Highest  education level: Not on file  Occupational History  . Not on file  Tobacco Use  . Smoking status: Never Smoker  . Smokeless tobacco: Never Used  Substance and Sexual Activity  . Alcohol use: Yes    Comment: occ  . Drug use: Never  . Sexual activity: Not Currently  Other Topics Concern  . Not on file  Social History Narrative  . Not on file   Social Determinants of Health   Financial Resource Strain: Not on file  Food Insecurity: Not on file  Transportation Needs: Not on file  Physical Activity: Not on file  Stress: Not on file  Social Connections: Not on file     Family History: The patient's family history is not on file. She was adopted.  ROS:   Please see the history of present illness.     All other systems reviewed and are negative.  EKGs/Labs/Other Studies Reviewed:    The following studies were reviewed today:  ECHO 09/07/19  1. Normal LV systolic function; grade 1 diastolic dysfunction; mild LVH;  trace MR and TR.  2. Left ventricular ejection fraction, by estimation, is 60 to 65%. The  left ventricle has normal function. The left ventricle has no regional  wall motion abnormalities. There is mild left ventricular  hypertrophy.  Left ventricular diastolic parameters  are consistent with Grade I diastolic dysfunction (impaired relaxation).  3. Right ventricular systolic function is normal. The right ventricular  size is normal. Tricuspid regurgitation signal is inadequate for assessing  PA pressure.  4. The mitral valve is normal in structure. Trivial mitral valve  regurgitation. No evidence of mitral stenosis.  5. The aortic valve is tricuspid. Aortic valve regurgitation is not  visualized. Mild aortic valve sclerosis is present, with no evidence of  aortic valve stenosis.  6. The inferior vena cava is normal in size with greater than 50%  respiratory variability, suggesting right atrial pressure of 3 mmHg.    Recent Labs: 10/05/2019: BUN 15;  Creatinine, Ser 0.95; Potassium 4.5; Sodium 140 11/06/2019: ALT 50  Recent Lipid Panel    Component Value Date/Time   CHOL 132 11/17/2019 0756   TRIG 179 (H) 11/17/2019 0756   HDL 38 (L) 11/17/2019 0756   CHOLHDL 3.5 11/17/2019 0756   CHOLHDL 6 04/14/2018 0919   VLDL 67.8 (H) 04/14/2018 0919   LDLCALC 64 11/17/2019 0756   LDLDIRECT 78 11/06/2019 0832   LDLDIRECT 57.0 04/14/2018 0919     Risk Assessment/Calculations:      Physical Exam:    VS:  BP (!) 144/76   Pulse 82   Ht 5\' 5"  (1.651 m)   Wt 180 lb (81.6 kg)   SpO2 96%   BMI 29.95 kg/m     Wt Readings from Last 3 Encounters:  05/20/20 180 lb (81.6 kg)  11/18/19 172 lb (78 kg)  08/18/19 171 lb (77.6 kg)     GEN:  Well nourished, well developed in no acute distress HEENT: Normal NECK: No JVD; No carotid bruits LYMPHATICS: No lymphadenopathy CARDIAC: RRR, no murmurs, rubs, gallops RESPIRATORY:  Clear to auscultation without rales, wheezing or rhonchi  ABDOMEN: Soft, non-tender, non-distended MUSCULOSKELETAL:  No edema; No deformity  SKIN: Warm and dry NEUROLOGIC:  Alert and oriented x 3 PSYCHIATRIC:  Normal affect   ASSESSMENT:    1. Essential hypertension   2. Medication management   3. Hyperlipidemia, unspecified hyperlipidemia type    PLAN:    In order of problems listed above:   Essential hypertension -Continuing home blood pressure. Excellent blood pressure currently. It was likely high from anxiety previously. She is not on any medications currently for blood pressure. Slightly elevated today but usually at home under good range. No changes made.  Amaurosis fugax -Carotid Dopplers and echocardiogram overall reassuring. Mild right carotid artery plaque.  Mixed hyperlipidemia -LDL went from 193 down to 64 with Crestor  -Triglycerides from 450 down to 179.  Excellent.  TriCor. -Mild right calf cramping at times with walking. Reassurance. No evidence of thrombosis. Encourage stretching. She does  use some Voltaren gel at times. She is also taking coenzyme every 10 as well. Happy she is taking the Crestor.  Mild right carotid artery plaque -Cholesterol-lowering. Discussed the importance. Consider aspirin 81 mg a day. We discussed.  No evidence of mitral valve prolapse        Medication Adjustments/Labs and Tests Ordered: Current medicines are reviewed at length with the patient today.  Concerns regarding medicines are outlined above.  Orders Placed This Encounter  Procedures  . Basic metabolic panel   No orders of the defined types were placed in this encounter.   Patient Instructions  Medication Instructions:  The current medical regimen is effective;  continue present plan and medications.  *If you need a refill on  your cardiac medications before your next appointment, please call your pharmacy*  Lab Work: Please have blood work today (BMP)  If you have labs (blood work) drawn today and your tests are completely normal, you will receive your results only by: Marland Kitchen MyChart Message (if you have MyChart) OR . A paper copy in the mail If you have any lab test that is abnormal or we need to change your treatment, we will call you to review the results.  Follow-Up: At [facility], you and your health needs are our priority.  As part of our continuing mission to provide you with exceptional heart care, we have created designated Provider Care Teams.  These Care Teams include your primary Cardiologist (physician) and Advanced Practice Providers (APPs -  Physician Assistants and Nurse Practitioners) who all work together to provide you with the care you need, when you need it.  We recommend signing up for the patient portal called "MyChart".  Sign up information is provided on this After Visit Summary.  MyChart is used to connect with patients for Virtual Visits (Telemedicine).  Patients are able to view lab/test results, encounter notes, upcoming appointments, etc.  Non-urgent  messages can be sent to your provider as well.   To learn more about what you can do with MyChart, go to [email or web address].    Your next appointment:   12 month(s)  The format for your next appointment:   In Person  Provider:   Candee Furbish, MD   Thank you for choosing [facility]!!         Signed, Candee Furbish, MD  05/20/2020 9:01 AM    [place]

## 2020-05-20 NOTE — Patient Instructions (Signed)
Medication Instructions:  The current medical regimen is effective;  continue present plan and medications.  *If you need a refill on your cardiac medications before your next appointment, please call your pharmacy*  Lab Work: Please have blood work today (BMP)  If you have labs (blood work) drawn today and your tests are completely normal, you will receive your results only by: Marland Kitchen MyChart Message (if you have MyChart) OR . A paper copy in the mail If you have any lab test that is abnormal or we need to change your treatment, we will call you to review the results.  Follow-Up: At [facility], you and your health needs are our priority.  As part of our continuing mission to provide you with exceptional heart care, we have created designated Provider Care Teams.  These Care Teams include your primary Cardiologist (physician) and Advanced Practice Providers (APPs -  Physician Assistants and Nurse Practitioners) who all work together to provide you with the care you need, when you need it.  We recommend signing up for the patient portal called "MyChart".  Sign up information is provided on this After Visit Summary.  MyChart is used to connect with patients for Virtual Visits (Telemedicine).  Patients are able to view lab/test results, encounter notes, upcoming appointments, etc.  Non-urgent messages can be sent to your provider as well.   To learn more about what you can do with MyChart, go to [email or web address].    Your next appointment:   12 month(s)  The format for your next appointment:   In Person  Provider:   Candee Furbish, MD   Thank you for choosing [facility]!!

## 2020-06-02 ENCOUNTER — Encounter: Payer: Self-pay | Admitting: Family Medicine

## 2020-06-02 ENCOUNTER — Ambulatory Visit (INDEPENDENT_AMBULATORY_CARE_PROVIDER_SITE_OTHER): Payer: Medicare Other | Admitting: Family Medicine

## 2020-06-02 VITALS — BP 143/92 | HR 87 | Ht 63.0 in | Wt 180.0 lb

## 2020-06-02 DIAGNOSIS — Z9989 Dependence on other enabling machines and devices: Secondary | ICD-10-CM | POA: Diagnosis not present

## 2020-06-02 DIAGNOSIS — G4733 Obstructive sleep apnea (adult) (pediatric): Secondary | ICD-10-CM | POA: Diagnosis not present

## 2020-06-02 NOTE — Progress Notes (Signed)
PATIENT: Brittney Williamson DOB: 1950-11-03  REASON FOR VISIT: follow up HISTORY FROM: patient  Chief Complaint  Patient presents with  . Follow-up    RM 2 alone Pt is doing very well      HISTORY OF PRESENT ILLNESS: 06/02/20 ALL:  She returns for follow up for OSA on CPAP therapy.   Compliance report dated 05/02/2020 through 05/31/2020 reveals that she used CPAP 29 in the past 30 days for compliance of 97%.  She used CPAP greater than 4 hours 19 of the past 30 days for compliance of 63%.  Average usage on days used was 5 hours and 6 minutes.  Residual AHI was 2.3 on 5 to 15 cm of water and an EPR of 1.  There was no significant leak noted.   06/02/2019 ALL:  Brittney Williamson is a 70 y.o. female here today for follow up for OSA on CPAP. She is doing very well with CPAP therapy. She has gotten accustomed to using her machine every night and for at least 4 hours. She recently had left knee surgery and is recovering well. She feels great today and without concerns.   Compliance report dated 05/02/2019 through 05/31/2019 reveals that she has used CPAP 29 of the last 30 days for compliance of 97%.  27 of the last 30 days she used CPAP greater than 4 hours for compliance of 90%.  Average usage was 6 hours and 18 minutes.  Residual AHI was 3.4 on 5 to 15 cm of water and an EPR of 1.  There was no significant leak noted.  HISTORY: (copied from my note on 01/07/2019)  Brittney LITZAU is a 70 y.o. female here today for follow up of OSA on CPAP. She has continued to work on compliance.  She admits that she does not sleep for more than 4 hours at night.  She does take naps during the day.  She denies any concerns with CPAP therapy.  Compliance report dated 10/09/2018 through 01/06/2019 reveals that she is using CPAP 80 out of the last 90 days for compliance of 89%.  57 days she used CPAP greater than 4 hours for compliance of 63%.  Average usage was 4 hours and 46 minutes.  AHI was slightly elevated at 7.1  on 5 to 15 cm of water and an EPR of 3.  There was no significant leak.  HISTORY: (copied from my note on 10/07/2018)  Brittney Williamson a 70 y.o.femalehere today for follow up of OSA on CPAP. She reports that she is continuing to adjust to CPAP therapy. She is trying to use her machine each night. Download report dated 09/07/2018 through 10/06/2018 reveals that over the last 30 days she is used her machine 27 out of 30 days for compliance of 90%. 19 days she used her machine greater than 4 hours for compliance of 63%. Average usage was about 4 hours and 56 minutes. AHI was 4.2 on 5 to 15 cm of water and an EPR level of 3. There was no significant leak. She does note benefit from CPAP therapy when used regularly.   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 After her diagnosis with sleep apnea out in the split-night titration study from 13 February 2016 she had voiced an interest in the dental device and had actually spoken to Dr. Ron Parker, but financial means were not such that she could pursue this treatment her AHI was rather high she had severe apnea with an AHI of 45.7 slightly accentuated in supine and in rem sleep. Her AHI became 1.3 after she was titrated to 9 cmH2O CPAP and she has therefore pursued for CPAP treatment she endorsed today the Epworth Sleepiness Scale at 6 points the fatigue severity scale of 0 the geriatric depression score at 1 out of 15.  Good compliance to CPAP however was spotty I do have a 3 months compliance data and for the month of November she did very well however in December she barely uses the CPAP every third day and she had not used it through the month of October either. CPAP is set at 9 cm water pressure with 3 cm EPR and the  average user time is 1 hour and 23 minutes. She does have very minor air leakage and some central apneas arising. I would like for her to continue on CPAP and she still owns an S9 AutoSet. The patient stated her sleep study felt not representative for her normal sleep.  Can she repeat a study? I will arrange for a HST.    UPDATE 01/23/2017.CM BrittneyWilliamson, 70 year old female returns for follow-up for CPAP compliance. She has severe obstructive sleep apnea and periodic limb movements. CPAP compliance data dated 12/24/2016-01/22/2017 shows greater than 4 hours for 17 days or 57%. Average usage 5 hours 5 minutes. Set pressure 9 cm EPR 3AHI 6.8.ESS 8. FSS 22. She returns for reevaluation.   07/02/16 CMMs.Brittney Williamson, 70 year old female returns for CPAP compliance after having a split-night titration sleep study on 02/13/2016. Her last compliance report greater than 4 hours 63% for 19 days. Her compliance today is 25 out of 30 days 83% compliance greater than 4 hours compliance 67%.  Her sleep shows severe obstructive sleep apnea and periodic limb movements of sleep unknown clinical significance. She is on CPAP at 9 cm. She is advised to avoid sedative hypnotics which may worsen her sleep apnea also alcohol and tobacco. She claims today that sometimes when she goes to bed she is too tired and forgets to use the appliance. She denies having any illnesses but she could not use.Average usage for days used 5 hours 23 minutes at 9 cm of pressure EPR level 3. AHI for 3.1. She returns for reevaluation   REVIEW OF SYSTEMS: Out of a complete 14 system review of symptoms, the patient complains only of the following symptoms, muscle cramps, fatigue and all other reviewed systems are negative  ESS: 9 FSS: 36  ALLERGIES: Allergies  Allergen Reactions  . Penicillins Itching  . Trimox [Amoxicillin Trihydrate] Itching    HOME MEDICATIONS: Outpatient Medications Prior to Visit  Medication Sig Dispense  Refill  . acyclovir (ZOVIRAX) 400 MG tablet Take 400 mg by mouth as needed (every 8 hours as needed).     Marland Kitchen co-enzyme Q-10 50 MG capsule Take 50 mg by mouth daily.    . dorzolamide-timolol (COSOPT) 22.3-6.8 MG/ML ophthalmic solution Place 1 drop into the right eye 2 (two) times daily.    Marland Kitchen ELDERBERRY PO Take 2 tablets by mouth daily.    . fenofibrate (TRICOR) 145 MG tablet Take 1 tablet (145 mg total) by mouth daily. 30 tablet 11  . magnesium 30 MG tablet Take 30 mg by mouth 2 (two) times daily.    Marland Kitchen  melatonin 3 MG TABS tablet Take 5 mg by mouth at bedtime.    . rosuvastatin (CRESTOR) 10 MG tablet Take 1 tablet (10 mg total) by mouth daily. 30 tablet 11  . vitamin B-12 (CYANOCOBALAMIN) 1000 MCG tablet Take 1,000 mcg by mouth daily.    Marland Kitchen VITAMIN D PO Take 20,000 Units by mouth daily.     . Zinc Gluconate 30 MG TABS Take by mouth.    . zinc gluconate 50 MG tablet Take 30 mg by mouth daily as needed.     No facility-administered medications prior to visit.    PAST MEDICAL HISTORY: Past Medical History:  Diagnosis Date  . Mitral valve prolapse 1993  . OSA on CPAP     PAST SURGICAL HISTORY: Past Surgical History:  Procedure Laterality Date  . KNEE ARTHROSCOPY WITH MEDIAL MENISECTOMY Left 05/27/2019  . WRIST SURGERY Right 2017    FAMILY HISTORY: Family History  Adopted: Yes    SOCIAL HISTORY: Social History   Socioeconomic History  . Marital status: Single    Spouse name: Not on file  . Number of children: Not on file  . Years of education: Not on file  . Highest education level: Not on file  Occupational History  . Not on file  Tobacco Use  . Smoking status: Never Smoker  . Smokeless tobacco: Never Used  Substance and Sexual Activity  . Alcohol use: Yes    Comment: occ  . Drug use: Never  . Sexual activity: Not Currently  Other Topics Concern  . Not on file  Social History Narrative  . Not on file   Social Determinants of Health   Financial Resource Strain:  Not on file  Food Insecurity: Not on file  Transportation Needs: Not on file  Physical Activity: Not on file  Stress: Not on file  Social Connections: Not on file  Intimate Partner Violence: Not on file      PHYSICAL EXAM  Vitals:   06/02/20 1018  BP: (!) 143/92  Pulse: 87  Weight: 180 lb (81.6 kg)  Height: 5\' 3"  (1.6 m)   Body mass index is 31.89 kg/m.  Generalized: Well developed, in no acute distress  Cardiology: normal rate and rhythm, no murmur noted Respiratory: Clear to auscultation bilaterally  Neurological examination  Mentation: Alert oriented to time, place, history taking. Follows all commands speech and language fluent Cranial nerve II-XII: Pupils were equal round reactive to light. Extraocular movements were full, visual field were full  Motor: The motor testing reveals 5 over 5 strength of all 4 extremities. Good symmetric motor tone is noted throughout.  Gait and station: Gait is normal.   DIAGNOSTIC DATA (LABS, IMAGING, TESTING) - I reviewed patient records, labs, notes, testing and imaging myself where available.  No flowsheet data found.   Lab Results  Component Value Date   WBC 6.3 09/05/2016   HGB 14.7 09/05/2016   HCT 45.4 09/05/2016   MCV 85.2 09/05/2016   PLT 231 09/05/2016      Component Value Date/Time   NA 145 (H) 05/20/2020 0829   K 4.3 05/20/2020 0829   CL 106 05/20/2020 0829   CO2 27 05/20/2020 0829   GLUCOSE 115 (H) 05/20/2020 0829   GLUCOSE 99 09/05/2016 1614   BUN 14 05/20/2020 0829   CREATININE 0.93 05/20/2020 0829   CREATININE 0.80 11/18/2015 0811   CALCIUM 9.4 05/20/2020 0829   PROT 7.2 11/06/2019 0832   ALBUMIN 4.4 11/06/2019 0832   AST 40 11/06/2019  0832   ALT 50 (H) 11/06/2019 0832   ALKPHOS 90 11/06/2019 0832   BILITOT 0.3 11/06/2019 0832   GFRNONAA 63 05/20/2020 0829   GFRNONAA 78 11/18/2015 0811   GFRAA 73 05/20/2020 0829   GFRAA >89 11/18/2015 0811   Lab Results  Component Value Date   CHOL 132 11/17/2019    HDL 38 (L) 11/17/2019   LDLCALC 64 11/17/2019   LDLDIRECT 78 11/06/2019   TRIG 179 (H) 11/17/2019   CHOLHDL 3.5 11/17/2019   No results found for: HGBA1C No results found for: VITAMINB12 Lab Results  Component Value Date   TSH 0.848 11/08/2017       ASSESSMENT AND PLAN 70 y.o. year old female  has a past medical history of Mitral valve prolapse (1993) and OSA on CPAP. here with     ICD-10-CM   1. OSA on CPAP  G47.33 For home use only DME continuous positive airway pressure (CPAP)   Z99.89      [place] is doing very well with CPAP therapy. Compliance report reveals excellent daily compliance. 4 hour compliance is sub optimal.  She was encouraged to continue using CPAP nightly and for greater than 4 hours each night. I will reprint download to assess compliance in 6-8 weeks.  She will follow-up with [place] in 1 year, sooner if needed pending review of compliance.  She verbalizes understanding and agreement with this plan.    Orders Placed This Encounter  Procedures  . For home use only DME continuous positive airway pressure (CPAP)    Supplies    Order Specific Question:   Length of Need    Answer:   Lifetime    Order Specific Question:   Patient has OSA or probable OSA    Answer:   Yes    Order Specific Question:   Is the patient currently using CPAP in the home    Answer:   Yes    Order Specific Question:   Settings    Answer:   Other see comments    Order Specific Question:   CPAP supplies needed    Answer:   Mask, headgear, cushions, filters, heated tubing and water chamber     No orders of the defined types were placed in this encounter.     I spent 15 minutes with the patient. 50% of this time was spent counseling and educating patient on plan of care and medications.    Debbora Presto, FNP-C 06/02/2020, 11:04 AM [facility] [address] [place], [place] [address] [phone]

## 2020-06-02 NOTE — Patient Instructions (Signed)
Please continue using your CPAP regularly. While your insurance requires that you use CPAP at least 4 hours each night on 70% of the nights, I recommend, that you not skip any nights and use it throughout the night if you can. Getting used to CPAP and staying with the treatment long term does take time and patience and discipline. Untreated obstructive sleep apnea when it is moderate to severe can have an adverse impact on cardiovascular health and raise her risk for heart disease, arrhythmias, hypertension, congestive heart failure, stroke and diabetes. Untreated obstructive sleep apnea causes sleep disruption, nonrestorative sleep, and sleep deprivation. This can have an impact on your day to day functioning and cause daytime sleepiness and impairment of cognitive function, memory loss, mood disturbance, and problems focussing. Using CPAP regularly can improve these symptoms.   Follow up in 1 year   Sleep Apnea Sleep apnea affects breathing during sleep. It causes breathing to stop for a short time or to become shallow. It can also increase the risk of:  Heart attack.  Stroke.  Being very overweight (obese).  Diabetes.  Heart failure.  Irregular heartbeat. The goal of treatment is to help you breathe normally again. What are the causes? There are three kinds of sleep apnea:  Obstructive sleep apnea. This is caused by a blocked or collapsed airway.  Central sleep apnea. This happens when the brain does not send the right signals to the muscles that control breathing.  Mixed sleep apnea. This is a combination of obstructive and central sleep apnea. The most common cause of this condition is a collapsed or blocked airway. This can happen if:  Your throat muscles are too relaxed.  Your tongue and tonsils are too large.  You are overweight.  Your airway is too small.   What increases the risk?  Being overweight.  Smoking.  Having a small airway.  Being older.  Being  female.  Drinking alcohol.  Taking medicines to calm yourself (sedatives or tranquilizers).  Having family members with the condition. What are the signs or symptoms?  Trouble staying asleep.  Being sleepy or tired during the day.  Getting angry a lot.  Loud snoring.  Headaches in the morning.  Not being able to focus your mind (concentrate).  Forgetting things.  Less interest in sex.  Mood swings.  Personality changes.  Feelings of sadness (depression).  Waking up a lot during the night to pee (urinate).  Dry mouth.  Sore throat. How is this diagnosed?  Your medical history.  A physical exam.  A test that is done when you are sleeping (sleep study). The test is most often done in a sleep lab but may also be done at home. How is this treated?  Sleeping on your side.  Using a medicine to get rid of mucus in your nose (decongestant).  Avoiding the use of alcohol, medicines to help you relax, or certain pain medicines (narcotics).  Losing weight, if needed.  Changing your diet.  Not smoking.  Using a machine to open your airway while you sleep, such as: ? An oral appliance. This is a mouthpiece that shifts your lower jaw forward. ? A CPAP device. This device blows air through a mask when you breathe out (exhale). ? An EPAP device. This has valves that you put in each nostril. ? A BPAP device. This device blows air through a mask when you breathe in (inhale) and breathe out.  Having surgery if other treatments do not work. It   is important to get treatment for sleep apnea. Without treatment, it can lead to:  High blood pressure.  Coronary artery disease.  In men, not being able to have an erection (impotence).  Reduced thinking ability.   Follow these instructions at home: Lifestyle  Make changes that your doctor recommends.  Eat a healthy diet.  Lose weight if needed.  Avoid alcohol, medicines to help you relax, and some pain  medicines.  Do not use any products that contain nicotine or tobacco, such as cigarettes, e-cigarettes, and chewing tobacco. If you need help quitting, ask your doctor. General instructions  Take over-the-counter and prescription medicines only as told by your doctor.  If you were given a machine to use while you sleep, use it only as told by your doctor.  If you are having surgery, make sure to tell your doctor you have sleep apnea. You may need to bring your device with you.  Keep all follow-up visits as told by your doctor. This is important. Contact a doctor if:  The machine that you were given to use during sleep bothers you or does not seem to be working.  You do not get better.  You get worse. Get help right away if:  Your chest hurts.  You have trouble breathing in enough air.  You have an uncomfortable feeling in your back, arms, or stomach.  You have trouble talking.  One side of your body feels weak.  A part of your face is hanging down. These symptoms may be an emergency. Do not wait to see if the symptoms will go away. Get medical help right away. Call your local emergency services (911 in the [place] Do not drive yourself to the hospital. Summary  This condition affects breathing during sleep.  The most common cause is a collapsed or blocked airway.  The goal of treatment is to help you breathe normally while you sleep. This information is not intended to replace advice given to you by your health care provider. Make sure you discuss any questions you have with your health care provider. Document Revised: 01/24/2018 Document Reviewed: 12/03/2017 Elsevier Patient Education  2021 [employer] Inc.  

## 2020-06-13 NOTE — Progress Notes (Signed)
Cm sent to [facility]

## 2020-06-22 DIAGNOSIS — M170 Bilateral primary osteoarthritis of knee: Secondary | ICD-10-CM | POA: Diagnosis not present

## 2020-07-13 DIAGNOSIS — M1712 Unilateral primary osteoarthritis, left knee: Secondary | ICD-10-CM | POA: Diagnosis not present

## 2020-07-13 DIAGNOSIS — Z1231 Encounter for screening mammogram for malignant neoplasm of breast: Secondary | ICD-10-CM | POA: Diagnosis not present

## 2020-07-14 ENCOUNTER — Other Ambulatory Visit: Payer: Self-pay | Admitting: Orthopedic Surgery

## 2020-07-14 DIAGNOSIS — R531 Weakness: Secondary | ICD-10-CM

## 2020-07-14 DIAGNOSIS — M25562 Pain in left knee: Secondary | ICD-10-CM

## 2020-07-17 ENCOUNTER — Ambulatory Visit
Admission: RE | Admit: 2020-07-17 | Discharge: 2020-07-17 | Disposition: A | Discharge status: Home / Self Care | Payer: Medicare Other | Source: Ambulatory Visit | Attending: Orthopedic Surgery | Admitting: Orthopedic Surgery

## 2020-07-17 ENCOUNTER — Other Ambulatory Visit: Payer: Self-pay

## 2020-07-17 DIAGNOSIS — M25562 Pain in left knee: Secondary | ICD-10-CM | POA: Diagnosis not present

## 2020-07-17 DIAGNOSIS — R531 Weakness: Secondary | ICD-10-CM

## 2020-07-18 ENCOUNTER — Other Ambulatory Visit: Payer: Self-pay | Admitting: Orthopedic Surgery

## 2020-07-18 DIAGNOSIS — M25562 Pain in left knee: Secondary | ICD-10-CM

## 2020-07-18 DIAGNOSIS — Z9889 Other specified postprocedural states: Secondary | ICD-10-CM

## 2020-07-18 DIAGNOSIS — R531 Weakness: Secondary | ICD-10-CM

## 2020-07-19 ENCOUNTER — Telehealth: Payer: Self-pay | Admitting: Family Medicine

## 2020-07-19 NOTE — Telephone Encounter (Signed)
I am not certain what she is referring to. I did not document anything about a COVID vaccine and we do not administer these. Not sure what documentation she is referring to.

## 2020-07-19 NOTE — Telephone Encounter (Signed)
Contacted pt regarding CPAP compliance, informed her that Amy has reviewed her compliance report. She very proud of her. Compliance is 100% with daily and 4 hour use! AHI is normal at 2.1/hr. I encouraged her to keep up the good work. And to contact the office with further questions. She understood.   She also stated that she got a call from [facility] telling her not to take the booster shot due to her having a heart condition and high blood pressure, they informed her that our office put it in her chart and she would like it removed ASAP.

## 2020-07-19 NOTE — Telephone Encounter (Signed)
Please let her know that I have reviewed her compliance report. I am very proud of her. Compliance is 100% with daily and 4 hour use! AHI is normal at 2.1/hr. Encourage her to keep up the good work. We will see her next year. TY!

## 2020-07-19 NOTE — Telephone Encounter (Signed)
She wasn't saying we was giving her the booster, she stated we had put a heart condition and high BP in her chart, and she cant get the booster now and wanted that removed from chart

## 2020-07-20 ENCOUNTER — Encounter: Payer: Self-pay | Admitting: *Deleted

## 2020-07-20 NOTE — Telephone Encounter (Signed)
I called pt not able to leave a message. Pt needs to contact her PCP or [place] was entered by physical therapist. [phone].

## 2020-07-20 NOTE — Telephone Encounter (Signed)
Brittney Williamson, can you help her? She reports that PCP refuses to give her Covid booster due to [place] documenting she has a "heart condition" and "blood pressure problems". We see her for OSA on CPAP. I have never changed history for patient nor used either HTN or mitral valve prolapse as a diagnosis code. I see that a physical therapist entered history of MVP but no documentation at all of HTN. Thank you so much for your help and let me know if this needs to go to someone else.

## 2020-07-21 DIAGNOSIS — Z471 Aftercare following joint replacement surgery: Secondary | ICD-10-CM | POA: Diagnosis not present

## 2020-07-21 DIAGNOSIS — M2352 Chronic instability of knee, left knee: Secondary | ICD-10-CM | POA: Diagnosis not present

## 2020-09-07 ENCOUNTER — Telehealth: Payer: Self-pay | Admitting: Neurology

## 2020-09-07 DIAGNOSIS — Z8669 Personal history of other diseases of the nervous system and sense organs: Secondary | ICD-10-CM | POA: Diagnosis not present

## 2020-09-07 DIAGNOSIS — H2513 Age-related nuclear cataract, bilateral: Secondary | ICD-10-CM | POA: Diagnosis not present

## 2020-09-07 NOTE — Telephone Encounter (Signed)
Order was sent to [facility] in feb at the recent visit. I will also resend to make sure.  If pt calls back in please let her know it has been sent.

## 2020-09-07 NOTE — Telephone Encounter (Signed)
Brittney Williamson this patient has called, left vm message stating she needs new prescription for Cpap supplies sent to [facility]. Thank you

## 2020-09-07 NOTE — Telephone Encounter (Signed)
I have also signed the order in the [facility] go scripts so patient from our end is good to go.

## 2020-09-08 ENCOUNTER — Telehealth: Payer: Self-pay | Admitting: Family Medicine

## 2020-09-08 NOTE — Telephone Encounter (Signed)
Called and LMVM for pt that did send another message to [facility] about order that is in Epic 06-02-20 for cpap supplies.

## 2020-09-08 NOTE — Telephone Encounter (Signed)
Pt called stating that she is needing a new prescription sent to her DME for her supplies due to her old prescription being expired. Please advise.

## 2020-09-12 NOTE — Telephone Encounter (Signed)
RE: cpap supplies Received: 4 days ago Jacquelyne Balint, RN Thank you, will process.   Janett Billow      Previous Messages   ----- Message -----  From: Brandon Melnick, RN  Sent: 09/08/2020  4:25 PM EDT  To: Mady Gemma  Subject: cpap supplies                   Hi this pt contacted [place] stating needed new order for cpap supplies. Damaris Hippo. Plitt  Female, 70 y.o., May 10, 1950. There is an order form 06-02-2020 for cpap supplies.    MRN:  [id number]   Thank you, Lovey Newcomer RN

## 2020-09-28 ENCOUNTER — Telehealth: Payer: Self-pay | Admitting: Cardiology

## 2020-09-28 MED ORDER — FENOFIBRATE 145 MG PO TABS: 145.0000 mg | ORAL_TABLET | Freq: Every day | ORAL | 3 refills | Status: DC

## 2020-09-28 MED ORDER — ROSUVASTATIN CALCIUM 10 MG PO TABS: 10.0000 mg | ORAL_TABLET | Freq: Every day | ORAL | 3 refills | Status: DC

## 2020-09-28 NOTE — Telephone Encounter (Signed)
Refills completed per patient request and sent to [facility].

## 2020-09-28 NOTE — Telephone Encounter (Signed)
*  STAT* If patient is at the pharmacy, call can be transferred to refill team.   1. Which medications need to be refilled? (please list name of each medication and dose if known) rosuvastatin (CRESTOR) 10 MG tablet fenofibrate (TRICOR) 145 MG tablet   2. Which pharmacy/location (including street and city if local pharmacy) is medication to be sent to? [facility] [address] [place]  3. Do they need a 30 day or 90 day supply? 90 ds

## 2020-10-11 ENCOUNTER — Other Ambulatory Visit: Payer: Medicare Other

## 2020-10-13 ENCOUNTER — Other Ambulatory Visit: Payer: Medicare Other

## 2020-10-14 ENCOUNTER — Encounter (HOSPITAL_COMMUNITY): Payer: Self-pay

## 2020-10-14 ENCOUNTER — Ambulatory Visit (HOSPITAL_COMMUNITY)
Admission: RE | Admit: 2020-10-14 | Discharge: 2020-10-14 | Disposition: A | Discharge status: Home / Self Care | Payer: Medicare Other | Source: Ambulatory Visit | Attending: Emergency Medicine | Admitting: Emergency Medicine

## 2020-10-14 ENCOUNTER — Other Ambulatory Visit: Payer: Self-pay

## 2020-10-14 VITALS — BP 158/90 | HR 77 | Temp 98.6°F | Resp 16

## 2020-10-14 DIAGNOSIS — L237 Allergic contact dermatitis due to plants, except food: Secondary | ICD-10-CM | POA: Diagnosis not present

## 2020-10-14 MED ORDER — PREDNISONE 10 MG (21) PO TBPK: ORAL_TABLET | Freq: Every day | ORAL | 0 refills | Status: DC

## 2020-10-14 NOTE — Discharge Instructions (Addendum)
Take the Prednisone taper as prescribed.    You can use calamine lotion, anti-itch, or benadryl lotion as needed for itch.   You can also take Benadryl as needed but do not take it prior to driving.    Return or go to the Emergency Department if symptoms worsen or do not improve in the next few days.

## 2020-10-14 NOTE — ED Provider Notes (Signed)
[facility]    CSN: [id number] Arrival date & time: 10/14/20  1843      History   Chief Complaint Chief Complaint  Patient presents with   Poison Ivy    HPI Brittney Williamson is a 70 y.o. female.   Patient here for evaluation of rash on her face and back started approximately 2 days ago.  Reports similar symptoms with poison ivy in the past that has required treatment with prednisone.  Reports rash is itchy and speading.  Reports using a OTC lotions with minimal relief.  Denies any trauma, injury, or other precipitating event.  Denies any specific alleviating or aggravating factors.  Denies any fevers, chest pain, shortness of breath, N/V/D, numbness, tingling, weakness, abdominal pain, or headaches.     The history is provided by the patient.  Poison Ivy   Past Medical History:  Diagnosis Date   Mitral valve prolapse 1993   OSA on CPAP     Patient Active Problem List   Diagnosis Date Noted   Osteopenia 11/10/2017   OSA on CPAP 01/12/2016   Insomnia with sleep apnea 01/12/2016   Essential hypertension 11/30/2015   Hyperlipidemia 11/30/2015    Past Surgical History:  Procedure Laterality Date   KNEE ARTHROSCOPY WITH MEDIAL MENISECTOMY Left 05/27/2019   WRIST SURGERY Right 2017    OB History   No obstetric history on file.      Home Medications    Prior to Admission medications   Medication Sig Start Date End Date Taking? Authorizing Provider  predniSONE (STERAPRED UNI-PAK 21 TAB) 10 MG (21) TBPK tablet Take by mouth daily. Take 6 tabs by mouth daily  for 2 days, then 5 tabs for 2 days, then 4 tabs for 2 days, then 3 tabs for 2 days, 2 tabs for 2 days, then 1 tab by mouth daily for 2 days 10/14/20  Yes Pearson Forster, NP  acyclovir (ZOVIRAX) 400 MG tablet Take 400 mg by mouth as needed (every 8 hours as needed).  05/19/19   [provider]  co-enzyme Q-10 50 MG capsule Take 50 mg by mouth daily.    [provider]   dorzolamide-timolol (COSOPT) 22.3-6.8 MG/ML ophthalmic solution Place 1 drop into the right eye 2 (two) times daily. 08/24/19   [provider]  ELDERBERRY PO Take 2 tablets by mouth daily.    [provider]  fenofibrate (TRICOR) 145 MG tablet Take 1 tablet (145 mg total) by mouth daily. 09/28/20   Jerline Pain, MD  magnesium 30 MG tablet Take 30 mg by mouth 2 (two) times daily.    [provider]  melatonin 3 MG TABS tablet Take 5 mg by mouth at bedtime.    [provider]  rosuvastatin (CRESTOR) 10 MG tablet Take 1 tablet (10 mg total) by mouth daily. 09/28/20   Jerline Pain, MD  vitamin B-12 (CYANOCOBALAMIN) 1000 MCG tablet Take 1,000 mcg by mouth daily.    [provider]  VITAMIN D PO Take 20,000 Units by mouth daily.     [provider]  Zinc Gluconate 30 MG TABS Take by mouth.    [provider]    Family History Family History  Adopted: Yes    Social History Social History   Tobacco Use   Smoking status: Never   Smokeless tobacco: Never  Substance Use Topics   Alcohol use: Yes    Comment: occ   Drug use: Never  Allergies   Penicillins and Trimox [amoxicillin trihydrate]   Review of Systems Review of Systems  Skin:  Positive for rash.  All other systems reviewed and are negative.   Physical Exam Triage Vital Signs ED Triage Vitals  Enc Vitals Group     BP 10/14/20 1855 (!) 158/90     Pulse Rate 10/14/20 1855 77     Resp 10/14/20 1855 16     Temp 10/14/20 1855 98.6 F (37 C)     Temp Source 10/14/20 1855 Oral     SpO2 10/14/20 1855 100 %     Weight --      Height --      Head Circumference --      Peak Flow --      Pain Score 10/14/20 1853 0     Pain Loc --      Pain Edu? --      Excl. in [place]? --    No data found.  Updated Vital Signs BP (!) 158/90 (BP Location: Left Arm)   Pulse 77   Temp 98.6 F (37 C) (Oral)   Resp 16   SpO2 100%   Visual Acuity Right Eye Distance:   Left  Eye Distance:   Bilateral Distance:    Right Eye Near:   Left Eye Near:    Bilateral Near:     Physical Exam Vitals and nursing note reviewed.  Constitutional:      General: She is not in acute distress.    Appearance: Normal appearance. She is not ill-appearing, toxic-appearing or diaphoretic.  HENT:     Head: Normocephalic and atraumatic.  Eyes:     Conjunctiva/sclera: Conjunctivae normal.  Cardiovascular:     Rate and Rhythm: Normal rate.     Pulses: Normal pulses.  Pulmonary:     Effort: Pulmonary effort is normal.  Abdominal:     General: Abdomen is flat.  Musculoskeletal:        General: Normal range of motion.     Cervical back: Normal range of motion.  Skin:    General: Skin is warm and dry.     Findings: Rash (face- bilateral cheeks, neck, and upper chest) present. Rash is pustular.  Neurological:     General: No focal deficit present.     Mental Status: She is alert and oriented to person, place, and time.  Psychiatric:        Mood and Affect: Mood normal.     UC Treatments / Results  Labs (all labs ordered are listed, but only abnormal results are displayed) Labs Reviewed - No data to display  EKG   Radiology No results found.  Procedures Procedures (including critical care time)  Medications Ordered in UC Medications - No data to display  Initial Impression / Assessment and Plan / UC Course  I have reviewed the triage vital signs and the nursing notes.  Pertinent labs & imaging results that were available during my care of the patient were reviewed by me and considered in my medical decision making (see chart for details).    Assessment negative for red flags or concerns.  This is likely poison ivy dermatitis.  We will treat with prednisone taper.  May use calamine or anti-itch lotions for symptom management.  May take Benadryl as needed for itching.  Instructed patient to avoid hot water.  Follow-up if symptoms do not improve in the next few  days otherwise follow-up with primary care as needed Final Clinical Impressions(s) /  UC Diagnoses   Final diagnoses:  Poison ivy dermatitis     Discharge Instructions      Take the Prednisone taper as prescribed.    You can use calamine lotion, anti-itch, or benadryl lotion as needed for itch.   You can also take Benadryl as needed but do not take it prior to driving.    Return or go to the Emergency Department if symptoms worsen or do not improve in the next few days.      ED Prescriptions     Medication Sig Dispense Auth. Provider   predniSONE (STERAPRED UNI-PAK 21 TAB) 10 MG (21) TBPK tablet Take by mouth daily. Take 6 tabs by mouth daily  for 2 days, then 5 tabs for 2 days, then 4 tabs for 2 days, then 3 tabs for 2 days, 2 tabs for 2 days, then 1 tab by mouth daily for 2 days 42 tablet Pearson Forster, NP      PDMP not reviewed this encounter.   Pearson Forster, NP 10/14/20 [phone]

## 2020-10-14 NOTE — ED Triage Notes (Signed)
Pt present rash on her face with itching and spreading to right eye, the symptoms started two days ago.

## 2020-10-29 DIAGNOSIS — Z20822 Contact with and (suspected) exposure to covid-19: Secondary | ICD-10-CM | POA: Diagnosis not present

## 2020-11-23 ENCOUNTER — Ambulatory Visit (HOSPITAL_COMMUNITY)
Admission: RE | Admit: 2020-11-23 | Discharge: 2020-11-23 | Disposition: A | Discharge status: Home / Self Care | Payer: Medicare Other | Source: Ambulatory Visit | Attending: Internal Medicine | Admitting: Internal Medicine

## 2020-11-23 ENCOUNTER — Other Ambulatory Visit: Payer: Self-pay

## 2020-11-23 ENCOUNTER — Encounter (HOSPITAL_COMMUNITY): Payer: Self-pay

## 2020-11-23 VITALS — BP 122/83 | HR 101 | Temp 100.3°F | Resp 17

## 2020-11-23 DIAGNOSIS — U071 COVID-19: Secondary | ICD-10-CM | POA: Insufficient documentation

## 2020-11-23 DIAGNOSIS — J029 Acute pharyngitis, unspecified: Secondary | ICD-10-CM | POA: Insufficient documentation

## 2020-11-23 DIAGNOSIS — R059 Cough, unspecified: Secondary | ICD-10-CM | POA: Diagnosis not present

## 2020-11-23 DIAGNOSIS — R509 Fever, unspecified: Secondary | ICD-10-CM | POA: Insufficient documentation

## 2020-11-23 LAB — POCT RAPID STREP A, ED / UC: Streptococcus, Group A Screen (Direct): NEGATIVE

## 2020-11-23 MED ORDER — PREDNISONE 10 MG (21) PO TBPK: ORAL_TABLET | Freq: Every day | ORAL | 0 refills | Status: DC

## 2020-11-23 MED ORDER — BENZONATATE 100 MG PO CAPS: 100.0000 mg | ORAL_CAPSULE | Freq: Three times a day (TID) | ORAL | 0 refills | Status: DC | PRN

## 2020-11-23 NOTE — ED Triage Notes (Signed)
Pt presents with headache, fever, and runny nose xs 4 days. States at home COVID test showed positive result on 11/21/2020 Last dose of tylenol was last pm.

## 2020-11-23 NOTE — Discharge Instructions (Signed)
Important that you treat symptoms over-the-counter as well as taking prescription medications. 1. Take a daily allergy pill/anti-histamine like Zyrtec, Claritin, or Store brand consistently for 2 weeks  2. For congestion you may try an oral decongestant like Mucinex or sudafed. You may also try intranasal flonase nasal spray or saline irrigations (neti pot, sinus cleanse)  3. For your sore throat you may try cepacol lozenges, salt water gargles, throat spray. Treatment of congestion may also help your sore throat.  4. For cough you may try Robitussen, Mucinex DM  5. Take Tylenol or Ibuprofen to help with pain/inflammation  6. Stay hydrated, drink plenty of fluids to keep throat coated and less irritated  Honey Tea For cough/sore throat try using a honey-based tea. Use 3 teaspoons of honey with juice squeezed from half lemon. Place shaved pieces of ginger into 1/2-1 cup of water and warm over stove top. Then mix the ingredients and repeat every 4 hours as needed.

## 2020-11-23 NOTE — ED Provider Notes (Signed)
[place]    CSN: [id number] Arrival date & time: 11/23/20  1550      History   Chief Complaint Chief Complaint  Patient presents with   Covid Positive   Fever   Headache   Nasal Congestion    HPI Brittney Williamson is a 70 y.o. female.   Patient presents with 4-day history of intermittent headache, fever, runny nose, cough. Took an at home COVID-19 test approximately 3 days ago and it was positive.  Has been taking over-the-counter Tylenol with minimal relief of fever and symptoms.  Denies any chest pain or shortness of breath.   Fever Headache  Past Medical History:  Diagnosis Date   Mitral valve prolapse 1993   OSA on CPAP     Patient Active Problem List   Diagnosis Date Noted   Osteopenia 11/10/2017   OSA on CPAP 01/12/2016   Insomnia with sleep apnea 01/12/2016   Essential hypertension 11/30/2015   Hyperlipidemia 11/30/2015    Past Surgical History:  Procedure Laterality Date   KNEE ARTHROSCOPY WITH MEDIAL MENISECTOMY Left 05/27/2019   WRIST SURGERY Right 2017    OB History   No obstetric history on file.      Home Medications    Prior to Admission medications   Medication Sig Start Date End Date Taking? Authorizing Provider  benzonatate (TESSALON) 100 MG capsule Take 1 capsule (100 mg total) by mouth every 8 (eight) hours as needed for cough. 11/23/20  Yes Odis Luster, FNP  predniSONE (STERAPRED UNI-PAK 21 TAB) 10 MG (21) TBPK tablet Take by mouth daily. Take 6 tabs by mouth daily  for 2 days, then 5 tabs for 2 days, then 4 tabs for 2 days, then 3 tabs for 2 days, 2 tabs for 2 days, then 1 tab by mouth daily for 2 days 11/23/20  Yes Odis Luster, FNP  acyclovir (ZOVIRAX) 400 MG tablet Take 400 mg by mouth as needed (every 8 hours as needed).  05/19/19   [provider]  co-enzyme Q-10 50 MG capsule Take 50 mg by mouth daily.    [provider]  dorzolamide-timolol (COSOPT) 22.3-6.8 MG/ML ophthalmic solution Place 1  drop into the right eye 2 (two) times daily. 08/24/19   [provider]  ELDERBERRY PO Take 2 tablets by mouth daily.    [provider]  fenofibrate (TRICOR) 145 MG tablet Take 1 tablet (145 mg total) by mouth daily. 09/28/20   Jerline Pain, MD  magnesium 30 MG tablet Take 30 mg by mouth 2 (two) times daily.    [provider]  melatonin 3 MG TABS tablet Take 5 mg by mouth at bedtime.    [provider]  rosuvastatin (CRESTOR) 10 MG tablet Take 1 tablet (10 mg total) by mouth daily. 09/28/20   Jerline Pain, MD  vitamin B-12 (CYANOCOBALAMIN) 1000 MCG tablet Take 1,000 mcg by mouth daily.    [provider]  VITAMIN D PO Take 20,000 Units by mouth daily.     [provider]  Zinc Gluconate 30 MG TABS Take by mouth.    [provider]    Family History Family History  Adopted: Yes    Social History Social History   Tobacco Use   Smoking status: Never   Smokeless tobacco: Never  Substance Use Topics   Alcohol use: Yes    Comment: occ   Drug use: Never     Allergies   Penicillins and Trimox [  amoxicillin trihydrate]   Review of Systems Review of Systems Per HPI  Physical Exam Triage Vital Signs ED Triage Vitals  Enc Vitals Group     BP 11/23/20 1626 122/83     Pulse Rate 11/23/20 1626 (!) 101     Resp 11/23/20 1626 17     Temp 11/23/20 1626 100.3 F (37.9 C)     Temp Source 11/23/20 1626 Oral     SpO2 11/23/20 1626 95 %     Weight --      Height --      Head Circumference --      Peak Flow --      Pain Score 11/23/20 1624 5     Pain Loc --      Pain Edu? --      Excl. in [place]? --    No data found.  Updated Vital Signs BP 122/83 (BP Location: Left Arm)   Pulse (!) 101   Temp 100.3 F (37.9 C) (Oral)   Resp 17   SpO2 95%   Visual Acuity Right Eye Distance:   Left Eye Distance:   Bilateral Distance:    Right Eye Near:   Left Eye Near:    Bilateral Near:     Physical Exam Constitutional:       General: She is not in acute distress.    Appearance: Normal appearance.  HENT:     Head: Normocephalic and atraumatic.     Right Ear: Tympanic membrane and ear canal normal.     Left Ear: Tympanic membrane and ear canal normal.     Nose: Congestion present.     Mouth/Throat:     Mouth: Mucous membranes are moist.     Pharynx: Posterior oropharyngeal erythema present.  Eyes:     Extraocular Movements: Extraocular movements intact.     Conjunctiva/sclera: Conjunctivae normal.     Pupils: Pupils are equal, round, and reactive to light.  Cardiovascular:     Rate and Rhythm: Normal rate and regular rhythm.     Pulses: Normal pulses.     Heart sounds: Normal heart sounds.  Pulmonary:     Effort: Pulmonary effort is normal. No respiratory distress.     Breath sounds: Normal breath sounds. No wheezing or rhonchi.  Abdominal:     General: Abdomen is flat. Bowel sounds are normal.     Palpations: Abdomen is soft.  Musculoskeletal:        General: Normal range of motion.     Cervical back: Normal range of motion.  Skin:    General: Skin is warm and dry.  Neurological:     General: No focal deficit present.     Mental Status: She is alert and oriented to person, place, and time. Mental status is at baseline.  Psychiatric:        Mood and Affect: Mood normal.        Behavior: Behavior normal.     UC Treatments / Results  Labs (all labs ordered are listed, but only abnormal results are displayed) Labs Reviewed  CULTURE, GROUP A STREP [facility])  POCT RAPID STREP A, ED / UC    EKG   Radiology No results found.  Procedures Procedures (including critical care time)  Medications Ordered in UC Medications - No data to display  Initial Impression / Assessment and Plan / UC Course  I have reviewed the triage vital signs and the nursing notes.  Pertinent labs & imaging results that were available during  my care of the patient were reviewed by me and considered in my medical  decision making (see chart for details).     Rapid strep test was negative in urgent care today.  Throat culture is pending.  Discussed Paxlovid treatment with patient.  Patient declined Paxlovid treatment with shared decision making.  Will treat symptoms with prednisone taper to decrease inflammation and help with cough.  Benzonatate also prescribed as needed for cough.  Discussed over-the-counter medications to alleviate symptoms with patient.  Fever monitoring and management discussed with patient.  Advised patient to go to the hospital if shortness of breath or chest pain develop.Discussed strict return precautions. Patient verbalized understanding and is agreeable with plan.  Final Clinical Impressions(s) / UC Diagnoses   Final diagnoses:  COVID-19  Cough  Sore throat  Fever, unspecified     Discharge Instructions      Important that you treat symptoms over-the-counter as well as taking prescription medications. 1. Take a daily allergy pill/anti-histamine like Zyrtec, Claritin, or Store brand consistently for 2 weeks  2. For congestion you may try an oral decongestant like Mucinex or sudafed. You may also try intranasal flonase nasal spray or saline irrigations (neti pot, sinus cleanse)  3. For your sore throat you may try cepacol lozenges, salt water gargles, throat spray. Treatment of congestion may also help your sore throat.  4. For cough you may try Robitussen, Mucinex DM  5. Take Tylenol or Ibuprofen to help with pain/inflammation  6. Stay hydrated, drink plenty of fluids to keep throat coated and less irritated  Honey Tea For cough/sore throat try using a honey-based tea. Use 3 teaspoons of honey with juice squeezed from half lemon. Place shaved pieces of ginger into 1/2-1 cup of water and warm over stove top. Then mix the ingredients and repeat every 4 hours as needed.      ED Prescriptions     Medication Sig Dispense Auth. Provider   predniSONE (STERAPRED UNI-PAK  21 TAB) 10 MG (21) TBPK tablet Take by mouth daily. Take 6 tabs by mouth daily  for 2 days, then 5 tabs for 2 days, then 4 tabs for 2 days, then 3 tabs for 2 days, 2 tabs for 2 days, then 1 tab by mouth daily for 2 days 42 tablet Odis Luster, FNP   benzonatate (TESSALON) 100 MG capsule Take 1 capsule (100 mg total) by mouth every 8 (eight) hours as needed for cough. 21 capsule Odis Luster, FNP      PDMP not reviewed this encounter.   Odis Luster, FNP 11/23/20 1743

## 2020-11-26 LAB — CULTURE, GROUP A STREP (THRC)

## 2020-12-13 ENCOUNTER — Ambulatory Visit (HOSPITAL_BASED_OUTPATIENT_CLINIC_OR_DEPARTMENT_OTHER): Payer: Medicare Other | Admitting: Family Medicine

## 2020-12-21 DIAGNOSIS — Z20822 Contact with and (suspected) exposure to covid-19: Secondary | ICD-10-CM | POA: Diagnosis not present

## 2020-12-25 DIAGNOSIS — Z20822 Contact with and (suspected) exposure to covid-19: Secondary | ICD-10-CM | POA: Diagnosis not present

## 2021-01-23 ENCOUNTER — Other Ambulatory Visit: Payer: Self-pay

## 2021-01-23 ENCOUNTER — Encounter (HOSPITAL_BASED_OUTPATIENT_CLINIC_OR_DEPARTMENT_OTHER): Payer: Self-pay | Admitting: Family Medicine

## 2021-01-23 ENCOUNTER — Ambulatory Visit (INDEPENDENT_AMBULATORY_CARE_PROVIDER_SITE_OTHER): Payer: Medicare Other | Admitting: Family Medicine

## 2021-01-23 VITALS — BP 140/82 | HR 87 | Ht 62.0 in | Wt 174.6 lb

## 2021-01-23 DIAGNOSIS — I10 Essential (primary) hypertension: Secondary | ICD-10-CM

## 2021-01-23 DIAGNOSIS — E669 Obesity, unspecified: Secondary | ICD-10-CM | POA: Diagnosis not present

## 2021-01-23 DIAGNOSIS — Z600 Problems of adjustment to life-cycle transitions: Secondary | ICD-10-CM | POA: Diagnosis not present

## 2021-01-23 DIAGNOSIS — R7309 Other abnormal glucose: Secondary | ICD-10-CM | POA: Diagnosis not present

## 2021-01-23 DIAGNOSIS — E785 Hyperlipidemia, unspecified: Secondary | ICD-10-CM | POA: Diagnosis not present

## 2021-01-23 NOTE — Patient Instructions (Signed)
  Medication Instructions:  Your physician recommends that you continue on your current medications as directed. Please refer to the Current Medication list given to you today. --If you need a refill on any your medications before your next appointment, please call your pharmacy first. If no refills are authorized on file call the office.-- Lab Work: Your physician has recommended that you have lab work today: CBC,CMP,Lipid Profile, A1C, and TSH If you have labs (blood work) drawn today and your tests are completely normal, you will receive your results via [facility] a phone call from our staff.  Please ensure you check your voicemail in the event that you authorized detailed messages to be left on a delegated number. If you have any lab test that is abnormal or we need to change your treatment, we will call you to review the results.  Follow-Up: Your next appointment:   Your physician recommends that you schedule a follow-up appointment in: 2 MONTHS with Dr. [name]  You will receive a text message or e-mail with a link to a survey about your care and experience with [place] today! We would greatly appreciate your feedback!   Thanks for letting us be apart of your health journey!!  Primary Care and Sports Medicine   Dr. Arlina Robes [place]   We encourage you to activate your patient portal called "MyChart".  Sign up information is provided on this After Visit Summary.  MyChart is used to connect with patients for Virtual Visits (Telemedicine).  Patients are able to view lab/test results, encounter notes, upcoming appointments, etc.  Non-urgent messages can be sent to your provider as well. To learn more about what you can do with MyChart, please visit --  [email or web address].
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
## 2021-01-23 NOTE — Assessment & Plan Note (Signed)
Elevated BMI, discussed lifestyle modifications Check labs as below

## 2021-01-23 NOTE — Assessment & Plan Note (Addendum)
Blood pressure at goal in office today Continue with home monitoring, continue with lifestyle modifications Recommend DASH diet Continue follow-up with cardiology Check labs as below today

## 2021-01-23 NOTE — Assessment & Plan Note (Signed)
Has not been taking statin medication for about 3 months now We will check lipid panel today Discussed calculation of ASCVD risk score, will calculate once updated labs obtained and discuss role of statin therapy Recommend lifestyle modifications, particular dietary

## 2021-01-24 ENCOUNTER — Encounter (HOSPITAL_BASED_OUTPATIENT_CLINIC_OR_DEPARTMENT_OTHER): Payer: Self-pay | Admitting: Family Medicine

## 2021-01-24 LAB — COMPREHENSIVE METABOLIC PANEL
ALT: 24 IU/L (ref 0–32)
AST: 22 IU/L (ref 0–40)
Albumin/Globulin Ratio: 1.8 (ref 1.2–2.2)
Albumin: 4.5 g/dL (ref 3.8–4.8)
Alkaline Phosphatase: 94 IU/L (ref 44–121)
BUN/Creatinine Ratio: 12 (ref 12–28)
BUN: 10 mg/dL (ref 8–27)
Bilirubin Total: 0.2 mg/dL (ref 0.0–1.2)
CO2: 21 mmol/L (ref 20–29)
Calcium: 9.7 mg/dL (ref 8.7–10.3)
Chloride: 102 mmol/L (ref 96–106)
Creatinine, Ser: 0.81 mg/dL (ref 0.57–1.00)
Globulin, Total: 2.5 g/dL (ref 1.5–4.5)
Glucose: 113 mg/dL — ABNORMAL HIGH (ref 70–99)
Potassium: 4.2 mmol/L (ref 3.5–5.2)
Sodium: 139 mmol/L (ref 134–144)
Total Protein: 7 g/dL (ref 6.0–8.5)
eGFR: 79 mL/min/{1.73_m2} (ref >59)

## 2021-01-24 LAB — CBC WITH DIFFERENTIAL/PLATELET
Basophils Absolute: 0.1 10*3/uL (ref 0.0–0.2)
Basos: 2 %
EOS (ABSOLUTE): 0.2 10*3/uL (ref 0.0–0.4)
Eos: 4 %
Hematocrit: 44.1 % (ref 34.0–46.6)
Hemoglobin: 14 g/dL (ref 11.1–15.9)
Immature Grans (Abs): 0 10*3/uL (ref 0.0–0.1)
Immature Granulocytes: 1 %
Lymphocytes Absolute: 2.3 10*3/uL (ref 0.7–3.1)
Lymphs: 35 %
MCH: 27.1 pg (ref 26.6–33.0)
MCHC: 31.7 g/dL (ref 31.5–35.7)
MCV: 86 fL (ref 79–97)
Monocytes Absolute: 0.5 10*3/uL (ref 0.1–0.9)
Monocytes: 8 %
Neutrophils Absolute: 3.4 10*3/uL (ref 1.4–7.0)
Neutrophils: 50 %
Platelets: 302 10*3/uL (ref 150–450)
RBC: 5.16 x10E6/uL (ref 3.77–5.28)
RDW: 14 % (ref 11.7–15.4)
WBC: 6.6 10*3/uL (ref 3.4–10.8)

## 2021-01-24 LAB — LIPID PANEL
Chol/HDL Ratio: 7.1 ratio — ABNORMAL HIGH (ref 0.0–4.4)
Cholesterol, Total: 257 mg/dL — ABNORMAL HIGH (ref 100–199)
HDL: 36 mg/dL — ABNORMAL LOW (ref >39)
LDL Chol Calc (NIH): 135 mg/dL — ABNORMAL HIGH (ref 0–99)
Triglycerides: 472 mg/dL — ABNORMAL HIGH (ref 0–149)
VLDL Cholesterol Cal: 86 mg/dL — ABNORMAL HIGH (ref 5–40)

## 2021-01-24 LAB — HEMOGLOBIN A1C
Est. average glucose Bld gHb Est-mCnc: 137 mg/dL
Hgb A1c MFr Bld: 6.4 % — ABNORMAL HIGH (ref 4.8–5.6)

## 2021-01-24 LAB — TSH RFX ON ABNORMAL TO FREE T4: TSH: 0.803 u[IU]/mL (ref 0.450–4.500)

## 2021-01-30 ENCOUNTER — Telehealth (HOSPITAL_BASED_OUTPATIENT_CLINIC_OR_DEPARTMENT_OTHER): Payer: Self-pay

## 2021-01-30 NOTE — Telephone Encounter (Signed)
-----   Message from [name] [name], MD sent at 01/24/2021  8:02 AM EDT ----- Dema Severin blood cell and red blood cell counts are normal with normal hemoglobin.  Electrolytes, kidney function and liver function are all normal.  Thyroid function is normal.  Hemoglobin A1c which measures the average blood sugar over the past 3 months is elevated at 6.4%, this falls within prediabetes range, indicating increased risk of developing diabetes in the future. Primary recommendations are for lifestyle modifications including dietary changes and increase in weekly physical activity.  Lipid panel shows elevated total cholesterol with elevated "bad" cholesterol and low "good" cholesterol.  The triglycerides are also notably elevated.  As we discussed in the office related to calculating 10-year risk of stroke or heart attack, this score was found to be 12.1% with current information.  This would suggest benefit with use of statin to help lower cholesterol and decrease risk of cardiovascular event.

## 2021-01-30 NOTE — Telephone Encounter (Signed)
Patient is aware and agreeable to results and recommendations Patient is apprehensive or restarting statin therapy as she has had statin intolerances in the past She is open to alternative therapy that doesn't induce the same side effects  Patient is amendable to increasing physical activity as well as changing eating hab

## 2021-02-01 ENCOUNTER — Ambulatory Visit: Payer: Medicare Other | Admitting: Cardiology

## 2021-02-28 DIAGNOSIS — H2513 Age-related nuclear cataract, bilateral: Secondary | ICD-10-CM | POA: Diagnosis not present

## 2021-02-28 DIAGNOSIS — H33311 Horseshoe tear of retina without detachment, right eye: Secondary | ICD-10-CM | POA: Diagnosis not present

## 2021-03-21 ENCOUNTER — Telehealth: Payer: Self-pay | Admitting: Cardiology

## 2021-03-21 NOTE — Telephone Encounter (Signed)
Spoke with pt who is asking if she should have lipid checked prior to seeing Dr [name].  Advised we typically check lipid once yearly and she just had her drawn in October.  She has been off of her medications at that time.  She reports has restarted them about 1 week ago.  Advised timeline for repeat to be determined at upcoming office visit 12/1 with Dr [name].  She states understanding.

## 2021-03-21 NOTE — Telephone Encounter (Signed)
  Pt would like to know if she needs to have her lipids check before seeing Dr. [name]

## 2021-03-23 ENCOUNTER — Encounter: Payer: Self-pay | Admitting: Cardiology

## 2021-03-23 ENCOUNTER — Other Ambulatory Visit: Payer: Self-pay

## 2021-03-23 ENCOUNTER — Ambulatory Visit (INDEPENDENT_AMBULATORY_CARE_PROVIDER_SITE_OTHER): Payer: Medicare Other | Admitting: Cardiology

## 2021-03-23 DIAGNOSIS — G453 Amaurosis fugax: Secondary | ICD-10-CM | POA: Diagnosis not present

## 2021-03-23 DIAGNOSIS — M79605 Pain in left leg: Secondary | ICD-10-CM

## 2021-03-23 DIAGNOSIS — R072 Precordial pain: Secondary | ICD-10-CM

## 2021-03-23 DIAGNOSIS — I6521 Occlusion and stenosis of right carotid artery: Secondary | ICD-10-CM | POA: Diagnosis not present

## 2021-03-23 DIAGNOSIS — M79604 Pain in right leg: Secondary | ICD-10-CM | POA: Diagnosis not present

## 2021-03-23 DIAGNOSIS — I10 Essential (primary) hypertension: Secondary | ICD-10-CM

## 2021-03-23 DIAGNOSIS — E782 Mixed hyperlipidemia: Secondary | ICD-10-CM

## 2021-03-23 DIAGNOSIS — M79606 Pain in leg, unspecified: Secondary | ICD-10-CM | POA: Insufficient documentation

## 2021-03-23 NOTE — Assessment & Plan Note (Signed)
Mild previously seen.  Continue to encourage statin use.  Aspirin 81 mg would be reasonable as well.

## 2021-03-23 NOTE — Progress Notes (Signed)
Cardiology Office Note:    Date:  03/23/2021   ID:  Brittney Williamson, DOB 02/20/1951, MRN [id number]  PCP:  [name], [name], MD  [facility] Cardiologist:  Candee Furbish, MD  [facility] Electrophysiologist:  None   Referring MD: [name], [name], MD     History of Present Illness:    Brittney Williamson is a 70 y.o. female here for hypertension and lipid follow-up.  Has worked for [employer] different geographical locations.  Used to work for [employer] at [place].  Prior amaurosis fugax of right eye.  Overall been doing quite well.   Today,  She says she has been well, but adds that she has been having right leg cramping (from her tendon area) all the way to her foot and has also felt tingling in her toes. She reports that she currently has not been taking her statins.   She likes to have sugar in her diet but has currently been reducing her sugar intake. Her favorite is chocolate is dark chocolate. She states that she was told that she may have pre-diabetes.  She gets palpitations sometimes and believes it could be from indigestion. She notes that her leg pain is worse than her heart burn. She purchased a bottle of Omega-369 supplements.  She found her sisters recently and reports that they also have to take medications as well.  She denies any chest pain. No lightheadedness, headaches, syncope, orthopnea, PND, lower extremity edema or exertional symptoms.    Past Medical History:  Diagnosis Date   Mitral valve prolapse 1993   OSA on CPAP     Past Surgical History:  Procedure Laterality Date   KNEE ARTHROSCOPY WITH MEDIAL MENISECTOMY Left 05/27/2019   WRIST SURGERY Right 2017    Current Medications: Current Meds  Medication Sig   co-enzyme Q-10 50 MG capsule Take 50 mg by mouth daily.   ELDERBERRY PO Take 2 tablets by mouth daily.   magnesium 30 MG tablet Take 30 mg by mouth 2 (two) times daily.   melatonin 3 MG TABS tablet Take 5 mg by mouth  at bedtime.   Omega-3 1000 MG CAPS Take by mouth.   rosuvastatin (CRESTOR) 10 MG tablet Take 10 mg by mouth at bedtime.   vitamin B-12 (CYANOCOBALAMIN) 1000 MCG tablet Take 1,000 mcg by mouth daily.   VITAMIN D PO Take 20,000 Units by mouth daily.    Zinc Gluconate 30 MG TABS Take by mouth.     Allergies:   Penicillins, Statins, and Trimox [amoxicillin trihydrate]   Social History   Socioeconomic History   Marital status: Single    Spouse name: Not on file   Number of children: Not on file   Years of education: Not on file   Highest education level: Not on file  Occupational History   Not on file  Tobacco Use   Smoking status: Never   Smokeless tobacco: Never  Substance and Sexual Activity   Alcohol use: Yes    Comment: occ   Drug use: Never   Sexual activity: Not Currently  Other Topics Concern   Not on file  Social History Narrative   Not on file   Social Determinants of Health   Financial Resource Strain: Not on file  Food Insecurity: Not on file  Transportation Needs: Not on file  Physical Activity: Not on file  Stress: Not on file  Social Connections: Not on file     Family History:  The patient's family history is not on file. She was adopted.  ROS:   Please see the history of present illness.    (+) Right LE angina (near her tendon area) (+) Palpitations  All other systems reviewed and are negative.  EKGs/Labs/Other Studies Reviewed:    EKG: EKG is personally reviewed and interpreted.   03/23/2021: Sinus Rhythm, Rate 70 bpm  The following studies were reviewed today:  ECHO 09/07/19    1. Normal LV systolic function; grade 1 diastolic dysfunction; mild LVH;  trace MR and TR.   2. Left ventricular ejection fraction, by estimation, is 60 to 65%. The  left ventricle has normal function. The left ventricle has no regional  wall motion abnormalities. There is mild left ventricular hypertrophy.  Left ventricular diastolic parameters  are consistent  with Grade I diastolic dysfunction (impaired relaxation).   3. Right ventricular systolic function is normal. The right ventricular  size is normal. Tricuspid regurgitation signal is inadequate for assessing  PA pressure.   4. The mitral valve is normal in structure. Trivial mitral valve  regurgitation. No evidence of mitral stenosis.   5. The aortic valve is tricuspid. Aortic valve regurgitation is not  visualized. Mild aortic valve sclerosis is present, with no evidence of  aortic valve stenosis.   6. The inferior vena cava is normal in size with greater than 50%  respiratory variability, suggesting right atrial pressure of 3 mmHg.    Recent Labs: 01/23/2021: ALT 24; BUN 10; Creatinine, Ser 0.81; Hemoglobin 14.0; Platelets 302; Potassium 4.2; Sodium 139; TSH 0.803  Recent Lipid Panel    Component Value Date/Time   CHOL 257 (H) 01/23/2021 1629   TRIG 472 (H) 01/23/2021 1629   HDL 36 (L) 01/23/2021 1629   CHOLHDL 7.1 (H) 01/23/2021 1629   CHOLHDL 6 04/14/2018 0919   VLDL 67.8 (H) 04/14/2018 0919   LDLCALC 135 (H) 01/23/2021 1629   LDLDIRECT 78 11/06/2019 0832   LDLDIRECT 57.0 04/14/2018 0919     Risk Assessment/Calculations:      Physical Exam:    VS:  BP 140/80 (BP Location: Left Arm, Patient Position: Sitting, Cuff Size: Normal)   Pulse 70   Ht 5\' 2"  (1.575 m)   Wt 173 lb (78.5 kg)   SpO2 95%   BMI 31.64 kg/m     Wt Readings from Last 3 Encounters:  03/23/21 173 lb (78.5 kg)  01/23/21 174 lb 9.6 oz (79.2 kg)  06/02/20 180 lb (81.6 kg)     Physical Exam:    VS:  BP 140/80 (BP Location: Left Arm, Patient Position: Sitting, Cuff Size: Normal)   Pulse 70   Ht 5\' 2"  (1.575 m)   Wt 173 lb (78.5 kg)   SpO2 95%   BMI 31.64 kg/m     Wt Readings from Last 3 Encounters:  03/23/21 173 lb (78.5 kg)  01/23/21 174 lb 9.6 oz (79.2 kg)  06/02/20 180 lb (81.6 kg)     GEN: Well nourished, well developed in no acute distress HEENT: Normal NECK: No JVD; No carotid  bruits LYMPHATICS: No lymphadenopathy CARDIAC: RRR, no murmurs, rubs, gallops RESPIRATORY:  Clear to auscultation without rales, wheezing or rhonchi  ABDOMEN: Soft, non-tender, non-distended MUSCULOSKELETAL:  No edema; No deformity  SKIN: Warm and dry NEUROLOGIC:  Alert and oriented x 3 PSYCHIATRIC:  Normal affect   ASSESSMENT:    1. Essential hypertension   2. Mixed hyperlipidemia   3. Carotid artery plaque, right   4. Pain in  both lower extremities   5. Precordial pain   6. Amaurosis fugax     PLAN:    Essential hypertension Monitoring home blood pressures.  Overall doing quite well.  Anxiety sometimes increases.  Currently on a few supplements as noted as above.  Continue to monitor.  Mixed hyperlipidemia LDL previously went down from 1 93-64 with Crestor.  Triglycerides also decreased from 450 down to 179 with use of Tricor.  I do not see Tricor on her list anymore.  She is taking omega-3 fatty acids.  She admits that she is not taking the medications at this point.  Her lipid panel confirms this.  I will go ahead and get a coronary calcium score.  Obviously if there is coronary calcium present this would even be more evidence for the need for statin therapy.  Also given her high triglycerides I would also like for her to be on Lovaza.  After the CT scan we can discuss this further.  In 6 months I will have her follow-up with an APP to monitor her progress.    Carotid artery plaque, right Mild previously seen.  Continue to encourage statin use.  Aspirin 81 mg would be reasonable as well.  Leg pain We will go ahead and check lower extremity vascular ultrasound to ensure that there is no evidence of blood flow issues.  Difficult to obtain distal pulses.  Amaurosis fugax Prior transient eye blindness.  Now doing well.   In order of problems listed above:      F/U in 6 months with APP   Medication Adjustments/Labs and Tests Ordered: Current medicines are reviewed at  length with the patient today.  Concerns regarding medicines are outlined above.  Orders Placed This Encounter  Procedures   CT CARDIAC SCORING (SELF PAY ONLY)   EKG 12-Lead   VAS [place] ABI WITH/WO TBI   VAS [place] LOWER EXTREMITY ARTERIAL DUPLEX    No orders of the defined types were placed in this encounter.  Patient Instructions  Medication Instructions:  The current medical regimen is effective;  continue present plan and medications.  *If you need a refill on your cardiac medications before your next appointment, please call your pharmacy*  Testing/Procedures:  You physician has ordered for you to have a Calcium score which is completed by  CT scanning, (CAT scanning), is a noninvasive, special x-ray that produces cross-sectional images of the body using x-rays and a computer. CT scans provide greater clarity and reveal more details than regular x-ray exams.  This testing is completed here at our [place] office.  There are no restrictions.  The cost of $99 is due at the time of the test.  Your physician has requested that you have a lower extremity arterial exercise duplex. During this test, exercise and ultrasound are used to evaluate arterial blood flow in the legs. Allow one hour for this exam. There are no restrictions or special instructions.  This is completed at our [facility] office.  Follow-Up: At [facility], you and your health needs are our priority.  As part of our continuing mission to provide you with exceptional heart care, we have created designated Provider Care Teams.  These Care Teams include your primary Cardiologist (physician) and Advanced Practice Providers (APPs -  Physician Assistants and Nurse Practitioners) who all work together to provide you with the care you need, when you need it.  We recommend signing up for the patient portal called "MyChart".  Sign up  information is provided on this After Visit Summary.  MyChart is used to connect with  patients for Virtual Visits (Telemedicine).  Patients are able to view lab/test results, encounter notes, upcoming appointments, etc.  Non-urgent messages can be sent to your provider as well.   To learn more about what you can do with MyChart, go to [email or web address].    Your next appointment:   6 month(s)  The format for your next appointment:   In Person  Provider:   Robbie Lis, PA-C, Melina Copa, PA-C, Cecilie Kicks, NP, Ermalinda Barrios, PA-C, Christen Bame, NP, or Richardson Dopp, PA-C        Thank you for choosing [facility]!!      Rondell Reams as a scribe for Candee Furbish, MD.,have documented all relevant documentation on the behalf of Candee Furbish, MD,as directed by  Candee Furbish, MD while in the presence of Candee Furbish, MD.   I, Candee Furbish, MD, have reviewed all documentation for this visit. The documentation on 03/23/21 for the exam, diagnosis, procedures, and orders are all accurate and complete.   Signed, Candee Furbish, MD  03/23/2021 11:03 AM    [place]

## 2021-03-23 NOTE — Assessment & Plan Note (Addendum)
LDL previously went down from 1 93-64 with Crestor.  Triglycerides also decreased from 450 down to 179 with use of Tricor.  I do not see Tricor on her list anymore.  She is taking omega-3 fatty acids.  She admits that she is not taking the medications at this point.  Her lipid panel confirms this.  I will go ahead and get a coronary calcium score.  Obviously if there is coronary calcium present this would even be more evidence for the need for statin therapy.  Also given her high triglycerides I would also like for her to be on Lovaza.  After the CT scan we can discuss this further.  In 6 months I will have her follow-up with an APP to monitor her progress.

## 2021-03-23 NOTE — Assessment & Plan Note (Signed)
Monitoring home blood pressures.  Overall doing quite well.  Anxiety sometimes increases.  Currently on a few supplements as noted as above.  Continue to monitor.

## 2021-03-23 NOTE — Patient Instructions (Signed)
Medication Instructions:  The current medical regimen is effective;  continue present plan and medications.  *If you need a refill on your cardiac medications before your next appointment, please call your pharmacy*  Testing/Procedures:  You physician has ordered for you to have a Calcium score which is completed by  CT scanning, (CAT scanning), is a noninvasive, special x-ray that produces cross-sectional images of the body using x-rays and a computer. CT scans provide greater clarity and reveal more details than regular x-ray exams.  This testing is completed here at our [place] office.  There are no restrictions.  The cost of $99 is due at the time of the test.  Your physician has requested that you have a lower extremity arterial exercise duplex. During this test, exercise and ultrasound are used to evaluate arterial blood flow in the legs. Allow one hour for this exam. There are no restrictions or special instructions.  This is completed at our [facility] office.  Follow-Up: At [facility], you and your health needs are our priority.  As part of our continuing mission to provide you with exceptional heart care, we have created designated Provider Care Teams.  These Care Teams include your primary Cardiologist (physician) and Advanced Practice Providers (APPs -  Physician Assistants and Nurse Practitioners) who all work together to provide you with the care you need, when you need it.  We recommend signing up for the patient portal called "MyChart".  Sign up information is provided on this After Visit Summary.  MyChart is used to connect with patients for Virtual Visits (Telemedicine).  Patients are able to view lab/test results, encounter notes, upcoming appointments, etc.  Non-urgent messages can be sent to your provider as well.   To learn more about what you can do with MyChart, go to [email or web address].    Your next appointment:   6 month(s)  The format for your  next appointment:   In Person  Provider:   Robbie Lis, PA-C, Melina Copa, PA-C, Cecilie Kicks, NP, Ermalinda Barrios, PA-C, Christen Bame, NP, or Richardson Dopp, PA-C        Thank you for choosing [facility]!!

## 2021-03-23 NOTE — Assessment & Plan Note (Signed)
Prior transient eye blindness.  Now doing well.

## 2021-03-23 NOTE — Assessment & Plan Note (Signed)
We will go ahead and check lower extremity vascular ultrasound to ensure that there is no evidence of blood flow issues.  Difficult to obtain distal pulses.

## 2021-03-24 ENCOUNTER — Other Ambulatory Visit: Payer: Self-pay | Admitting: Cardiology

## 2021-03-24 DIAGNOSIS — R0989 Other specified symptoms and signs involving the circulatory and respiratory systems: Secondary | ICD-10-CM

## 2021-03-24 DIAGNOSIS — M79604 Pain in right leg: Secondary | ICD-10-CM

## 2021-03-27 ENCOUNTER — Ambulatory Visit (HOSPITAL_BASED_OUTPATIENT_CLINIC_OR_DEPARTMENT_OTHER): Payer: Medicare Other | Admitting: Family Medicine

## 2021-03-30 ENCOUNTER — Other Ambulatory Visit: Payer: Self-pay

## 2021-03-30 ENCOUNTER — Ambulatory Visit (INDEPENDENT_AMBULATORY_CARE_PROVIDER_SITE_OTHER): Payer: Medicare Other | Admitting: Family Medicine

## 2021-03-30 ENCOUNTER — Encounter (HOSPITAL_BASED_OUTPATIENT_CLINIC_OR_DEPARTMENT_OTHER): Payer: Self-pay | Admitting: Family Medicine

## 2021-03-30 VITALS — BP 128/84 | HR 74 | Ht 62.0 in | Wt 171.0 lb

## 2021-03-30 DIAGNOSIS — M79604 Pain in right leg: Secondary | ICD-10-CM | POA: Diagnosis not present

## 2021-03-30 DIAGNOSIS — E669 Obesity, unspecified: Secondary | ICD-10-CM | POA: Diagnosis not present

## 2021-03-30 DIAGNOSIS — M79605 Pain in left leg: Secondary | ICD-10-CM | POA: Diagnosis not present

## 2021-03-30 DIAGNOSIS — E782 Mixed hyperlipidemia: Secondary | ICD-10-CM

## 2021-03-30 NOTE — Assessment & Plan Note (Signed)
Had stopped taking rosuvastatin at 1 point as well as her fenofibrate, reports that she has resumed taking these Recently met with cardiology and plans to have coronary artery calcium score completed, scan is scheduled for later this month She has some concerns related to statin therapy and some leg pains/myalgias Discussed role of statin therapy in regards to cholesterol management, prevention of adverse cardiovascular events

## 2021-03-30 NOTE — Progress Notes (Signed)
    Procedures performed today:    None.  Independent interpretation of notes and tests performed by another provider:   None.  Brief History, Exam, Impression, and Recommendations:    BP 128/84   Pulse 74   Ht _0  (1.575 m)   Wt 171 lb (77.6 kg)   SpO2 99%   BMI 31.28 kg/m   Mixed hyperlipidemia Had stopped taking rosuvastatin at 1 point as well as her fenofibrate, reports that she has resumed taking these Recently met with cardiology and plans to have coronary artery calcium score completed, scan is scheduled for later this month She has some concerns related to statin therapy and some leg pains/myalgias Discussed role of statin therapy in regards to cholesterol management, prevention of adverse cardiovascular events  Obesity, Class I, BMI 30-34.9 Discussed general measures today related to lifestyle modifications Discussed optimal weight goal as well as setting short-term, realistic weight loss goals Discussed that there is evidence to support BMI that is slightly above normal range in older age Encouraged to gradually increase weekly physical activity.  Patient will be retiring next week from her current job and thus will have more spare time during the week  Leg pain Has further testing regarding this arranged through cardiology, encouraged to continue with his  Has been having some sleep issues recently which she attributes to having a new puppy at home that will wake her up during the night.  Hopefully with change in job status, she will be able to spend more time with the puppy during the day and will be able to obtain more complete nights of rest  Spent 30 minutes on this patient encounter, including preparation, chart review, face-to-face counseling with patient and coordination of care, and documentation of encounter  Plan for follow-up in about 3 to 4 months or sooner as needed   ___________________________________________ Deone Omahoney [name], MD, ABFM,  [facility] Primary [facility] and [place]

## 2021-03-30 NOTE — Assessment & Plan Note (Signed)
Has further testing regarding this arranged through cardiology, encouraged to continue with his

## 2021-03-30 NOTE — Assessment & Plan Note (Signed)
Discussed general measures today related to lifestyle modifications Discussed optimal weight goal as well as setting short-term, realistic weight loss goals Discussed that there is evidence to support BMI that is slightly above normal range in older age Encouraged to gradually increase weekly physical activity.  Patient will be retiring next week from her current job and thus will have more spare time during the week

## 2021-03-30 NOTE — Patient Instructions (Signed)
  Medication Instructions:  Your physician recommends that you continue on your current medications as directed. Please refer to the Current Medication list given to you today. --If you need a refill on any your medications before your next appointment, please call your pharmacy first. If no refills are authorized on file call the office.-- Follow-Up: Your next appointment:   Your physician recommends that you schedule a follow-up appointment in: 3-4 MONTHS with Dr. [name]  You will receive a text message or e-mail with a link to a survey about your care and experience with [place] today! We would greatly appreciate your feedback!   Thanks for letting us be apart of your health journey!!  Primary Care and Sports Medicine   Dr. Arlina Robes [place]   We encourage you to activate your patient portal called "MyChart".  Sign up information is provided on this After Visit Summary.  MyChart is used to connect with patients for Virtual Visits (Telemedicine).  Patients are able to view lab/test results, encounter notes, upcoming appointments, etc.  Non-urgent messages can be sent to your provider as well. To learn more about what you can do with MyChart, please visit --  [email or web address].

## 2021-04-05 ENCOUNTER — Other Ambulatory Visit: Payer: Self-pay

## 2021-04-05 ENCOUNTER — Ambulatory Visit (HOSPITAL_COMMUNITY)
Admission: RE | Admit: 2021-04-05 | Discharge: 2021-04-05 | Disposition: A | Discharge status: Home / Self Care | Payer: Medicare Other | Source: Ambulatory Visit | Attending: Cardiology | Admitting: Cardiology

## 2021-04-05 DIAGNOSIS — M79604 Pain in right leg: Secondary | ICD-10-CM | POA: Diagnosis not present

## 2021-04-05 DIAGNOSIS — M79605 Pain in left leg: Secondary | ICD-10-CM

## 2021-04-05 DIAGNOSIS — R0989 Other specified symptoms and signs involving the circulatory and respiratory systems: Secondary | ICD-10-CM

## 2021-04-10 ENCOUNTER — Telehealth: Payer: Self-pay | Admitting: *Deleted

## 2021-04-10 DIAGNOSIS — R6889 Other general symptoms and signs: Secondary | ICD-10-CM

## 2021-04-10 DIAGNOSIS — M79604 Pain in right leg: Secondary | ICD-10-CM

## 2021-04-10 NOTE — Telephone Encounter (Signed)
Abnormal blood flow through both legs.  Let's have her set up to see [facility] for further evaluation and treatment  Candee Furbish, MD   Summary:  Right: Resting right ankle-brachial index indicates moderate right lower extremity arterial disease. The right toe-brachial index is abnormal.   Left: Resting left ankle-brachial index indicates mild left lower extremity arterial disease. The left toe-brachial index is abnormal.   Summary:  Right: Total occlusion noted in the popliteal artery. Total occlusion  noted in the tibio-peroneal trunk. The proximal peroneal artery apperas  occluded with reconstituted flow distally. Occlusion of the posterior and  anterior tibial arteries.   Left: No signficant femoro-popliteal disease. Segmental occlusion of the  posterior tibial artery. Total occlusion of the anterior tibial artery.  Order placed for [facility] per Dr [name].

## 2021-04-18 ENCOUNTER — Ambulatory Visit (INDEPENDENT_AMBULATORY_CARE_PROVIDER_SITE_OTHER): Payer: Medicare Other | Admitting: Vascular Surgery

## 2021-04-18 ENCOUNTER — Other Ambulatory Visit: Payer: Self-pay

## 2021-04-18 ENCOUNTER — Encounter: Payer: Self-pay | Admitting: Vascular Surgery

## 2021-04-18 DIAGNOSIS — I70211 Atherosclerosis of native arteries of extremities with intermittent claudication, right leg: Secondary | ICD-10-CM | POA: Diagnosis not present

## 2021-04-18 DIAGNOSIS — I70219 Atherosclerosis of native arteries of extremities with intermittent claudication, unspecified extremity: Secondary | ICD-10-CM | POA: Insufficient documentation

## 2021-04-18 MED ORDER — CILOSTAZOL 100 MG PO TABS: 100.0000 mg | ORAL_TABLET | Freq: Two times a day (BID) | ORAL | 11 refills | Status: DC

## 2021-04-18 NOTE — Progress Notes (Signed)
Patient name: Brittney Williamson MRN: [id number] DOB: 08-11-1950 Sex: female  REASON FOR CONSULT: Evaluate lower extremity pain with abnormal ABIs  HPI: Brittney Williamson is a 70 y.o. female, with history of hypertension that presents for evaluation of lower extremity pain with abnormal ABIs.  She states this started about 1.5 years ago.  She has more severe symptoms in the right lower extremity.  She describes this as burning and a charley horse in the right calf when she walks.  No pain in the foot at night.  No nonhealing wounds.  She does feel like she has some milder left lower extremity symptoms due to compensating for the right leg.  States she can walk from here to the parking lot and that is about it and has to stop.  Symptoms have been about the same over the last 1.5 years.  Past Medical History:  Diagnosis Date   Mitral valve prolapse 1993   OSA on CPAP     Past Surgical History:  Procedure Laterality Date   KNEE ARTHROSCOPY WITH MEDIAL MENISECTOMY Left 05/27/2019   WRIST SURGERY Right 2017    Family History  Adopted: Yes    SOCIAL HISTORY: Social History   Socioeconomic History   Marital status: Single    Spouse name: Not on file   Number of children: Not on file   Years of education: Not on file   Highest education level: Not on file  Occupational History   Not on file  Tobacco Use   Smoking status: Never   Smokeless tobacco: Never  Substance and Sexual Activity   Alcohol use: Yes    Comment: occ   Drug use: Never   Sexual activity: Not Currently  Other Topics Concern   Not on file  Social History Narrative   Not on file   Social Determinants of Health   Financial Resource Strain: Not on file  Food Insecurity: Not on file  Transportation Needs: Not on file  Physical Activity: Not on file  Stress: Not on file  Social Connections: Not on file  Intimate Partner Violence: Not on file    Allergies  Allergen Reactions   Penicillins Itching    Statins Other (See Comments)    Myalgia and severe fatigue   Trimox [Amoxicillin Trihydrate] Itching    Current Outpatient Medications  Medication Sig Dispense Refill   cilostazol (PLETAL) 100 MG tablet Take 1 tablet (100 mg total) by mouth 2 (two) times daily. 60 tablet 11   co-enzyme Q-10 50 MG capsule Take 50 mg by mouth daily.     ELDERBERRY PO Take 2 tablets by mouth daily.     fenofibrate (TRICOR) 145 MG tablet Take 145 mg by mouth daily.     magnesium 30 MG tablet Take 30 mg by mouth 2 (two) times daily.     Omega-3 1000 MG CAPS Take by mouth.     rosuvastatin (CRESTOR) 10 MG tablet Take 10 mg by mouth at bedtime.     vitamin B-12 (CYANOCOBALAMIN) 1000 MCG tablet Take 1,000 mcg by mouth daily.     VITAMIN D PO Take 20,000 Units by mouth daily.      Zinc Gluconate 30 MG TABS Take by mouth.     No current facility-administered medications for this visit.    REVIEW OF SYSTEMS:  [X]  denotes positive finding, [ ]  denotes negative finding Cardiac  Comments:  Chest pain or chest pressure:    Shortness of breath upon exertion:  Short of breath when lying flat:    Irregular heart rhythm:        Vascular    Pain in calf, thigh, or hip brought on by ambulation: x Right calf  Pain in feet at night that wakes you up from your sleep:     Blood clot in your veins:    Leg swelling:         Pulmonary    Oxygen at home:    Productive cough:     Wheezing:         Neurologic    Sudden weakness in arms or legs:     Sudden numbness in arms or legs:     Sudden onset of difficulty speaking or slurred speech:    Temporary loss of vision in one eye:     Problems with dizziness:         Gastrointestinal    Blood in stool:     Vomited blood:         Genitourinary    Burning when urinating:     Blood in urine:        Psychiatric    Major depression:         Hematologic    Bleeding problems:    Problems with blood clotting too easily:        Skin    Rashes or ulcers:         Constitutional    Fever or chills:      PHYSICAL EXAM: Vitals:   04/18/21 1541  BP: 139/80  Pulse: 71  Resp: 14  Temp: 98.1 F (36.7 C)  TempSrc: Temporal  SpO2: 96%  Weight: 174 lb (78.9 kg)  Height: 5\' 2"  (1.575 m)    GENERAL: The patient is a well-nourished female, in no acute distress. The vital signs are documented above. CARDIAC: There is a regular rate and rhythm.  VASCULAR:  Palpable femoral pulses bilaterally No palpable pedal pulses No lower extremity tissue loss PULMONARY: No respiratory distress ABDOMEN: Soft and non-tender. MUSCULOSKELETAL: There are no major deformities or cyanosis. NEUROLOGIC: No focal weakness or paresthesias are detected. SKIN: There are no ulcers or rashes noted. PSYCHIATRIC: The patient has a normal affect.  DATA:   Lower extremity arterial duplex 04/05/2021 shows occlusion of the distal right popliteal artery and TP trunk.  Left lower extremity has a total occlusion of the anterior tibial and segmental occlusion of the posterior tibial.  ABIs 04/05/21 were 0.62 on the right dampened monophasic and 0.9 on the left triphasic through the peroneal  Assessment/Plan:  70 year old female presents for evaluation of right lower extremity claudication.  I discussed with her claudication is typically managed medically until it becomes lifestyle limiting.  I discussed the option of medical therapy versus intervention with arteriogram and possible angioplasty stenting if she has an endovascular option.  Ultimately she would like to try medical therapy first.  I have recommended she start an 81 mg aspirin and she is already on statin.  I prescribed her Pletal 100 mg twice daily.  Discussed walking therapies 30 minutes a day 3 times a week.  If she has progression of symptoms and no improvement we can plan arteriogram in the future.  Discussed we reserve surgical management of claudication when it becomes lifestyle limiting.  We will see how she improves  with medical therapy over the next 3 months. I will have her follow-up in 3 months.   Marty Heck, MD [facility] [place] Office: [phone]

## 2021-04-18 NOTE — Progress Notes (Deleted)
Patient name: Brittney Williamson MRN: [id number] DOB: [birth year] Sex: female  REASON FOR CONSULT: ***  HPI: Brittney Williamson is a 69 y.o. female, with hx HTN  Past Medical History:  Diagnosis Date   Mitral valve prolapse 1993   OSA on CPAP     Past Surgical History:  Procedure Laterality Date   KNEE ARTHROSCOPY WITH MEDIAL MENISECTOMY Left 05/27/2019   WRIST SURGERY Right 2017    Family History  Adopted: Yes    SOCIAL HISTORY: Social History   Socioeconomic History   Marital status: Single    Spouse name: Not on file   Number of children: Not on file   Years of education: Not on file   Highest education level: Not on file  Occupational History   Not on file  Tobacco Use   Smoking status: Never   Smokeless tobacco: Never  Substance and Sexual Activity   Alcohol use: Yes    Comment: occ   Drug use: Never   Sexual activity: Not Currently  Other Topics Concern   Not on file  Social History Narrative   Not on file   Social Determinants of Health   Financial Resource Strain: Not on file  Food Insecurity: Not on file  Transportation Needs: Not on file  Physical Activity: Not on file  Stress: Not on file  Social Connections: Not on file  Intimate Partner Violence: Not on file    Allergies  Allergen Reactions   Penicillins Itching   Statins Other (See Comments)    Myalgia and severe fatigue   Trimox [Amoxicillin Trihydrate] Itching    Current Outpatient Medications  Medication Sig Dispense Refill   co-enzyme Q-10 50 MG capsule Take 50 mg by mouth daily.     ELDERBERRY PO Take 2 tablets by mouth daily.     fenofibrate (TRICOR) 145 MG tablet Take 145 mg by mouth daily.     magnesium 30 MG tablet Take 30 mg by mouth 2 (two) times daily.     Omega-3 1000 MG CAPS Take by mouth.     rosuvastatin (CRESTOR) 10 MG tablet Take 10 mg by mouth at bedtime.     vitamin B-12 (CYANOCOBALAMIN) 1000 MCG tablet Take 1,000 mcg by mouth daily.     VITAMIN D PO Take  20,000 Units by mouth daily.      Zinc Gluconate 30 MG TABS Take by mouth.     No current facility-administered medications for this visit.    REVIEW OF SYSTEMS:  [X]  denotes positive finding, [ ]  denotes negative finding Cardiac  Comments:  Chest pain or chest pressure: ***   Shortness of breath upon exertion:    Short of breath when lying flat:    Irregular heart rhythm:        Vascular    Pain in calf, thigh, or hip brought on by ambulation:    Pain in feet at night that wakes you up from your sleep:     Blood clot in your veins:    Leg swelling:         Pulmonary    Oxygen at home:    Productive cough:     Wheezing:         Neurologic    Sudden weakness in arms or legs:     Sudden numbness in arms or legs:     Sudden onset of difficulty speaking or slurred speech:    Temporary loss of vision in one eye:  Problems with dizziness:         Gastrointestinal    Blood in stool:     Vomited blood:         Genitourinary    Burning when urinating:     Blood in urine:        Psychiatric    Major depression:         Hematologic    Bleeding problems:    Problems with blood clotting too easily:        Skin    Rashes or ulcers:        Constitutional    Fever or chills:      PHYSICAL EXAM: Vitals:   04/18/21 1541  BP: 139/80  Pulse: 71  Resp: 14  Temp: 98.1 F (36.7 C)  TempSrc: Temporal  SpO2: 96%  Weight: 174 lb (78.9 kg)  Height: 5\' 2"  (1.575 m)    GENERAL: The patient is a well-nourished female, in no acute distress. The vital signs are documented above. CARDIAC: There is a regular rate and rhythm.  VASCULAR: *** PULMONARY: There is good air exchange bilaterally without wheezing or rales. ABDOMEN: Soft and non-tender with normal pitched bowel sounds.  MUSCULOSKELETAL: There are no major deformities or cyanosis. NEUROLOGIC: No focal weakness or paresthesias are detected. SKIN: There are no ulcers or rashes noted. PSYCHIATRIC: The patient has a  normal affect.  DATA:   ABIs 04/05/21 0.62 on the right dampened monophasic in DP and 0.9 on the left triphasic in peroneal  Assessment/Plan:  ***   Marty Heck, MD [facility] Office: [phone]

## 2021-04-19 ENCOUNTER — Other Ambulatory Visit: Payer: Self-pay

## 2021-04-19 DIAGNOSIS — I70211 Atherosclerosis of native arteries of extremities with intermittent claudication, right leg: Secondary | ICD-10-CM

## 2021-04-21 ENCOUNTER — Other Ambulatory Visit: Payer: Self-pay

## 2021-04-21 ENCOUNTER — Ambulatory Visit (INDEPENDENT_AMBULATORY_CARE_PROVIDER_SITE_OTHER)
Admission: RE | Admit: 2021-04-21 | Discharge: 2021-04-21 | Disposition: A | Discharge status: Home / Self Care | Payer: Self-pay | Source: Ambulatory Visit | Attending: Cardiology | Admitting: Cardiology

## 2021-04-21 DIAGNOSIS — I6521 Occlusion and stenosis of right carotid artery: Secondary | ICD-10-CM

## 2021-04-21 DIAGNOSIS — E782 Mixed hyperlipidemia: Secondary | ICD-10-CM

## 2021-04-21 DIAGNOSIS — R072 Precordial pain: Secondary | ICD-10-CM

## 2021-04-21 DIAGNOSIS — I10 Essential (primary) hypertension: Secondary | ICD-10-CM

## 2021-04-28 ENCOUNTER — Encounter: Payer: Self-pay | Admitting: *Deleted

## 2021-05-03 ENCOUNTER — Telehealth: Payer: Self-pay | Admitting: Cardiology

## 2021-05-03 NOTE — Telephone Encounter (Signed)
Pt is needing to speak w/ nurse Pam please advise

## 2021-05-03 NOTE — Telephone Encounter (Signed)
Pt had received the letter of coronary calcium score results and questioned the meaning.  Advised a ca score of 0 means no coronary calcium, which is a good thing.  Pt states understanding.  She is also inquiring about upcoming dental work.  She is needing a bridge made and is asking if there are any medications she will need to hold.  Advised dentist doing procedure should sent a request for clearance in order to properly clear her however there are no medications on her current list that she would need to hold.  Pt states understanding and was appreciative of the call back and information.

## 2021-05-18 ENCOUNTER — Ambulatory Visit: Payer: Medicare Other | Admitting: Cardiology

## 2021-05-31 NOTE — Patient Instructions (Signed)

## 2021-05-31 NOTE — Progress Notes (Signed)
PATIENT: Brittney Williamson DOB: April 18, 1951  REASON FOR VISIT: follow up HISTORY FROM: patient  Chief Complaint  Patient presents with   Obstructive Sleep Apnea    Rm 1, alone. Here for yearly CPAP f/u. Pt has had a few nights where she was unable to keep CPAP on for more then 4hrs at night due to new puppy and cat.      HISTORY OF PRESENT ILLNESS: 06/05/21 ALL:  Brittney Williamson returns for follow up for OSA on CPAP. Her download below shows excellent compliance for days worn 97%. She does well for wearing her machine greater than 4 hours 73%. Her apnea is well managed with an AHI of 2 on auto pressure setting 5-15 mmHg. Moderate leak in the 95th percentile at 13. 2 L/min. She reports that she is doing ok but is struggling with raising a new puppy and states her sleep patterns are sporadic. She does endorse that her machine and CPAP are comfortable.      06/02/2020 ALL:  She returns for follow up for OSA on CPAP therapy.   Compliance report dated 05/02/2020 through 05/31/2020 reveals that she used CPAP 29 in the past 30 days for compliance of 97%.  She used CPAP greater than 4 hours 19 of the past 30 days for compliance of 63%.  Average usage on days used was 5 hours and 6 minutes.  Residual AHI was 2.3 on 5 to 15 cm of water and an EPR of 1.  There was no significant leak noted.   06/02/2019 ALL:  Brittney Williamson is a 71 y.o. female here today for follow up for OSA on CPAP. She is doing very well with CPAP therapy. She has gotten accustomed to using her machine every night and for at least 4 hours. She recently had left knee surgery and is recovering well. She feels great today and without concerns.   Compliance report dated 05/02/2019 through 05/31/2019 reveals that she has used CPAP 29 of the last 30 days for compliance of 97%.  27 of the last 30 days she used CPAP greater than 4 hours for compliance of 90%.  Average usage was 6 hours and 18 minutes.  Residual AHI was 3.4 on 5 to 15 cm of water and an  EPR of 1.  There was no significant leak noted.  HISTORY: (copied from my note on 01/07/2019)  Brittney Williamson is a 71 y.o. female here today for follow up of OSA on CPAP. She has continued to work on compliance.  She admits that she does not sleep for more than 4 hours at night.  She does take naps during the day.  She denies any concerns with CPAP therapy.   Compliance report dated 10/09/2018 through 01/06/2019 reveals that she is using CPAP 80 out of the last 90 days for compliance of 89%.  57 days she used CPAP greater than 4 hours for compliance of 63%.  Average usage was 4 hours and 46 minutes.  AHI was slightly elevated at 7.1 on 5 to 15 cm of water and an EPR of 3.  There was no significant leak.   HISTORY: (copied from my note on 10/07/2018)   Brittney Williamson is a 71 y.o. female here today for follow up of OSA on CPAP. She reports that she is continuing to adjust to CPAP therapy.  She is trying to use her machine each night.  Download report dated 09/07/2018 through 10/06/2018 reveals that over the last 30  days she is used her machine 27 out of 30 days for compliance of 90%.  19 days she used her machine greater than 4 hours for compliance of 63%.  Average usage was about 4 hours and 56 minutes.  AHI was 4.2 on 5 to 15 cm of water and an EPR level of 3.  There was no significant leak.  She does note benefit from CPAP therapy when used regularly.     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 After her diagnosis with sleep apnea out in the split-night titration study from 13 February 2016 she had voiced an interest in the dental device and had actually spoken to Dr. Ron Parker, but financial means were not such that she could pursue this treatment her AHI was rather high she  had severe apnea with an AHI of 45.7 slightly accentuated in supine and in rem sleep.  Her AHI became 1.3 after she was titrated to 9 cmH2O CPAP and she has therefore pursued for CPAP treatment she endorsed today the Epworth Sleepiness Scale at 6 points the fatigue severity scale of 0 the geriatric depression score at 1 out of 15.   Good compliance to CPAP however was spotty I do have a 3 months compliance data and for the month of November she did very well however in December she barely uses the CPAP every third day and she had not used it through the month of October either.  CPAP is set at 9 cm water pressure with 3 cm EPR and the average user time is 1 hour and 23 minutes.  She does have very minor air leakage and some central apneas arising.  I would like for her to continue on CPAP and she still owns an S9 AutoSet.  The patient stated her sleep study felt not representative for her normal sleep.  Can she repeat a study?  I will arrange for a HST.      UPDATE 01/23/2017.CM Brittney Williamson, 71 year old female returns for follow-up for CPAP compliance. She has severe obstructive sleep apnea and periodic limb movements. CPAP compliance data dated 12/24/2016-01/22/2017 shows greater than 4 hours for 17 days or 57%. Average usage 5 hours 5 minutes. Set pressure 9 cm EPR 3AHI 6.8.ESS 8. FSS 22. She returns for reevaluation.     07/02/16 CM: Brittney Williamson, 71 year old female returns for CPAP compliance after having a split-night titration sleep study on 02/13/2016. Her last compliance report greater than 4 hours 63% for 19 days. Her compliance today is 25 out of 30 days 83% compliance greater than 4 hours compliance 67%.   Her sleep  shows severe obstructive sleep apnea and periodic limb movements of sleep unknown  clinical significance. She is on  CPAP at 9 cm. She is advised to avoid sedative hypnotics which may worsen her sleep apnea also alcohol and tobacco. She claims  today that  sometimes when she goes to bed  she is too tired and forgets to use the appliance. She denies having any illnesses but she could not use.Average usage for days used 5 hours 23 minutes at 9 cm of pressure EPR level 3. AHI for 3.1.  She returns for reevaluation   REVIEW OF SYSTEMS: Out of a complete 14 system review of  symptoms, the patient complains only of the following symptoms, muscle cramps, fatigue and all other reviewed systems are negative  ESS: 5  ALLERGIES: Allergies  Allergen Reactions   Penicillins Itching   Statins Other (See Comments)    Myalgia and severe fatigue   Trimox [Amoxicillin Trihydrate] Itching    HOME MEDICATIONS: Outpatient Medications Prior to Visit  Medication Sig Dispense Refill   cilostazol (PLETAL) 100 MG tablet Take 1 tablet (100 mg total) by mouth 2 (two) times daily. 60 tablet 11   co-enzyme Q-10 50 MG capsule Take 50 mg by mouth daily.     ELDERBERRY PO Take 2 tablets by mouth daily.     fenofibrate (TRICOR) 145 MG tablet Take 145 mg by mouth daily.     magnesium 30 MG tablet Take 30 mg by mouth 2 (two) times daily.     Omega-3 1000 MG CAPS Take by mouth.     rosuvastatin (CRESTOR) 10 MG tablet Take 10 mg by mouth at bedtime.     vitamin B-12 (CYANOCOBALAMIN) 1000 MCG tablet Take 1,000 mcg by mouth daily.     VITAMIN D PO Take 20,000 Units by mouth daily. Vitamin D3+K2     Zinc Gluconate 30 MG TABS Take by mouth.     No facility-administered medications prior to visit.    PAST MEDICAL HISTORY: Past Medical History:  Diagnosis Date   Mitral valve prolapse 1993   OSA on CPAP     PAST SURGICAL HISTORY: Past Surgical History:  Procedure Laterality Date   KNEE ARTHROSCOPY WITH MEDIAL MENISECTOMY Left 05/27/2019   WRIST SURGERY Right 2017    FAMILY HISTORY: Family History  Adopted: Yes    SOCIAL HISTORY: Social History   Socioeconomic History   Marital status: Single    Spouse name: Not on file   Number of children: Not on file   Years of education: Not on file    Highest education level: Not on file  Occupational History   Not on file  Tobacco Use   Smoking status: Never   Smokeless tobacco: Never  Substance and Sexual Activity   Alcohol use: Yes    Comment: occ   Drug use: Never   Sexual activity: Not Currently  Other Topics Concern   Not on file  Social History Narrative   Not on file   Social Determinants of Health   Financial Resource Strain: Not on file  Food Insecurity: Not on file  Transportation Needs: Not on file  Physical Activity: Not on file  Stress: Not on file  Social Connections: Not on file  Intimate Partner Violence: Not on file      PHYSICAL EXAM  Vitals:   06/05/21 0810  BP: 130/80  Pulse: 84  SpO2: 96%  Weight: 175 lb (79.4 kg)  Height: 5\' 2"  (1.575 m)    Body mass index is 32.01 kg/m.  Generalized: Well developed, in no acute distress  Cardiology: normal rate and rhythm, no murmur noted Respiratory: Clear to auscultation bilaterally  Neurological examination  Mentation: Alert oriented to time, place, history taking. Follows all commands speech and language fluent Cranial nerve II-XII: Pupils were equal round reactive to light. Extraocular movements were full, visual field were full  Motor: The motor testing reveals 5 over 5 strength of all 4 extremities. Good symmetric motor tone is noted throughout.  Gait and station: Gait is normal.   DIAGNOSTIC DATA (LABS, IMAGING, TESTING) - I reviewed patient records, labs, notes, testing and imaging myself where  available.  No flowsheet data found.   Lab Results  Component Value Date   WBC 6.6 01/23/2021   HGB 14.0 01/23/2021   HCT 44.1 01/23/2021   MCV 86 01/23/2021   PLT 302 01/23/2021      Component Value Date/Time   NA 139 01/23/2021 1629   K 4.2 01/23/2021 1629   CL 102 01/23/2021 1629   CO2 21 01/23/2021 1629   GLUCOSE 113 (H) 01/23/2021 1629   GLUCOSE 99 09/05/2016 1614   BUN 10 01/23/2021 1629   CREATININE 0.81 01/23/2021 1629    CREATININE 0.80 11/18/2015 0811   CALCIUM 9.7 01/23/2021 1629   PROT 7.0 01/23/2021 1629   ALBUMIN 4.5 01/23/2021 1629   AST 22 01/23/2021 1629   ALT 24 01/23/2021 1629   ALKPHOS 94 01/23/2021 1629   BILITOT 0.2 01/23/2021 1629   GFRNONAA 63 05/20/2020 0829   GFRNONAA 78 11/18/2015 0811   GFRAA 73 05/20/2020 0829   GFRAA >89 11/18/2015 0811   Lab Results  Component Value Date   CHOL 257 (H) 01/23/2021   HDL 36 (L) 01/23/2021   LDLCALC 135 (H) 01/23/2021   LDLDIRECT 78 11/06/2019   TRIG 472 (H) 01/23/2021   CHOLHDL 7.1 (H) 01/23/2021   Lab Results  Component Value Date   HGBA1C 6.4 (H) 01/23/2021   No results found for: [id number] Lab Results  Component Value Date   TSH 0.803 01/23/2021       ASSESSMENT AND PLAN 71 y.o. year old female  has a past medical history of Mitral valve prolapse (1993) and OSA on CPAP. here with     ICD-10-CM   1. OSA on CPAP  G47.33 For home use only DME continuous positive airway pressure (CPAP)   Z99.89         Letrice is doing very well with CPAP therapy. Compliance report reveals excellent daily compliance. 4 hour compliance is better but hopefully it will improve as the puppy gets older. She was encouraged to continue using CPAP nightly and for greater than 4 hours each night. She will follow-up with [place] in 1 year, sooner if needed.  She verbalizes understanding and agreement with this plan.    Orders Placed This Encounter  Procedures   For home use only DME continuous positive airway pressure (CPAP)    Supplies    Order Specific Question:   Length of Need    Answer:   Lifetime    Order Specific Question:   Patient has OSA or probable OSA    Answer:   Yes    Order Specific Question:   Is the patient currently using CPAP in the home    Answer:   Yes    Order Specific Question:   Settings    Answer:   Other see comments    Order Specific Question:   CPAP supplies needed    Answer:   Mask, headgear, cushions, filters, heated  tubing and water chamber     No orders of the defined types were placed in this encounter.     Debbora Presto, FNP-C 06/05/2021, 10:11 AM [facility] [address] [place], [place] [address] [phone]

## 2021-06-02 DIAGNOSIS — Z20822 Contact with and (suspected) exposure to covid-19: Secondary | ICD-10-CM | POA: Diagnosis not present

## 2021-06-05 ENCOUNTER — Ambulatory Visit (INDEPENDENT_AMBULATORY_CARE_PROVIDER_SITE_OTHER): Payer: Medicare Other | Admitting: Family Medicine

## 2021-06-05 ENCOUNTER — Encounter: Payer: Self-pay | Admitting: Family Medicine

## 2021-06-05 VITALS — BP 130/80 | HR 84 | Ht 62.0 in | Wt 175.0 lb

## 2021-06-05 DIAGNOSIS — Z9989 Dependence on other enabling machines and devices: Secondary | ICD-10-CM

## 2021-06-05 DIAGNOSIS — G4733 Obstructive sleep apnea (adult) (pediatric): Secondary | ICD-10-CM

## 2021-06-05 NOTE — Progress Notes (Signed)
CM sent to [facility] for new orders

## 2021-06-08 DIAGNOSIS — R350 Frequency of micturition: Secondary | ICD-10-CM | POA: Diagnosis not present

## 2021-06-08 DIAGNOSIS — Z124 Encounter for screening for malignant neoplasm of cervix: Secondary | ICD-10-CM | POA: Diagnosis not present

## 2021-06-08 DIAGNOSIS — Z6831 Body mass index (BMI) 31.0-31.9, adult: Secondary | ICD-10-CM | POA: Diagnosis not present

## 2021-07-18 ENCOUNTER — Ambulatory Visit (HOSPITAL_COMMUNITY)
Admission: RE | Admit: 2021-07-18 | Discharge: 2021-07-18 | Disposition: A | Discharge status: Home / Self Care | Payer: Medicare Other | Source: Ambulatory Visit | Attending: Vascular Surgery | Admitting: Vascular Surgery

## 2021-07-18 ENCOUNTER — Other Ambulatory Visit: Payer: Self-pay

## 2021-07-18 ENCOUNTER — Encounter: Payer: Self-pay | Admitting: Vascular Surgery

## 2021-07-18 ENCOUNTER — Ambulatory Visit (INDEPENDENT_AMBULATORY_CARE_PROVIDER_SITE_OTHER): Payer: Medicare Other | Admitting: Vascular Surgery

## 2021-07-18 VITALS — BP 128/79 | HR 95 | Temp 98.0°F | Resp 16 | Ht 62.0 in | Wt 179.0 lb

## 2021-07-18 DIAGNOSIS — I70211 Atherosclerosis of native arteries of extremities with intermittent claudication, right leg: Secondary | ICD-10-CM

## 2021-07-18 NOTE — Progress Notes (Signed)
? ? ?Patient name: Brittney Williamson MRN: [id number] DOB: 14-Jul-1950 Sex: female ? ?REASON FOR CONSULT: 3 month follow-up, lower extremity claudication ? ?HPI: ?Brittney Williamson is a 71 y.o. female, with history of hypertension that presents for 3 month follow-up of lower extremity claudication.  She previously described burning and a charley horse in the right calf when she walks over the past 1.5+ years.  We recommended conservative therapy at the initial visit. ? ?She feels like her symptoms are slightly worse since last evaluation.  Now able to walk up to 50 yards before her claudication starts.  She has been doing walking therapies.  Did not feel the Pletal was helpful.  Does not smoke.  No rest pain or tissue loss to suggest CLI. ? ?Past Medical History:  ?Diagnosis Date  ? Mitral valve prolapse 1993  ? OSA on CPAP   ? ? ?Past Surgical History:  ?Procedure Laterality Date  ? KNEE ARTHROSCOPY WITH MEDIAL MENISECTOMY Left 05/27/2019  ? WRIST SURGERY Right 2017  ? ? ?Family History  ?Adopted: Yes  ? ? ?SOCIAL HISTORY: ?Social History  ? ?Socioeconomic History  ? Marital status: Single  ?  Spouse name: Not on file  ? Number of children: Not on file  ? Years of education: Not on file  ? Highest education level: Not on file  ?Occupational History  ? Not on file  ?Tobacco Use  ? Smoking status: Never  ? Smokeless tobacco: Never  ?Substance and Sexual Activity  ? Alcohol use: Yes  ?  Comment: occ  ? Drug use: Never  ? Sexual activity: Not Currently  ?Other Topics Concern  ? Not on file  ?Social History Narrative  ? Not on file  ? ?Social Determinants of Health  ? ?Financial Resource Strain: Not on file  ?Food Insecurity: Not on file  ?Transportation Needs: Not on file  ?Physical Activity: Not on file  ?Stress: Not on file  ?Social Connections: Not on file  ?Intimate Partner Violence: Not on file  ? ? ?Allergies  ?Allergen Reactions  ? Penicillins Itching  ? Statins Other (See Comments)  ?  Myalgia and severe fatigue  ?  Trimox [Amoxicillin Trihydrate] Itching  ? ? ?Current Outpatient Medications  ?Medication Sig Dispense Refill  ? cilostazol (PLETAL) 100 MG tablet Take 1 tablet (100 mg total) by mouth 2 (two) times daily. 60 tablet 11  ? co-enzyme Q-10 50 MG capsule Take 50 mg by mouth daily.    ? ELDERBERRY PO Take 2 tablets by mouth daily.    ? fenofibrate (TRICOR) 145 MG tablet Take 145 mg by mouth daily.    ? magnesium 30 MG tablet Take 30 mg by mouth 2 (two) times daily.    ? Omega-3 1000 MG CAPS Take by mouth.    ? rosuvastatin (CRESTOR) 10 MG tablet Take 10 mg by mouth at bedtime.    ? vitamin B-12 (CYANOCOBALAMIN) 1000 MCG tablet Take 1,000 mcg by mouth daily.    ? VITAMIN D PO Take 20,000 Units by mouth daily. Vitamin D3+K2    ? Zinc Gluconate 30 MG TABS Take by mouth.    ? ?No current facility-administered medications for this visit.  ? ? ?REVIEW OF SYSTEMS:  ?'[X]'$  denotes positive finding, '[ ]'$  denotes negative finding ?Cardiac  Comments:  ?Chest pain or chest pressure:    ?Shortness of breath upon exertion:    ?Short of breath when lying flat:    ?Irregular heart rhythm:    ?    ?  Vascular    ?Pain in calf, thigh, or hip brought on by ambulation: x Right calf  ?Pain in feet at night that wakes you up from your sleep:     ?Blood clot in your veins:    ?Leg swelling:     ?    ?Pulmonary    ?Oxygen at home:    ?Productive cough:     ?Wheezing:     ?    ?Neurologic    ?Sudden weakness in arms or legs:     ?Sudden numbness in arms or legs:     ?Sudden onset of difficulty speaking or slurred speech:    ?Temporary loss of vision in one eye:     ?Problems with dizziness:     ?    ?Gastrointestinal    ?Blood in stool:     ?Vomited blood:     ?    ?Genitourinary    ?Burning when urinating:     ?Blood in urine:    ?    ?Psychiatric    ?Major depression:     ?    ?Hematologic    ?Bleeding problems:    ?Problems with blood clotting too easily:    ?    ?Skin    ?Rashes or ulcers:    ?    ?Constitutional    ?Fever or chills:     ? ? ?PHYSICAL EXAM: ?Vitals:  ? 07/18/21 0821  ?BP: 128/79  ?Pulse: 95  ?Resp: 16  ?Temp: 98 ?F (36.7 ?C)  ?TempSrc: Temporal  ?SpO2: 95%  ?Weight: 179 lb (81.2 kg)  ?Height: '5\' 2"'$  (1.575 m)  ? ? ?GENERAL: The patient is a well-nourished female, in no acute distress. The vital signs are documented above. ?CARDIAC: There is a regular rate and rhythm.  ?VASCULAR:  ?Palpable femoral pulses bilaterally ?No palpable pedal pulses ?No lower extremity tissue loss ?PULMONARY: No respiratory distress ?ABDOMEN: Soft and non-tender. ?MUSCULOSKELETAL: There are no major deformities or cyanosis. ?NEUROLOGIC: No focal weakness or paresthesias are detected. ?SKIN: There are no ulcers or rashes noted. ?PSYCHIATRIC: The patient has a normal affect. ? ?DATA:  ? ?ABI's today 0.68 right monophasic and 0.92 left biphasic (previously 0.62 right and 0.9 left) ? ?Lower extremity arterial duplex 04/05/2021 shows occlusion of the distal right popliteal artery and TP trunk.  Left lower extremity has a total occlusion of the anterior tibial and segmental occlusion of the posterior tibial. ? ? ?Assessment/Plan: ? ?71 year old female presents for 43-monthfollow-up of her right lower extremity claudication.  Discussed her ABIs are mildly improved from 0.62 to 0.68 with conservative therapy.  She feels her symptoms are slightly worse.  Discussed that claudication is not limb threatening and we should intervene when she feels it is interfering in her daily activities.  Right now she is still able to work at [employer] limitation.  Walking up to 50 yards until she has to slow down or stop.  Ultimately we talked about intervention with lower extremity arteriogram versus continued observation.  She has elected for continued observation for now and I will see her in 6 months with ABIs.  Discussed she call me if things get worse.  I discussed she can stop the Pletal as she feels this has not helped.  Continue aspirin and statin. ? ? ?CMarty Heck MD ?[facility]?Office: [phone]? ? ? ? ?

## 2021-07-19 ENCOUNTER — Encounter: Payer: Self-pay | Admitting: Family Medicine

## 2021-07-19 DIAGNOSIS — M8589 Other specified disorders of bone density and structure, multiple sites: Secondary | ICD-10-CM | POA: Diagnosis not present

## 2021-07-19 DIAGNOSIS — Z780 Asymptomatic menopausal state: Secondary | ICD-10-CM | POA: Diagnosis not present

## 2021-07-19 DIAGNOSIS — Z1231 Encounter for screening mammogram for malignant neoplasm of breast: Secondary | ICD-10-CM | POA: Diagnosis not present

## 2021-07-21 ENCOUNTER — Other Ambulatory Visit: Payer: Self-pay | Admitting: *Deleted

## 2021-07-21 DIAGNOSIS — I70211 Atherosclerosis of native arteries of extremities with intermittent claudication, right leg: Secondary | ICD-10-CM

## 2021-07-24 ENCOUNTER — Ambulatory Visit (INDEPENDENT_AMBULATORY_CARE_PROVIDER_SITE_OTHER): Payer: Medicare Other | Admitting: Family Medicine

## 2021-07-24 ENCOUNTER — Encounter (HOSPITAL_BASED_OUTPATIENT_CLINIC_OR_DEPARTMENT_OTHER): Payer: Self-pay | Admitting: Family Medicine

## 2021-07-24 ENCOUNTER — Ambulatory Visit (HOSPITAL_BASED_OUTPATIENT_CLINIC_OR_DEPARTMENT_OTHER): Payer: Medicare Other

## 2021-07-24 VITALS — BP 117/72 | HR 97 | Temp 98.7°F | Ht 62.0 in | Wt 180.4 lb

## 2021-07-24 DIAGNOSIS — E782 Mixed hyperlipidemia: Secondary | ICD-10-CM

## 2021-07-24 DIAGNOSIS — I70211 Atherosclerosis of native arteries of extremities with intermittent claudication, right leg: Secondary | ICD-10-CM | POA: Diagnosis not present

## 2021-07-24 DIAGNOSIS — M179 Osteoarthritis of knee, unspecified: Secondary | ICD-10-CM | POA: Insufficient documentation

## 2021-07-24 DIAGNOSIS — I10 Essential (primary) hypertension: Secondary | ICD-10-CM

## 2021-07-24 DIAGNOSIS — R7303 Prediabetes: Secondary | ICD-10-CM | POA: Insufficient documentation

## 2021-07-24 DIAGNOSIS — M170 Bilateral primary osteoarthritis of knee: Secondary | ICD-10-CM

## 2021-07-24 NOTE — Assessment & Plan Note (Signed)
Blood pressure at goal today ?Denies any issues with chest pain or headaches. ?Physical activity generally limited due to lower extremity claudication, knee osteoarthritis ?Can continue with lifestyle modifications, intermittent monitoring at home, DASH diet ?

## 2021-07-24 NOTE — Assessment & Plan Note (Signed)
A1c elevated at 6.4% on labs about 6 months ago, has been working on lifestyle modifications ?We will check hemoglobin A1c later this week for monitoring ?

## 2021-07-24 NOTE — Patient Instructions (Signed)
?  Medication Instructions:  ?Your physician recommends that you continue on your current medications as directed. Please refer to the Current Medication list given to you today. ?--If you need a refill on any your medications before your next appointment, please call your pharmacy first. If no refills are authorized on file call the office.-- ? ? ?Follow-Up: ?Your next appointment:   ?Your physician recommends that you schedule a follow-up appointment in: 3 month follow up with Dr. [name] - schedule nurse visit fasting labs ? ?You will receive a text message or e-mail with a link to a survey about your care and experience with [place] today! We would greatly appreciate your feedback!  ? ?Thanks for letting us be apart of your health journey!!  ?Primary Care and Sports Medicine  ? ?Dr. Kyung Rudd [name]  ? ?We encourage you to activate your patient portal called "MyChart".  Sign up information is provided on this After Visit Summary.  MyChart is used to connect with patients for Virtual Visits (Telemedicine).  Patients are able to view lab/test results, encounter notes, upcoming appointments, etc.  Non-urgent messages can be sent to your provider as well. To learn more about what you can do with MyChart, please visit --  [email or web address].    ?

## 2021-07-24 NOTE — Progress Notes (Signed)
? ? ?  Procedures performed today:   ? ?None. ? ?Independent interpretation of notes and tests performed by another provider:  ? ?None. ? ?Brief History, Exam, Impression, and Recommendations:   ? ?BP 117/72   Pulse 97   Temp 98.7 ?F (37.1 ?C)   Ht '5\' 2"'$  (1.575 m)   Wt 180 lb 6.4 oz (81.8 kg)   SpO2 97%   BMI 33.00 kg/m?  ? ?Essential hypertension ?Blood pressure at goal today ?Denies any issues with chest pain or headaches. ?Physical activity generally limited due to lower extremity claudication, knee osteoarthritis ?Can continue with lifestyle modifications, intermittent monitoring at home, DASH diet ? ?Atherosclerosis of native arteries of extremity with intermittent claudication ([place]) ?Continues to follow with vascular surgery, no interventions as of yet.  Has been utilizing Pletal without significant change in symptoms ?At present, patient continues with monitoring.  Vascular surgeon did discuss potential interventions versus monitoring, follow-up scheduled for 6 months, she can follow-up sooner if symptoms are worsening ?Continues with statin ? ?Knee osteoarthritis ?History of bilateral knee pain with prior arthroscopies completed.  She has had 2 recent MRIs of left knee in 2021 and 2022.  These showed tricompartmental osteoarthritis within the left knee with associated meniscal abnormalities ?Discussed with patient that given ongoing symptoms, can consider reevaluation with orthopedic surgeon, whether she would prefer to follow-up with prior surgeon or would like referral to alternative provider.  Patient to consider and let us know ? ?Prediabetes ?A1c elevated at 6.4% on labs about 6 months ago, has been working on lifestyle modifications ?We will check hemoglobin A1c later this week for monitoring ? ?Mixed hyperlipidemia ?Continues with statin.  She continues to follow with cardiology ?She had resumed taking statin in November, requesting updated lipid panel to assess current status ?She did eat  breakfast this morning, thus will have patient return for fasting labs later in the week at her convenience ? ?Plan for follow-up in about 3 months or sooner as needed ? ? ?___________________________________________ ?Chevon Fomby [name], MD, ABFM, CAQSM ?Primary Care and Sports Medicine ?[place] ? ?

## 2021-07-24 NOTE — Assessment & Plan Note (Signed)
History of bilateral knee pain with prior arthroscopies completed.  She has had 2 recent MRIs of left knee in 2021 and 2022.  These showed tricompartmental osteoarthritis within the left knee with associated meniscal abnormalities ?Discussed with patient that given ongoing symptoms, can consider reevaluation with orthopedic surgeon, whether she would prefer to follow-up with prior surgeon or would like referral to alternative provider.  Patient to consider and let us know ?

## 2021-07-24 NOTE — Assessment & Plan Note (Signed)
Continues to follow with vascular surgery, no interventions as of yet.  Has been utilizing Pletal without significant change in symptoms ?At present, patient continues with monitoring.  Vascular surgeon did discuss potential interventions versus monitoring, follow-up scheduled for 6 months, she can follow-up sooner if symptoms are worsening ?Continues with statin ?

## 2021-07-24 NOTE — Assessment & Plan Note (Signed)
Continues with statin.  She continues to follow with cardiology ?She had resumed taking statin in November, requesting updated lipid panel to assess current status ?She did eat breakfast this morning, thus will have patient return for fasting labs later in the week at her convenience ?

## 2021-07-25 ENCOUNTER — Ambulatory Visit (HOSPITAL_BASED_OUTPATIENT_CLINIC_OR_DEPARTMENT_OTHER): Payer: Medicare Other

## 2021-07-25 DIAGNOSIS — R7303 Prediabetes: Secondary | ICD-10-CM

## 2021-07-25 DIAGNOSIS — E782 Mixed hyperlipidemia: Secondary | ICD-10-CM

## 2021-07-25 DIAGNOSIS — I10 Essential (primary) hypertension: Secondary | ICD-10-CM

## 2021-07-26 LAB — LIPID PANEL
Chol/HDL Ratio: 2.7 ratio (ref 0.0–4.4)
Cholesterol, Total: 115 mg/dL (ref 100–199)
HDL: 42 mg/dL (ref >39)
LDL Chol Calc (NIH): 48 mg/dL (ref 0–99)
Triglycerides: 145 mg/dL (ref 0–149)
VLDL Cholesterol Cal: 25 mg/dL (ref 5–40)

## 2021-07-26 LAB — HEMOGLOBIN A1C
Est. average glucose Bld gHb Est-mCnc: 140 mg/dL
Hgb A1c MFr Bld: 6.5 % — ABNORMAL HIGH (ref 4.8–5.6)

## 2021-07-27 ENCOUNTER — Encounter (HOSPITAL_BASED_OUTPATIENT_CLINIC_OR_DEPARTMENT_OTHER): Payer: Self-pay | Admitting: *Deleted

## 2021-08-09 DIAGNOSIS — Z20822 Contact with and (suspected) exposure to covid-19: Secondary | ICD-10-CM | POA: Diagnosis not present

## 2021-08-24 ENCOUNTER — Telehealth: Payer: Self-pay | Admitting: Cardiology

## 2021-08-24 ENCOUNTER — Encounter (HOSPITAL_BASED_OUTPATIENT_CLINIC_OR_DEPARTMENT_OTHER): Payer: Self-pay

## 2021-08-24 DIAGNOSIS — Z20822 Contact with and (suspected) exposure to covid-19: Secondary | ICD-10-CM | POA: Diagnosis not present

## 2021-08-24 NOTE — Telephone Encounter (Signed)
Patient requesting lab work for her A1c and a lipid panel. She would like a call back letting her know so she can schedule it. ?

## 2021-08-24 NOTE — Telephone Encounter (Signed)
Pt aware after reviewing chart both labs were recently done on 4/4 and PCP would like to see pt in 4 -6 weeks Per pt does have appt to f/u Pt agrees with this and will let PCP address ./cy ?

## 2021-08-28 DIAGNOSIS — Z20822 Contact with and (suspected) exposure to covid-19: Secondary | ICD-10-CM | POA: Diagnosis not present

## 2021-09-15 ENCOUNTER — Encounter (HOSPITAL_BASED_OUTPATIENT_CLINIC_OR_DEPARTMENT_OTHER): Payer: Self-pay | Admitting: Family Medicine

## 2021-09-15 ENCOUNTER — Ambulatory Visit (INDEPENDENT_AMBULATORY_CARE_PROVIDER_SITE_OTHER): Payer: Medicare Other | Admitting: Family Medicine

## 2021-09-15 VITALS — BP 153/91 | HR 77 | Ht 62.0 in | Wt 180.6 lb

## 2021-09-15 DIAGNOSIS — M170 Bilateral primary osteoarthritis of knee: Secondary | ICD-10-CM | POA: Diagnosis not present

## 2021-09-15 DIAGNOSIS — I10 Essential (primary) hypertension: Secondary | ICD-10-CM

## 2021-09-15 DIAGNOSIS — E119 Type 2 diabetes mellitus without complications: Secondary | ICD-10-CM | POA: Diagnosis not present

## 2021-09-15 NOTE — Assessment & Plan Note (Addendum)
Blood pressure elevated in office today, is improved on recheck but still elevated.  At prior office visits it has been better controlled.  No current issues with chest pain or headaches At this time, can continue with monitoring, lifestyle modifications May need to consider addition of pharmacologic agent if blood pressure continues to be above goal Recommend intermittent monitoring at home, DASH diet

## 2021-09-15 NOTE — Assessment & Plan Note (Signed)
New diagnosis for patient, did previously have prediabetes, however most recent hemoglobin A1c is now at 6.5%.  Discussed meaning of diagnosis.  Given patient factors and history, would recommend targeting goal of less than 7.0% for A1c.  At this time, can continue lifestyle modifications, consider initiating metformin in the future We will check urine microalbumin/creatinine ratio today Discuss importance of pneumococcal vaccination in the future both related to diabetes as well as patient age

## 2021-09-15 NOTE — Progress Notes (Signed)
   lllll Procedures performed today:    None.  Independent interpretation of notes and tests performed by another provider:   None.  Brief History, Exam, Impression, and Recommendations:    BP (!) 153/91   Pulse 77   Ht '5\' 2"'$  (1.575 m)   Wt 180 lb 9.6 oz (81.9 kg)   SpO2 99%   BMI 33.03 kg/m   Essential hypertension Blood pressure elevated in office today, is improved on recheck but still elevated.  At prior office visits it has been better controlled.  No current issues with chest pain or headaches At this time, can continue with monitoring, lifestyle modifications May need to consider addition of pharmacologic agent if blood pressure continues to be above goal Recommend intermittent monitoring at home, DASH diet  Diabetes mellitus ([place]) New diagnosis for patient, did previously have prediabetes, however most recent hemoglobin A1c is now at 6.5%.  Discussed meaning of diagnosis.  Given patient factors and history, would recommend targeting goal of less than 7.0% for A1c.  At this time, can continue lifestyle modifications, consider initiating metformin in the future We will check urine microalbumin/creatinine ratio today Discuss importance of pneumococcal vaccination in the future both related to diabetes as well as patient age  Knee osteoarthritis No new issue for patient, she has had prior injections and procedures completed.  Most recent x-rays for about 3 years ago with recent MRI of left knee last year.  She was seeing Dr. Ronnie Derby, may be interested in second opinion, referral placed today to Ortho care. Did discuss with patient that despite known PAD, symptoms more specifically related to bilateral knees is more likely related to underlying osteoarthritis as well as known meniscal abnormalities.  As result, treatment for this would likely centered around bracing, injection therapy, physical therapy, consideration of surgical interventions.  She seems to be hesitant regarding a  lot of these modalities despite also reporting how impactful knee related symptoms are on her pain  Return in about 6 weeks (around 10/27/2021) for HTN, DM. Plan to recheck A1c at next OV   ___________________________________________ Brittney Oatley [name], MD, ABFM, [facility] and [place]

## 2021-09-15 NOTE — Assessment & Plan Note (Signed)
No new issue for patient, she has had prior injections and procedures completed.  Most recent x-rays for about 3 years ago with recent MRI of left knee last year.  She was seeing Dr. Ronnie Derby, may be interested in second opinion, referral placed today to Ortho care. Did discuss with patient that despite known PAD, symptoms more specifically related to bilateral knees is more likely related to underlying osteoarthritis as well as known meniscal abnormalities.  As result, treatment for this would likely centered around bracing, injection therapy, physical therapy, consideration of surgical interventions.  She seems to be hesitant regarding a lot of these modalities despite also reporting how impactful knee related symptoms are on her pain

## 2021-09-15 NOTE — Patient Instructions (Signed)
  Medication Instructions:  Your physician recommends that you continue on your current medications as directed. Please refer to the Current Medication list given to you today. --If you need a refill on any your medications before your next appointment, please call your pharmacy first. If no refills are authorized on file call the office.-- Lab Work: Your physician has recommended that you have lab work today: Yes If you have labs (blood work) drawn today and your tests are completely normal, you will receive your results via MyChart message OR a phone call from our staff.  Please ensure you check your voicemail in the event that you authorized detailed messages to be left on a delegated number. If you have any lab test that is abnormal or we need to change your treatment, we will call you to review the results.  Referrals/Procedures/Imaging: No  Follow-Up: Your next appointment:   Your physician recommends that you schedule a follow-up appointment in: 6 weeks with Dr. [place].  You will receive a text message or e-mail with a link to a survey about your care and experience with us today! We would greatly appreciate your feedback!   Thanks for letting us be apart of your health journey!!  Primary Care and Sports Medicine   Dr. Raymond de [place]   We encourage you to activate your patient portal called "MyChart".  Sign up information is provided on this After Visit Summary.  MyChart is used to connect with patients for Virtual Visits (Telemedicine).  Patients are able to view lab/test results, encounter notes, upcoming appointments, etc.  Non-urgent messages can be sent to your provider as well. To learn more about what you can do with MyChart, please visit --  [email or web address].    

## 2021-09-18 LAB — MICROALBUMIN / CREATININE URINE RATIO
Creatinine, Urine: 77.9 mg/dL
Microalb/Creat Ratio: 21 mg/g creat (ref 0–29)
Microalbumin, Urine: 16.4 ug/mL

## 2021-09-19 NOTE — Progress Notes (Unsigned)
Cardiology Office Note:    Date:  09/21/2021   ID:  Brittney Williamson, DOB Jun 07, 1950, MRN [id number]  PCP:  [name], [name], MD   [facility] Providers Cardiologist:  Candee Furbish, MD     Referring MD: [name], [name], MD   Chief Complaint: follow-up hypertension, hyperlipidemia  History of Present Illness:    Brittney Williamson is a very pleasant 71 y.o. female with a hx of hypertension, hyperlipidemia, amaurosis fugax of right eye, PAD, and OSA on CPAP.   Referred to cardiology for very high BP. Had been told she had mitral valve prolapse in the past but mitral valve appeared normal on echo 09/07/2019.   She was last seen in our office by Dr. [name] on 03/23/21.  She reported she had stopped Crestor and fenofibrate and was taking only dietary supplements. Coronary calcium score ordered for further risk stratification which was zero.  She was having leg pain and arterial studies revealed bilateral arterial disease. She was referred to [facility] and elected to manage medically with Pletal, aspirin, and statin.   Today, she is here alone for follow-up. Reports bilateral leg claudication, tingling in toes by the end of the day, and frequent leg cramps. Wears compression hose daily. Works 3 days per week driving cars for [employer] and is in the car for several hours each day so on these days she is unable to get up and walk around. Was seen by Dr. Carlis Abbott with [facility] but is concerned about having vein grafts and is seeking a second opinion at [facility].  Did not feel that Pletal was initially very helpful, but is going to try it again. Recently diagnosed with diabetes with A1C of 6.5. Admits she loves sugary snacks and salty foods. Does not drink soda. Reports SOB on exertion.  Does not exercise on a consistent basis but stays active on the days she is not working by doing yard work and playing with her puppy.  She denies chest pain, fatigue, palpitations, melena, hematuria, hemoptysis, diaphoresis, weakness,  presyncope, syncope, orthopnea, and PND.  Compliant with CPAP but only sleeps 5 hours per night.  Past Medical History:  Diagnosis Date   Mitral valve prolapse 1993   OSA on CPAP     Past Surgical History:  Procedure Laterality Date   KNEE ARTHROSCOPY WITH MEDIAL MENISECTOMY Left 05/27/2019   WRIST SURGERY Right 2017    Current Medications: Current Meds  Medication Sig   cilostazol (PLETAL) 100 MG tablet Take 1 tablet (100 mg total) by mouth 2 (two) times daily.   co-enzyme Q-10 50 MG capsule Take 50 mg by mouth daily.   ELDERBERRY PO Take 2 tablets by mouth daily.   fenofibrate (TRICOR) 145 MG tablet Take 145 mg by mouth daily.   Krill Oil 500 MG CAPS Take 1 capsule by mouth daily.   magnesium 30 MG tablet Take 30 mg by mouth 2 (two) times daily.   rosuvastatin (CRESTOR) 10 MG tablet Take 10 mg by mouth at bedtime.   vitamin B-12 (CYANOCOBALAMIN) 1000 MCG tablet Take 1,000 mcg by mouth daily.   VITAMIN D PO Take 20,000 Units by mouth daily. Vitamin D3+K2   Zinc Gluconate 30 MG TABS Take by mouth.   [DISCONTINUED] Omega-3 1000 MG CAPS Take by mouth.     Allergies:   Penicillins, Statins, and Trimox [amoxicillin trihydrate]   Social History   Socioeconomic History   Marital status: Single    Spouse name: Not on file  Number of children: Not on file   Years of education: Not on file   Highest education level: Not on file  Occupational History   Not on file  Tobacco Use   Smoking status: Never   Smokeless tobacco: Never  Substance and Sexual Activity   Alcohol use: Yes    Comment: occ   Drug use: Never   Sexual activity: Not Currently  Other Topics Concern   Not on file  Social History Narrative   Not on file   Social Determinants of Health   Financial Resource Strain: Not on file  Food Insecurity: Not on file  Transportation Needs: Not on file  Physical Activity: Not on file  Stress: Not on file  Social Connections: Not on file     Family History: The  patient's family history is not on file. She was adopted.  ROS:   Please see the history of present illness.   + shortness of breath All other systems reviewed and are negative.  Labs/Other Studies Reviewed:    The following studies were reviewed today:  VAS [place] ABI 07/18/2021  Right: Resting right ankle-brachial index indicates moderate right lower  extremity arterial disease. The right toe-brachial index is abnormal.   Left: Resting left ankle-brachial index indicates mild left lower  extremity arterial disease. The left toe-brachial index is abnormal.   Echo 09/07/2019  1. Normal LV systolic function; grade 1 diastolic dysfunction; mild LVH;  trace MR and TR.   2. Left ventricular ejection fraction, by estimation, is 60 to 65%. The  left ventricle has normal function. The left ventricle has no regional  wall motion abnormalities. There is mild left ventricular hypertrophy.  Left ventricular diastolic parameters  are consistent with Grade I diastolic dysfunction (impaired relaxation).   3. Right ventricular systolic function is normal. The right ventricular  size is normal. Tricuspid regurgitation signal is inadequate for assessing  PA pressure.   4. The mitral valve is normal in structure. Trivial mitral valve  regurgitation. No evidence of mitral stenosis.   5. The aortic valve is tricuspid. Aortic valve regurgitation is not  visualized. Mild aortic valve sclerosis is present, with no evidence of  aortic valve stenosis.   6. The inferior vena cava is normal in size with greater than 50%  respiratory variability, suggesting right atrial pressure of 3 mmHg.    CT Cardiac Score 04/21/21  Coronary calcium score of 0.   VAS US Carotid Duplex  Right Carotid: Velocities in the right ICA are consistent with a 1-39%  stenosis.   Left Carotid: There was no evidence of thrombus, dissection,  atherosclerotic plaque or stenosis in the cervical carotid system. Minimum  plaque in  the external carotid artery.   Vertebrals:  Bilateral vertebral arteries demonstrate antegrade flow.  Subclavians: Normal flow hemodynamics were seen in bilateral subclavian  arteries.   Recent Labs: 01/23/2021: ALT 24; BUN 10; Creatinine, Ser 0.81; Hemoglobin 14.0; Platelets 302; Potassium 4.2; Sodium 139; TSH 0.803  Recent Lipid Panel    Component Value Date/Time   CHOL 115 07/25/2021 0801   TRIG 145 07/25/2021 0801   HDL 42 07/25/2021 0801   CHOLHDL 2.7 07/25/2021 0801   CHOLHDL 6 04/14/2018 0919   VLDL 67.8 (H) 04/14/2018 0919   LDLCALC 48 07/25/2021 0801   LDLDIRECT 78 11/06/2019 0832   LDLDIRECT 57.0 04/14/2018 0919     Risk Assessment/Calculations:         Physical Exam:    VS:  BP 130/88  Pulse 74   Ht '5\' 2"'$  (1.575 m)   Wt 180 lb 9.6 oz (81.9 kg)   SpO2 98%   BMI 33.03 kg/m     Wt Readings from Last 3 Encounters:  09/21/21 180 lb 9.6 oz (81.9 kg)  09/15/21 180 lb 9.6 oz (81.9 kg)  07/24/21 180 lb 6.4 oz (81.8 kg)     GEN:  Well nourished, well developed in no acute distress HEENT: Normal NECK: No JVD; No carotid bruits CARDIAC: RRR, no murmurs, rubs, gallops RESPIRATORY:  Clear to auscultation without rales, wheezing or rhonchi  ABDOMEN: Soft, non-tender, non-distended MUSCULOSKELETAL: In compression stockings. No deformity. 2+ pedal pulses, equal bilaterally SKIN: Warm and dry NEUROLOGIC:  Alert and oriented x 3 PSYCHIATRIC:  Normal affect   EKG:  EKG is not ordered today.   Diagnoses:    1. Essential hypertension   2. Atherosclerosis of native artery of both lower extremities with intermittent claudication ([place])   3. Hyperlipidemia LDL goal <70   4. Amaurosis fugax   5. Shortness of breath   6. LVH (left ventricular hypertrophy)    Assessment and Plan:     Dyspnea: She reports shortness of breath with activity.  She is feels this is chronic and stable.  Diet is fairly high in sodium and she is limited with regular physical activity by  bilateral claudication. No orthopnea, PND, or edema. Do not feel that repeat echo is indicated at this time as she reports symptoms are stable. Have strongly encouraged a reduced sodium diet.   Hypertension: LVH on echo 09/07/2019. BP elevated here and at home and other recent provider visits. Very hesitant to start medication. Asked her to monitor BP at home and report back to [place] in 2 weeks.  We had a long discussion about diet and exercise.  Heart healthy mostly plant-based diet strongly emphasized in the setting of hypertension and diabetes.  Hyperlipidemia: LDL 48 on 07/25/21. Continue Crestor. Healthy diet and regular exercise recommended.   Diabetes: A1C 6.5 on 07/25/21, a 0.1 increase from 8 months prior.  She does not want to take medication for diabetes.  As noted above, I strongly emphasized the importance of mostly plant-based diet and regular physical activity.  PAD: Continues to have s/s of claudication being treated with medical therapy. Management per [facility]. Seeking a 2nd opinion.   Carotid artery disease: Mild right carotid artery plaque by ultrasound 08/2019.  History of amaurosis fugax. LDL is at goal.  Continue Crestor.  Disposition: 12 months with Dr. [name]   Medication Adjustments/Labs and Tests Ordered: Current medicines are reviewed at length with the patient today.  Concerns regarding medicines are outlined above.  No orders of the defined types were placed in this encounter.  No orders of the defined types were placed in this encounter.   Patient Instructions  Medication Instructions:  Your physician recommends that you continue on your current medications as directed. Please refer to the Current Medication list given to you today.  *If you need a refill on your cardiac medications before your next appointment, please call your pharmacy*   Lab Work: None ordered  If you have labs (blood work) drawn today and your tests are completely normal, you will receive your  results only by: [place] (if you have MyChart) OR A paper copy in the mail If you have any lab test that is abnormal or we need to change your treatment, we will call you to review the results.   Testing/Procedures: None  ordered   Follow-Up: At [facility], you and your health needs are our priority.  As part of our continuing mission to provide you with exceptional heart care, we have created designated Provider Care Teams.  These Care Teams include your primary Cardiologist (physician) and Advanced Practice Providers (APPs -  Physician Assistants and Nurse Practitioners) who all work together to provide you with the care you need, when you need it.  We recommend signing up for the patient portal called "MyChart".  Sign up information is provided on this After Visit Summary.  MyChart is used to connect with patients for Virtual Visits (Telemedicine).  Patients are able to view lab/test results, encounter notes, upcoming appointments, etc.  Non-urgent messages can be sent to your provider as well.   To learn more about what you can do with MyChart, go to [email or web address].    Your next appointment:   12 month(s)  The format for your next appointment:   In Person  Provider:   Candee Furbish, MD     Other Instructions Your physician has requested that you regularly monitor and record your blood pressure readings at home. Please use the same machine at the same time of day to check your readings and record them to bring to your follow-up visit.   Please monitor blood pressures and keep a log of your readings for 2-3 weeks then sen in a mychart message with those readings or call the office, [phone]..    Make sure to check 2 hours after your medications.    AVOID these things for 30 minutes before checking your blood pressure: No Drinking caffeine. No Drinking alcohol. No Eating. No Smoking. No Exercising.   Five minutes before checking your blood  pressure: Pee. Sit in a dining chair. Avoid sitting in a soft couch or armchair. Be quiet. Do not talk   Adopting a Healthy Lifestyle.   Weight: Know what a healthy weight is for you (roughly BMI <25) and aim to maintain this. You can calculate your body mass index on your smart phone  Diet: Aim for 7+ servings of fruits and vegetables daily Limit animal fats in diet for cholesterol and heart health - choose grass fed whenever available Avoid highly processed foods (fast food burgers, tacos, fried chicken, pizza, hot dogs, french fries)  Saturated fat comes in the form of butter, lard, coconut oil, margarine, partially hydrogenated oils, and fat in meat. These increase your risk of cardiovascular disease.  Use healthy plant oils, such as olive, canola, soy, corn, sunflower and peanut.  Whole foods such as fruits, vegetables and whole grains have fiber  Men need > 38 grams of fiber per day Women need > 25 grams of fiber per day  Load up on vegetables and fruits - one-half of your plate: Aim for color and variety, and remember that potatoes dont count. Go for whole grains - one-quarter of your plate: Whole wheat, barley, wheat berries, quinoa, oats, brown rice, and foods made with them. If you want pasta, go with whole wheat pasta. Protein power - one-quarter of your plate: Fish, chicken, beans, and nuts are all healthy, versatile protein sources. Limit red meat. You need carbohydrates for energy! The type of carbohydrate is more important than the amount. Choose carbohydrates such as vegetables, fruits, whole grains, beans, and nuts in the place of white rice, white pasta, potatoes (baked or fried), macaroni and cheese, cakes, cookies, and donuts.  If youre thirsty, drink water. Coffee and tea are good in  moderation, but skip sugary drinks and limit milk and dairy products to one or two daily servings. Keep sugar intake at 6 teaspoons or 24 grams or LESS       Exercise: Aim for 150 min  of moderate intensity exercise weekly for heart health, and weights twice weekly for bone health Stay active - any steps are better than no steps! Aim for 7-9 hours of sleep daily          Important Information About Sugar         Signed, Emmaline Life, NP  09/21/2021 4:07 PM    [facility]

## 2021-09-21 ENCOUNTER — Ambulatory Visit (INDEPENDENT_AMBULATORY_CARE_PROVIDER_SITE_OTHER): Payer: Medicare Other | Admitting: Nurse Practitioner

## 2021-09-21 ENCOUNTER — Encounter: Payer: Self-pay | Admitting: Nurse Practitioner

## 2021-09-21 VITALS — BP 130/88 | HR 74 | Ht 62.0 in | Wt 180.6 lb

## 2021-09-21 DIAGNOSIS — R0602 Shortness of breath: Secondary | ICD-10-CM | POA: Diagnosis not present

## 2021-09-21 DIAGNOSIS — G453 Amaurosis fugax: Secondary | ICD-10-CM

## 2021-09-21 DIAGNOSIS — I10 Essential (primary) hypertension: Secondary | ICD-10-CM | POA: Diagnosis not present

## 2021-09-21 DIAGNOSIS — E785 Hyperlipidemia, unspecified: Secondary | ICD-10-CM

## 2021-09-21 DIAGNOSIS — I70213 Atherosclerosis of native arteries of extremities with intermittent claudication, bilateral legs: Secondary | ICD-10-CM

## 2021-09-21 DIAGNOSIS — I517 Cardiomegaly: Secondary | ICD-10-CM | POA: Diagnosis not present

## 2021-09-21 NOTE — Patient Instructions (Addendum)
Medication Instructions:  Your physician recommends that you continue on your current medications as directed. Please refer to the Current Medication list given to you today.  *If you need a refill on your cardiac medications before your next appointment, please call your pharmacy*   Lab Work: None ordered  If you have labs (blood work) drawn today and your tests are completely normal, you will receive your results only by: [place] (if you have MyChart) OR A paper copy in the mail If you have any lab test that is abnormal or we need to change your treatment, we will call you to review the results.   Testing/Procedures: None ordered   Follow-Up: At [facility], you and your health needs are our priority.  As part of our continuing mission to provide you with exceptional heart care, we have created designated Provider Care Teams.  These Care Teams include your primary Cardiologist (physician) and Advanced Practice Providers (APPs -  Physician Assistants and Nurse Practitioners) who all work together to provide you with the care you need, when you need it.  We recommend signing up for the patient portal called "MyChart".  Sign up information is provided on this After Visit Summary.  MyChart is used to connect with patients for Virtual Visits (Telemedicine).  Patients are able to view lab/test results, encounter notes, upcoming appointments, etc.  Non-urgent messages can be sent to your provider as well.   To learn more about what you can do with MyChart, go to [email or web address].    Your next appointment:   12 month(s)  The format for your next appointment:   In Person  Provider:   Candee Furbish, MD     Other Instructions Your physician has requested that you regularly monitor and record your blood pressure readings at home. Please use the same machine at the same time of day to check your readings and record them to bring to your follow-up visit.   Please monitor  blood pressures and keep a log of your readings for 2-3 weeks then sen in a mychart message with those readings or call the office, [phone]..    Make sure to check 2 hours after your medications.    AVOID these things for 30 minutes before checking your blood pressure: No Drinking caffeine. No Drinking alcohol. No Eating. No Smoking. No Exercising.   Five minutes before checking your blood pressure: Pee. Sit in a dining chair. Avoid sitting in a soft couch or armchair. Be quiet. Do not talk   Adopting a Healthy Lifestyle.   Weight: Know what a healthy weight is for you (roughly BMI <25) and aim to maintain this. You can calculate your body mass index on your smart phone  Diet: Aim for 7+ servings of fruits and vegetables daily Limit animal fats in diet for cholesterol and heart health - choose grass fed whenever available Avoid highly processed foods (fast food burgers, tacos, fried chicken, pizza, hot dogs, french fries)  Saturated fat comes in the form of butter, lard, coconut oil, margarine, partially hydrogenated oils, and fat in meat. These increase your risk of cardiovascular disease.  Use healthy plant oils, such as olive, canola, soy, corn, sunflower and peanut.  Whole foods such as fruits, vegetables and whole grains have fiber  Men need > 38 grams of fiber per day Women need > 25 grams of fiber per day  Load up on vegetables and fruits - one-half of your plate: Aim for color and variety, and remember that  potatoes dont count. Go for whole grains - one-quarter of your plate: Whole wheat, barley, wheat berries, quinoa, oats, brown rice, and foods made with them. If you want pasta, go with whole wheat pasta. Protein power - one-quarter of your plate: Fish, chicken, beans, and nuts are all healthy, versatile protein sources. Limit red meat. You need carbohydrates for energy! The type of carbohydrate is more important than the amount. Choose carbohydrates such as  vegetables, fruits, whole grains, beans, and nuts in the place of white rice, white pasta, potatoes (baked or fried), macaroni and cheese, cakes, cookies, and donuts.  If youre thirsty, drink water. Coffee and tea are good in moderation, but skip sugary drinks and limit milk and dairy products to one or two daily servings. Keep sugar intake at 6 teaspoons or 24 grams or LESS       Exercise: Aim for 150 min of moderate intensity exercise weekly for heart health, and weights twice weekly for bone health Stay active - any steps are better than no steps! Aim for 7-9 hours of sleep daily          Important Information About Sugar

## 2021-09-25 ENCOUNTER — Ambulatory Visit (INDEPENDENT_AMBULATORY_CARE_PROVIDER_SITE_OTHER): Payer: Medicare Other | Admitting: Orthopedic Surgery

## 2021-09-25 ENCOUNTER — Ambulatory Visit: Payer: Self-pay

## 2021-09-25 ENCOUNTER — Ambulatory Visit (INDEPENDENT_AMBULATORY_CARE_PROVIDER_SITE_OTHER): Payer: Medicare Other

## 2021-09-25 DIAGNOSIS — I70213 Atherosclerosis of native arteries of extremities with intermittent claudication, bilateral legs: Secondary | ICD-10-CM | POA: Diagnosis not present

## 2021-09-25 DIAGNOSIS — G8929 Other chronic pain: Secondary | ICD-10-CM | POA: Diagnosis not present

## 2021-09-25 DIAGNOSIS — M25561 Pain in right knee: Secondary | ICD-10-CM

## 2021-09-25 DIAGNOSIS — M25562 Pain in left knee: Secondary | ICD-10-CM | POA: Diagnosis not present

## 2021-09-26 ENCOUNTER — Encounter: Payer: Self-pay | Admitting: Orthopedic Surgery

## 2021-09-26 NOTE — Progress Notes (Signed)
Office Visit Note   Patient: Brittney Williamson           Date of Birth: 1950-10-02           MRN: [id number] Visit Date: 09/25/2021              Requested by: [name], [name], MD [address] [place],  [place] [address] PCP: [name], [name], MD  Chief Complaint  Patient presents with   Left Knee - Pain   Right Knee - Pain      HPI: Patient is a 71 year old woman who presents with osteoarthritis bilateral knees.  She is status post injection of both knees in July 2020 she states that lasted about 2 days.  She states she is not interested in additional injections.  She complains of start up stiffness.  Patient has a history of peripheral artery disease with claudication in the right lower extremity.  Patient has consulted Dr. Carlis Abbott with vascular surgery.  Patient states that she is also getting a second opinion at [facility].  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    Assessment & Plan: Visit Diagnoses:  1. Chronic pain of both knees     Plan: Recommended she continue her work-up for her vascular claudication.  Would not recommend surgical intervention for her knees at this time.  Strengthening and exercise could be helpful.  Follow-Up Instructions: No follow-ups on file.   Ortho Exam  Patient is alert, oriented, no adenopathy, well-dressed, normal affect, normal respiratory effort. Examination patient has crepitation with range of motion of both knees there is no effusion no redness no cellulitis.  Collaterals and cruciates are stable.  Primarily tender to palpation over the medial joint line.  Imaging: No results found. No images are attached to the encounter.  Labs: Lab Results  Component Value Date   HGBA1C 6.5 (H) 07/25/2021   HGBA1C 6.4 (H) 01/23/2021   REPTSTATUS 11/26/2020 FINAL 11/23/2020   CULT FEW STREPTOCOCCUS,BETA HEMOLYTIC NOT GROUP A 11/23/2020   LABORGA Normal Upper Respiratory [place] 07/25/2011   LABORGA No  Beta Hemolytic Streptococci Isolated 07/25/2011     Lab Results  Component Value Date   ALBUMIN 4.5 01/23/2021   ALBUMIN 4.4 11/06/2019   ALBUMIN 4.5 10/05/2019    No results found for: MG No results found for: VD25OH  No results found for: PREALBUMIN    Latest Ref Rng & Units 01/23/2021    4:29 PM 09/05/2016    4:14 PM 11/18/2015    8:11 AM  CBC EXTENDED  WBC 3.4 - 10.8 x10E3/uL 6.6   6.3   5.4    RBC 3.77 - 5.28 x10E6/uL 5.16   5.33   5.33    Hemoglobin 11.1 - 15.9 g/dL 14.0   14.7   14.3    HCT 34.0 - 46.6 % 44.1   45.4   44.0    Platelets 150 - 450 x10E3/uL 302   231   239    NEUT# 1.4 - 7.0 x10E3/uL 3.4      Lymph# 0.7 - 3.1 x10E3/uL 2.3         There is no height or weight on file to calculate BMI.  Orders:  Orders Placed This Encounter  Procedures   XR Knee 1-2 Views Right   XR Knee 1-2 Views Left   No orders of the defined types were placed in this encounter.    Procedures: No procedures performed  Clinical Data: No additional findings.  ROS:  All other systems negative, except as noted in the HPI. Review of Systems  Objective: Vital Signs: There were no vitals taken for this visit.  Specialty Comments:  No specialty comments available.  PMFS History: Patient Active Problem List   Diagnosis Date Noted   Diabetes mellitus ([place]) 09/15/2021   Knee osteoarthritis 07/24/2021   Atherosclerosis of native arteries of extremity with intermittent claudication ([place]) 04/18/2021   Carotid artery plaque, right 03/23/2021   Leg pain 03/23/2021   Amaurosis fugax 03/23/2021   Obesity, Class I, BMI 30-34.9 01/23/2021   Phase of life problem 01/23/2021   Osteopenia 11/10/2017   OSA on CPAP 01/12/2016   Insomnia with sleep apnea 01/12/2016   Essential hypertension 11/30/2015   Mixed hyperlipidemia 11/30/2015   Past Medical History:  Diagnosis Date   Mitral valve prolapse 1993   OSA on CPAP     Family History  Adopted: Yes    Past Surgical History:   Procedure Laterality Date   KNEE ARTHROSCOPY WITH MEDIAL MENISECTOMY Left 05/27/2019   WRIST SURGERY Right 2017   Social History   Occupational History   Not on file  Tobacco Use   Smoking status: Never   Smokeless tobacco: Never  Substance and Sexual Activity   Alcohol use: Yes    Comment: occ   Drug use: Never   Sexual activity: Not Currently

## 2021-10-26 ENCOUNTER — Ambulatory Visit (HOSPITAL_BASED_OUTPATIENT_CLINIC_OR_DEPARTMENT_OTHER): Payer: Medicare Other | Admitting: Family Medicine

## 2021-10-27 ENCOUNTER — Ambulatory Visit (HOSPITAL_BASED_OUTPATIENT_CLINIC_OR_DEPARTMENT_OTHER): Payer: Medicare Other | Admitting: Family Medicine

## 2021-11-10 ENCOUNTER — Ambulatory Visit (INDEPENDENT_AMBULATORY_CARE_PROVIDER_SITE_OTHER): Payer: Medicare Other | Admitting: Family Medicine

## 2021-11-10 ENCOUNTER — Encounter (HOSPITAL_BASED_OUTPATIENT_CLINIC_OR_DEPARTMENT_OTHER): Payer: Self-pay | Admitting: Family Medicine

## 2021-11-10 VITALS — BP 168/88 | HR 78 | Ht 62.0 in | Wt 185.9 lb

## 2021-11-10 DIAGNOSIS — E119 Type 2 diabetes mellitus without complications: Secondary | ICD-10-CM

## 2021-11-10 DIAGNOSIS — E669 Obesity, unspecified: Secondary | ICD-10-CM | POA: Diagnosis not present

## 2021-11-10 DIAGNOSIS — I10 Essential (primary) hypertension: Secondary | ICD-10-CM | POA: Diagnosis not present

## 2021-11-10 LAB — POCT GLYCOSYLATED HEMOGLOBIN (HGB A1C): Hemoglobin A1C: 6.4 % — AB (ref 4.0–5.6)

## 2021-11-10 NOTE — Progress Notes (Signed)
    Procedures performed today:    None.  Independent interpretation of notes and tests performed by another provider:   None.  Brief History, Exam, Impression, and Recommendations:    BP (!) 168/88   Pulse 78   Ht '5\' 2"'$  (1.575 m)   Wt 185 lb 14.4 oz (84.3 kg)   SpO2 100%   BMI 34.00 kg/m   Essential hypertension Blood pressure is elevated in office today.  She reports checking at home and that home readings have been better controlled.  She did have visit with cardiology last month and it was better controlled in the office today.  Denies any issues with chest pain or headaches Unfortunately, she continues with higher sodium foods, fast food.  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  This was a few months ago, due for recheck today Previously discussed pharmacotherapy, she declined this previously, again discussed metformin and she is not wanting to take any other medications at this time She is due for pneumococcal vaccination both related to age and diagnosis of diabetes, discussed indications for this and strong recommendations to proceed with immunization, she declines today Will refer to ophthalmology to ensure that retinopathy screening is completed Prior nephropathy screening was reassuring Plan to complete foot exam at upcoming visit  Obesity, Class I, BMI 30-34.9 Reports that she has been trying to adjust her diet and work on weight loss but has been unsuccessful.  She does report eating more fruits and vegetables.  Unfortunately, she also indicates that she is eating out regularly, eating a lot of fast food on workdays Discussed dietary  recommendations, reducing fast food intake, trying to bring healthier options with her to work  Return in about 3 months (around 02/10/2022) for HTN, DM.   ___________________________________________ Jobanny Mavis [name], MD, ABFM, [facility] and [place]

## 2021-11-10 NOTE — Assessment & Plan Note (Signed)
Blood pressure is elevated in office today.  She reports checking at home and that home readings have been better controlled.  She did have visit with cardiology last month and it was better controlled in the office today.  Denies any issues with chest pain or headaches Unfortunately, she continues with higher sodium foods, fast food.  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

## 2021-11-10 NOTE — Assessment & Plan Note (Signed)
Most recent hemoglobin A1c found to be 6.5%.  This was a few months ago, due for recheck today Previously discussed pharmacotherapy, she declined this previously, again discussed metformin and she is not wanting to take any other medications at this time She is due for pneumococcal vaccination both related to age and diagnosis of diabetes, discussed indications for this and strong recommendations to proceed with immunization, she declines today Will refer to ophthalmology to ensure that retinopathy screening is completed Prior nephropathy screening was reassuring Plan to complete foot exam at upcoming visit

## 2021-11-10 NOTE — Patient Instructions (Signed)
  Medication Instructions:  Your physician recommends that you continue on your current medications as directed. Please refer to the Current Medication list given to you today. --If you need a refill on any your medications before your next appointment, please call your pharmacy first. If no refills are authorized on file call the office.-- Lab Work: Your physician has recommended that you have lab work today: Yes If you have labs (blood work) drawn today and your tests are completely normal, you will receive your results via MyChart message OR a phone call from our staff.  Please ensure you check your voicemail in the event that you authorized detailed messages to be left on a delegated number. If you have any lab test that is abnormal or we need to change your treatment, we will call you to review the results.  Referrals/Procedures/Imaging: No  Follow-Up: Your next appointment:   Your physician recommends that you schedule a follow-up appointment in: 4 months with Dr. de [place].  You will receive a text message or e-mail with a link to a survey about your care and experience with us today! We would greatly appreciate your feedback!   Thanks for letting us be apart of your health journey!!  Primary Care and Sports Medicine   Dr. Raymond de [place]   We encourage you to activate your patient portal called "MyChart".  Sign up information is provided on this After Visit Summary.  MyChart is used to connect with patients for Virtual Visits (Telemedicine).  Patients are able to view lab/test results, encounter notes, upcoming appointments, etc.  Non-urgent messages can be sent to your provider as well. To learn more about what you can do with MyChart, please visit --  [email or web address].    

## 2021-11-10 NOTE — Assessment & Plan Note (Signed)
Reports that she has been trying to adjust her diet and work on weight loss but has been unsuccessful.  She does report eating more fruits and vegetables.  Unfortunately, she also indicates that she is eating out regularly, eating a lot of fast food on workdays Discussed dietary recommendations, reducing fast food intake, trying to bring healthier options with her to work

## 2021-12-18 ENCOUNTER — Other Ambulatory Visit: Payer: Self-pay

## 2021-12-18 ENCOUNTER — Other Ambulatory Visit: Payer: Self-pay | Admitting: Cardiology

## 2021-12-18 MED ORDER — FENOFIBRATE 145 MG PO TABS: 145.0000 mg | ORAL_TABLET | Freq: Every day | ORAL | 3 refills | Status: DC

## 2021-12-18 NOTE — Telephone Encounter (Signed)
Brittney Williamson is requesting a refill on fenofibrate 145 mg tablets. This medication was D/C before because of side effects. Would Dr. [name] like to refill this medication? Please address

## 2022-01-05 DIAGNOSIS — H43813 Vitreous degeneration, bilateral: Secondary | ICD-10-CM | POA: Diagnosis not present

## 2022-01-15 ENCOUNTER — Ambulatory Visit (HOSPITAL_COMMUNITY)
Admission: RE | Admit: 2022-01-15 | Discharge: 2022-01-15 | Disposition: A | Discharge status: Home / Self Care | Payer: Medicare Other | Source: Ambulatory Visit | Attending: Surgery | Admitting: Surgery

## 2022-01-15 DIAGNOSIS — R152 Fecal urgency: Secondary | ICD-10-CM | POA: Diagnosis not present

## 2022-01-15 DIAGNOSIS — I70211 Atherosclerosis of native arteries of extremities with intermittent claudication, right leg: Secondary | ICD-10-CM | POA: Insufficient documentation

## 2022-01-15 DIAGNOSIS — Z86010 Personal history of colonic polyps: Secondary | ICD-10-CM | POA: Diagnosis not present

## 2022-01-16 ENCOUNTER — Encounter: Payer: Self-pay | Admitting: Vascular Surgery

## 2022-01-16 ENCOUNTER — Ambulatory Visit (INDEPENDENT_AMBULATORY_CARE_PROVIDER_SITE_OTHER): Payer: Medicare Other | Admitting: Vascular Surgery

## 2022-01-16 VITALS — BP 124/77 | HR 78 | Temp 97.4°F | Resp 16 | Ht 62.5 in | Wt 185.0 lb

## 2022-01-16 DIAGNOSIS — I70213 Atherosclerosis of native arteries of extremities with intermittent claudication, bilateral legs: Secondary | ICD-10-CM

## 2022-01-16 NOTE — Progress Notes (Signed)
Patient name: Brittney Williamson MRN: [id number] DOB: 06-28-1950 Sex: female  REASON FOR CONSULT: 6 month follow-up right lower extremity claudication  HPI: Brittney Williamson is a 71 y.o. female, with history of hypertension and lower extremity claudication that presents for 6 month follow-up of lower extremity claudication.  She previously described burning and a charley horse in the right calf when she walks.  We recommended conservative therapy at the initial visit.  Now having no claudication in the right leg.  No further calf burning.  She remains concerned about swelling in both legs.  Past Medical History:  Diagnosis Date   Mitral valve prolapse 1993   OSA on CPAP     Past Surgical History:  Procedure Laterality Date   KNEE ARTHROSCOPY WITH MEDIAL MENISECTOMY Left 05/27/2019   WRIST SURGERY Right 2017    Family History  Adopted: Yes    SOCIAL HISTORY: Social History   Socioeconomic History   Marital status: Single    Spouse name: Not on file   Number of children: Not on file   Years of education: Not on file   Highest education level: Not on file  Occupational History   Not on file  Tobacco Use   Smoking status: Never   Smokeless tobacco: Never  Substance and Sexual Activity   Alcohol use: Yes    Comment: occ   Drug use: Never   Sexual activity: Not Currently  Other Topics Concern   Not on file  Social History Narrative   Not on file   Social Determinants of Health   Financial Resource Strain: Not on file  Food Insecurity: Not on file  Transportation Needs: Not on file  Physical Activity: Not on file  Stress: Not on file  Social Connections: Not on file  Intimate Partner Violence: Not on file    Allergies  Allergen Reactions   Penicillins Itching   Statins Other (See Comments)    Myalgia and severe fatigue   Trimox [Amoxicillin Trihydrate] Itching    Current Outpatient Medications  Medication Sig Dispense Refill   co-enzyme Q-10 50 MG  capsule Take 50 mg by mouth daily.     ELDERBERRY PO Take 2 tablets by mouth daily.     fenofibrate (TRICOR) 145 MG tablet Take 1 tablet (145 mg total) by mouth daily. 90 tablet 3   rosuvastatin (CRESTOR) 10 MG tablet Take 1 tablet (10 mg total) by mouth at bedtime. 90 tablet 3   vitamin B-12 (CYANOCOBALAMIN) 1000 MCG tablet Take 1,000 mcg by mouth daily.     VITAMIN D PO Take 20,000 Units by mouth daily. Vitamin D3+K2     Zinc Gluconate 30 MG TABS Take by mouth.     cilostazol (PLETAL) 100 MG tablet Take 1 tablet (100 mg total) by mouth 2 (two) times daily. (Patient not taking: Reported on 01/16/2022) 60 tablet 11   Krill Oil 500 MG CAPS Take 1 capsule by mouth daily. (Patient not taking: Reported on 01/16/2022)     magnesium 30 MG tablet Take 30 mg by mouth 2 (two) times daily. (Patient not taking: Reported on 01/16/2022)     No current facility-administered medications for this visit.    REVIEW OF SYSTEMS:  '[X]'$  denotes positive finding, '[ ]'$  denotes negative finding Cardiac  Comments:  Chest pain or chest pressure:    Shortness of breath upon exertion:    Short of breath when lying flat:    Irregular heart rhythm:  Vascular    Pain in calf, thigh, or hip brought on by ambulation:    Pain in feet at night that wakes you up from your sleep:     Blood clot in your veins:    Leg swelling:  x       Pulmonary    Oxygen at home:    Productive cough:     Wheezing:         Neurologic    Sudden weakness in arms or legs:     Sudden numbness in arms or legs:     Sudden onset of difficulty speaking or slurred speech:    Temporary loss of vision in one eye:     Problems with dizziness:         Gastrointestinal    Blood in stool:     Vomited blood:         Genitourinary    Burning when urinating:     Blood in urine:        Psychiatric    Major depression:         Hematologic    Bleeding problems:    Problems with blood clotting too easily:        Skin    Rashes or  ulcers:        Constitutional    Fever or chills:      PHYSICAL EXAM: Vitals:   01/16/22 1212  BP: 124/77  Pulse: 78  Resp: 16  Temp: (!) 97.4 F (36.3 C)  TempSrc: Temporal  SpO2: 97%  Weight: 185 lb (83.9 kg)  Height: 5' 2.5" (1.588 m)    GENERAL: The patient is a well-nourished female, in no acute distress. The vital signs are documented above. CARDIAC: There is a regular rate and rhythm.  VASCULAR:  Palpable femoral pulses bilaterally No palpable pedal pulses No lower extremity tissue loss Brisk DP signal on the right and PT signal in the left PULMONARY: No respiratory distress ABDOMEN: Soft and non-tender. MUSCULOSKELETAL: There are no major deformities or cyanosis. NEUROLOGIC: No focal weakness or paresthesias are detected. PSYCHIATRIC: The patient has a normal affect.  DATA:   ABI's today 0.69 right monophasic and 0.93 left biphasic (stable)  Lower extremity arterial duplex 04/05/2021 shows occlusion of the distal right popliteal artery and TP trunk.  Left lower extremity has a total occlusion of the anterior tibial and segmental occlusion of the posterior tibial.   Assessment/Plan:  71 year old female presents for 38-monthfollow-up of her right lower extremity claudication.  Discussed her ABIs are stable with conservative therapy over the past 6 months.  Discussed no role for surgical intervention given that she is no longer having claudication symptoms in her right leg.  She is concerned about leg swelling and discussed this is a separate problem.  I have recommended leg elevation with compression stockings which she is already doing.  I will see her in 1 year with ABIs.   CMarty Heck MD [facility] GWashingtonOffice: [phone]

## 2022-01-21 HISTORY — PX: COLONOSCOPY: SHX174

## 2022-02-12 ENCOUNTER — Ambulatory Visit (INDEPENDENT_AMBULATORY_CARE_PROVIDER_SITE_OTHER): Payer: Medicare Other | Admitting: Family Medicine

## 2022-02-12 ENCOUNTER — Encounter (HOSPITAL_BASED_OUTPATIENT_CLINIC_OR_DEPARTMENT_OTHER): Payer: Self-pay | Admitting: Family Medicine

## 2022-02-12 ENCOUNTER — Ambulatory Visit (HOSPITAL_BASED_OUTPATIENT_CLINIC_OR_DEPARTMENT_OTHER): Payer: 59 | Admitting: Family Medicine

## 2022-02-12 VITALS — BP 171/90 | HR 76 | Ht 62.0 in | Wt 189.1 lb

## 2022-02-12 DIAGNOSIS — M79604 Pain in right leg: Secondary | ICD-10-CM

## 2022-02-12 DIAGNOSIS — I10 Essential (primary) hypertension: Secondary | ICD-10-CM

## 2022-02-12 DIAGNOSIS — E782 Mixed hyperlipidemia: Secondary | ICD-10-CM

## 2022-02-12 DIAGNOSIS — M79605 Pain in left leg: Secondary | ICD-10-CM | POA: Diagnosis not present

## 2022-02-12 DIAGNOSIS — E119 Type 2 diabetes mellitus without complications: Secondary | ICD-10-CM

## 2022-02-12 LAB — COMPREHENSIVE METABOLIC PANEL
ALT: 28 IU/L (ref 0–32)
AST: 25 IU/L (ref 0–40)
Albumin/Globulin Ratio: 1.8 (ref 1.2–2.2)
Albumin: 4.5 g/dL (ref 3.9–4.9)
Alkaline Phosphatase: 66 IU/L (ref 44–121)
BUN/Creatinine Ratio: 19 (ref 12–28)
BUN: 16 mg/dL (ref 8–27)
Bilirubin Total: 0.3 mg/dL (ref 0.0–1.2)
CO2: 23 mmol/L (ref 20–29)
Calcium: 9.6 mg/dL (ref 8.7–10.3)
Chloride: 104 mmol/L (ref 96–106)
Creatinine, Ser: 0.84 mg/dL (ref 0.57–1.00)
Globulin, Total: 2.5 g/dL (ref 1.5–4.5)
Glucose: 103 mg/dL — ABNORMAL HIGH (ref 70–99)
Potassium: 4.3 mmol/L (ref 3.5–5.2)
Sodium: 141 mmol/L (ref 134–144)
Total Protein: 7 g/dL (ref 6.0–8.5)
eGFR: 75 mL/min/{1.73_m2} (ref >59)

## 2022-02-12 LAB — HEMOGLOBIN A1C
Est. average glucose Bld gHb Est-mCnc: 151 mg/dL
Hgb A1c MFr Bld: 6.9 % — ABNORMAL HIGH (ref 4.8–5.6)

## 2022-02-12 LAB — LIPID PANEL
Chol/HDL Ratio: 3.4 ratio (ref 0.0–4.4)
Cholesterol, Total: 112 mg/dL (ref 100–199)
HDL: 33 mg/dL — ABNORMAL LOW (ref >39)
LDL Chol Calc (NIH): 44 mg/dL (ref 0–99)
Triglycerides: 220 mg/dL — ABNORMAL HIGH (ref 0–149)
VLDL Cholesterol Cal: 35 mg/dL (ref 5–40)

## 2022-02-12 NOTE — Patient Instructions (Signed)
  Medication Instructions:  Your physician recommends that you continue on your current medications as directed. Please refer to the Current Medication list given to you today. --If you need a refill on any your medications before your next appointment, please call your pharmacy first. If no refills are authorized on file call the office.-- Lab Work: Your physician has recommended that you have lab work today: 02/12/2022 If you have labs (blood work) drawn today and your tests are completely normal, you will receive your results via [place] a phone call from our staff.  Please ensure you check your voicemail in the event that you authorized detailed messages to be left on a delegated number. If you have any lab test that is abnormal or we need to change your treatment, we will call you to review the results.    Follow-Up: Your next appointment:   Your physician recommends that you schedule a follow-up appointment in: 2-3 months with Dr. [name]  You will receive a text message or e-mail with a link to a survey about your care and experience with [place] today! We would greatly appreciate your feedback!   Thanks for letting us be apart of your health journey!!  Primary Care and Sports Medicine   Dr. Arlina Robes [place]   We encourage you to activate your patient portal called "MyChart".  Sign up information is provided on this After Visit Summary.  MyChart is used to connect with patients for Virtual Visits (Telemedicine).  Patients are able to view lab/test results, encounter notes, upcoming appointments, etc.  Non-urgent messages can be sent to your provider as well. To learn more about what you can do with MyChart, please visit --  [email or web address].

## 2022-02-12 NOTE — Progress Notes (Signed)
    Procedures performed today:    None.  Independent interpretation of notes and tests performed by another provider:   None.  Brief History, Exam, Impression, and Recommendations:    BP (!) 171/90 Comment: 147/93  Pulse 76   Ht '5\' 2"'$  (1.575 m)   Wt 189 lb 1.6 oz (85.8 kg)   SpO2 99%   BMI 34.59 kg/m   Essential hypertension Initial blood pressure reading is elevated in office today.  She reports that she does check occasionally home and that readings are typically around 130/80.  She has had elevated readings at office visits in the past both here and with specialists.  She does follow with cardiology.  Has been resistant to starting medication to assist with lowering her blood pressure.  She does occasionally have some dietary indiscretions, she will work about 3 to 4 days a week and tends to consume fast food on those days. Reviewed considerations today including possible initiation of antihypertensive.  Patient is still reluctant to start any additional medications. At last visit with cardiology, they had advised outpatient setting message with home blood pressure readings for review, however does not appear that this was done. Given in office elevations, recommend that she bring her home blood pressure cuff in to appointments to be able to compare home blood pressure cuff with in office reading to determine accuracy of home cuff Recommend continuing with intermittent monitoring, DASH diet  Diabetes mellitus ([name]) Hemoglobin A1c has been at goal recently She continues with lifestyle modifications, does not want to begin medications. She also continues to decline pneumococcal vaccination despite recommendations She does have a ophthalmologist that she follows with, overdue for follow-up regarding retinopathy screening Up-to-date with nephropathy screening, most recent screening was reassuring We will check hemoglobin A1c to assess control  Leg pain She has had follow-up visit  with vascular surgeon, Dr. Carlis Abbott.  On review of chart, it appears that patient had noted improvement in leg pain/symptoms of claudication, still was reporting some leg swelling.  Today, she indicates that she does still have occasional pain and that she feels that the decrease in her claudication symptoms is due to decreased activity.  She suggests that the pain she was experiencing has led to gradual decrease in activity Discussed with patient today that if she does feel that the claudication symptoms that she was experiencing have led to notable changes in her desired activities in order to avoid eliciting pain, then it likely would be worth further discussing this with the vascular surgeon.  Mixed hyperlipidemia Patient continues with rosuvastatin and fenofibrate.  Last follow-up with cardiology was in June.  Most recent lipid panel was about 6 months ago and was at goal and notably improved from the prior lipid panel. We will proceed with check of a lipid panel today She did have questions today about possibly being able to discontinue some medications including statin therapy.  Discussed with patient recommendation for continuing with statin therapy due to her underlying risk of heart attack or stroke related to risk factors including age, history of diabetes, history of hypertension  Return in about 3 months (around 05/15/2022) for HTN, DM.   ___________________________________________ Brittney Riesen [name], MD, ABFM, [facility] and [place]

## 2022-02-12 NOTE — Assessment & Plan Note (Addendum)
Hemoglobin A1c has been at goal recently She continues with lifestyle modifications, does not want to begin medications. She also continues to decline pneumococcal vaccination despite recommendations She does have a ophthalmologist that she follows with, overdue for follow-up regarding retinopathy screening Up-to-date with nephropathy screening, most recent screening was reassuring We will check hemoglobin A1c to assess control

## 2022-02-12 NOTE — Assessment & Plan Note (Signed)
Initial blood pressure reading is elevated in office today.  She reports that she does check occasionally home and that readings are typically around 130/80.  She has had elevated readings at office visits in the past both here and with specialists.  She does follow with cardiology.  Has been resistant to starting medication to assist with lowering her blood pressure.  She does occasionally have some dietary indiscretions, she will work about 3 to 4 days a week and tends to consume fast food on those days. Reviewed considerations today including possible initiation of antihypertensive.  Patient is still reluctant to start any additional medications. At last visit with cardiology, they had advised outpatient setting message with home blood pressure readings for review, however does not appear that this was done. Given in office elevations, recommend that she bring her home blood pressure cuff in to appointments to be able to compare home blood pressure cuff with in office reading to determine accuracy of home cuff Recommend continuing with intermittent monitoring, DASH diet

## 2022-02-12 NOTE — Assessment & Plan Note (Addendum)
Patient continues with rosuvastatin and fenofibrate.  Last follow-up with cardiology was in June.  Most recent lipid panel was about 6 months ago and was at goal and notably improved from the prior lipid panel. We will proceed with check of a lipid panel today She did have questions today about possibly being able to discontinue some medications including statin therapy.  Discussed with patient recommendation for continuing with statin therapy due to her underlying risk of heart attack or stroke related to risk factors including age, history of diabetes, history of hypertension

## 2022-02-12 NOTE — Assessment & Plan Note (Signed)
She has had follow-up visit with vascular surgeon, Dr. Carlis Abbott.  On review of chart, it appears that patient had noted improvement in leg pain/symptoms of claudication, still was reporting some leg swelling.  Today, she indicates that she does still have occasional pain and that she feels that the decrease in her claudication symptoms is due to decreased activity.  She suggests that the pain she was experiencing has led to gradual decrease in activity Discussed with patient today that if she does feel that the claudication symptoms that she was experiencing have led to notable changes in her desired activities in order to avoid eliciting pain, then it likely would be worth further discussing this with the vascular surgeon.

## 2022-02-13 DIAGNOSIS — K5730 Diverticulosis of large intestine without perforation or abscess without bleeding: Secondary | ICD-10-CM | POA: Diagnosis not present

## 2022-02-13 DIAGNOSIS — K649 Unspecified hemorrhoids: Secondary | ICD-10-CM | POA: Diagnosis not present

## 2022-02-13 DIAGNOSIS — Z86010 Personal history of colonic polyps: Secondary | ICD-10-CM | POA: Diagnosis not present

## 2022-02-13 DIAGNOSIS — Z09 Encounter for follow-up examination after completed treatment for conditions other than malignant neoplasm: Secondary | ICD-10-CM | POA: Diagnosis not present

## 2022-02-13 DIAGNOSIS — D124 Benign neoplasm of descending colon: Secondary | ICD-10-CM | POA: Diagnosis not present

## 2022-02-15 DIAGNOSIS — D124 Benign neoplasm of descending colon: Secondary | ICD-10-CM | POA: Diagnosis not present

## 2022-03-05 ENCOUNTER — Ambulatory Visit (HOSPITAL_BASED_OUTPATIENT_CLINIC_OR_DEPARTMENT_OTHER): Payer: 59 | Admitting: Family Medicine

## 2022-03-09 DIAGNOSIS — H2513 Age-related nuclear cataract, bilateral: Secondary | ICD-10-CM | POA: Diagnosis not present

## 2022-03-09 DIAGNOSIS — H04123 Dry eye syndrome of bilateral lacrimal glands: Secondary | ICD-10-CM | POA: Diagnosis not present

## 2022-03-09 DIAGNOSIS — H35371 Puckering of macula, right eye: Secondary | ICD-10-CM | POA: Diagnosis not present

## 2022-03-09 DIAGNOSIS — H33311 Horseshoe tear of retina without detachment, right eye: Secondary | ICD-10-CM | POA: Diagnosis not present

## 2022-03-20 IMAGING — MR MR KNEE*L* W/O CM
4 of 7 series · 21 of 40 positions shown · non-contrast
Comparison: MRI left knee dated April 27, 2019.

CLINICAL DATA: Left knee pain and inability to bear weight for the
past 2 weeks. No injury. Prior arthroscopic surgery he in May 2019.

EXAM:
MRI OF THE LEFT KNEE WITHOUT CONTRAST
TECHNIQUE: Multiplanar, multisequence MR imaging of the knee was performed. No
intravenous contrast was administered.

[Series 3: T2 fat-sat · axial · 4.0mm · 0.47mm/px · z∈[-81,+64]mm · 5 of 30 slices shown]
[im 1/30]
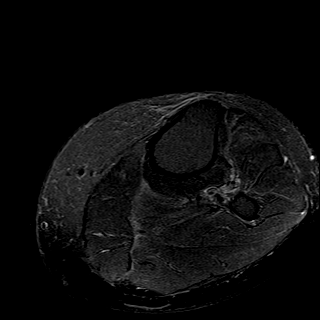
[im 8/30]
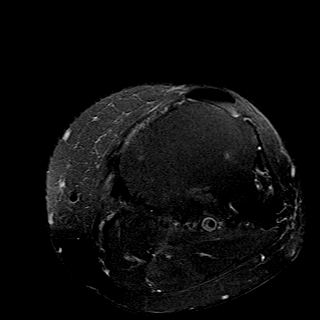
[im 15/30]
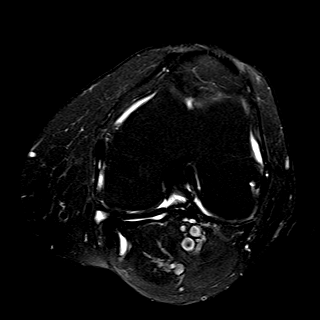
[im 22/30]
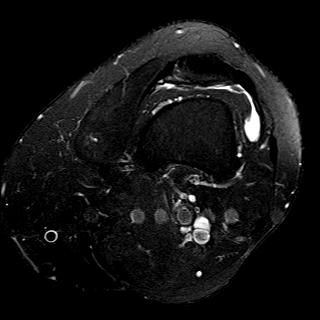
[im 30/30]
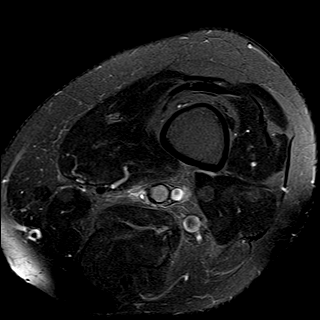

[Series 7: PD fat-sat · sagittal · 3.0mm · 0.31mm/px · 7 of 32 slices shown (1 of 3)]
[im 1/32]
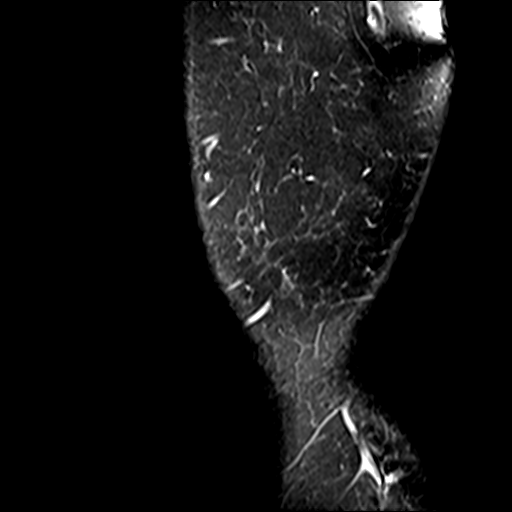
[im 6/32]
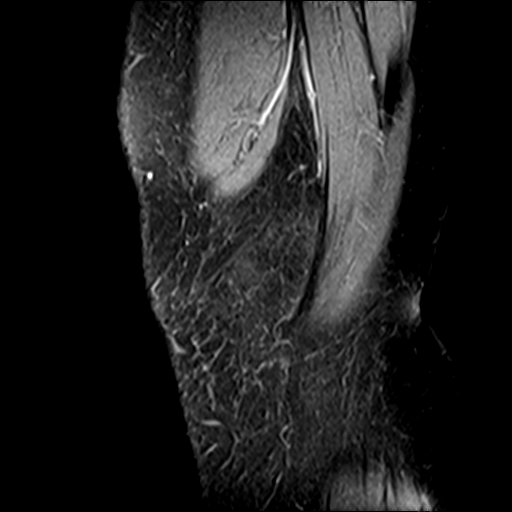
[im 11/32]
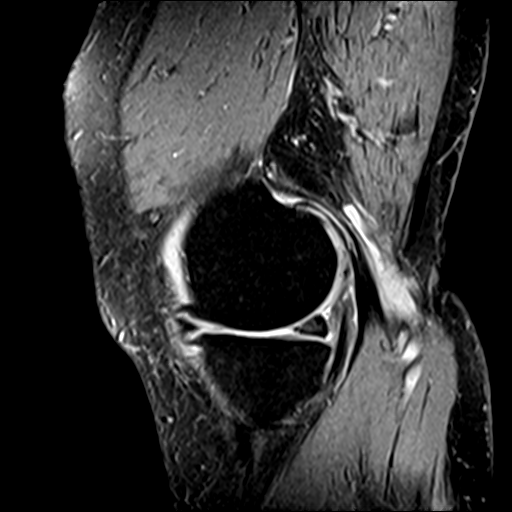
[im 16/32]
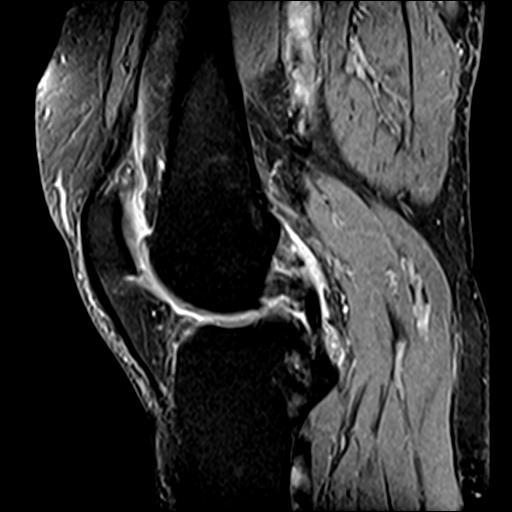
[im 21/32]
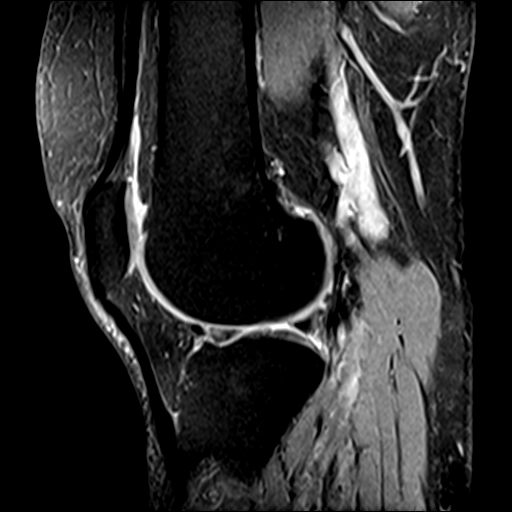
[im 26/32]
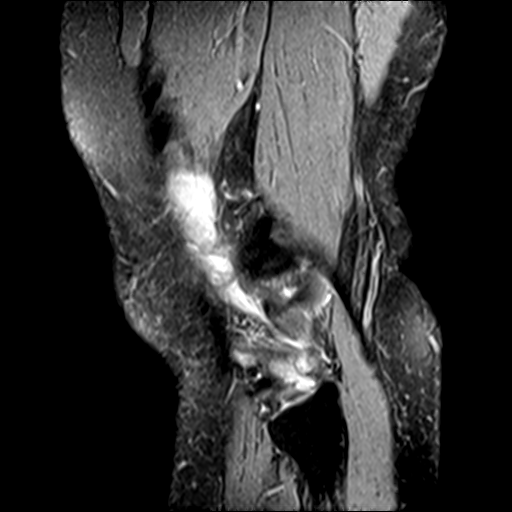
[im 32/32]
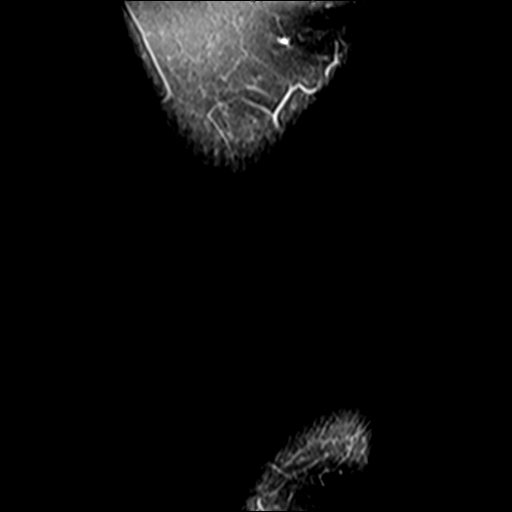

[Series 8: PD fat-sat · coronal · 3.0mm · 0.31mm/px · 7 of 33 slices shown (2 of 3)]
[im 1/33]
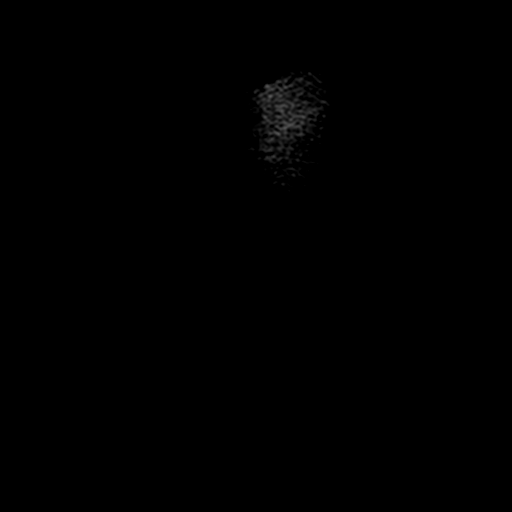
[im 6/33]
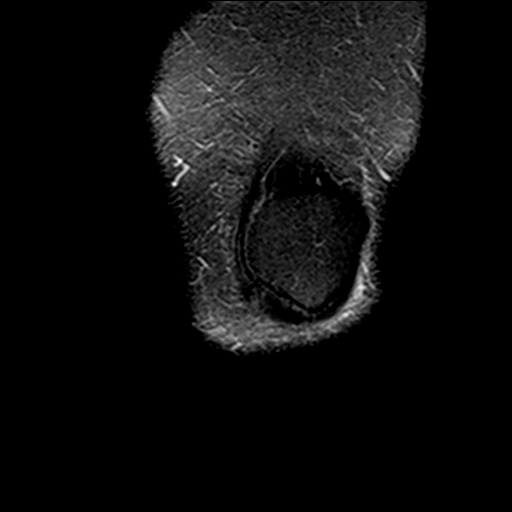
[im 11/33]
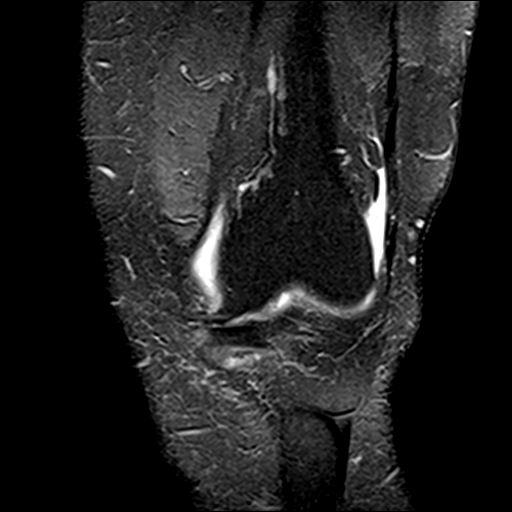
[im 17/33]
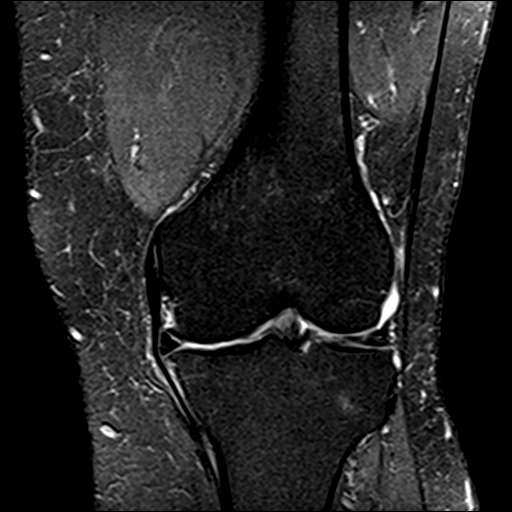
[im 22/33]
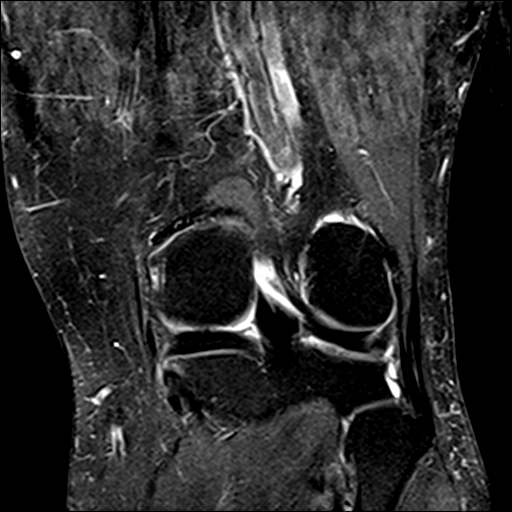
[im 27/33]
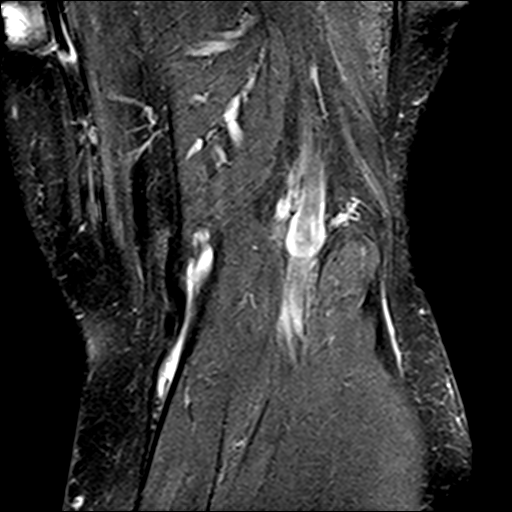
[im 33/33]
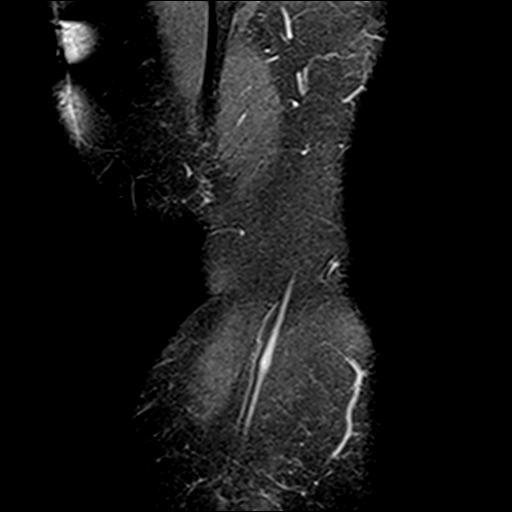

[Series 9: PD fat-sat · oblique · 2.0mm · 0.29mm/px · 2 of 11 slices shown (3 of 3)]
[im 1/11]
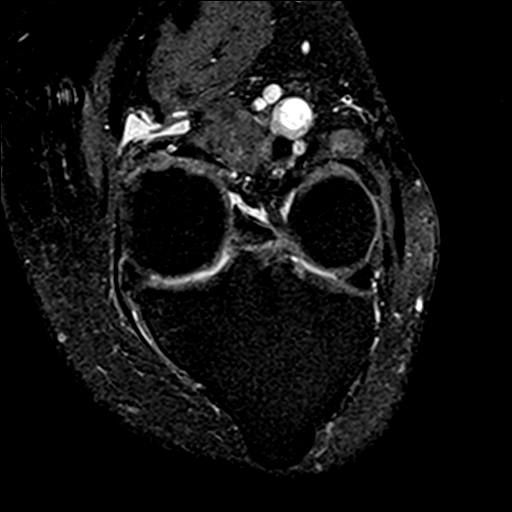
[im 11/11]
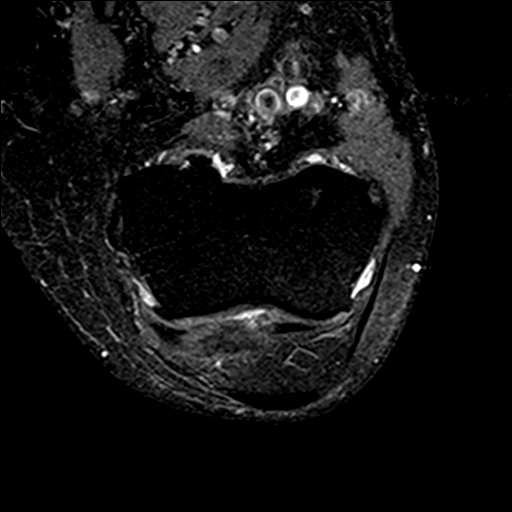

[21 of 40 positions shown; findings below may reference images not displayed]

FINDINGS: MENISCI

Medial meniscus: New complex tearing of the anterior horn. New
blunting of the anterior body free edge. New blunting of the
posterior horn free edge at the site prior radial tear, likely
postsurgical.

Lateral meniscus: Progressive intrasubstance degeneration of the
anterior horn and body with new free edge fraying at the anterior
horn/body junction.

LIGAMENTS

Cruciates:  Intact ACL and PCL.

Collaterals: Medial collateral ligament is intact. Lateral
collateral ligament complex is intact.

CARTILAGE

Patellofemoral: Progressive now full-thickness cartilage loss over
the trochlear groove.

Medial: Progressive now full-thickness cartilage loss over the
central weight-bearing medial femoral condyle. Progressive thinning
and partial-thickness cartilage loss over the weight-bearing lateral
femoral condyle.

Lateral:  Unchanged mild partial-thickness cartilage loss.

Joint:  Trace joint effusion.  Normal Hoffa's fat.

Popliteal Fossa: Decreased small Baker cyst. Intact popliteus
tendon.

Extensor Mechanism: Intact quadriceps tendon and patellar tendon.
Intact medial and lateral patellar retinaculum. Intact MPFL.

Bones: No acute fracture or dislocation. No suspicious bone lesion.
Small tricompartmental marginal osteophytes.

Other: None.
IMPRESSION: 1. New complex tearing of the medial meniscus anterior horn. New
blunting of the anterior body free edge which could reflect a radial
tear or interval partial meniscectomy. Postsurgical changes of the
posterior horn.
2. Progressive intrasubstance degeneration of the lateral meniscus
anterior horn and body with new free edge fraying at the anterior
horn/body junction.
3. Progressive tricompartmental osteoarthritis, worst in the medial
compartment.

## 2022-04-24 ENCOUNTER — Ambulatory Visit (HOSPITAL_COMMUNITY)
Admission: RE | Admit: 2022-04-24 | Discharge: 2022-04-24 | Disposition: A | Discharge status: Home / Self Care | Payer: Medicare Other | Source: Ambulatory Visit | Attending: Obstetrics & Gynecology | Admitting: Obstetrics & Gynecology

## 2022-04-24 ENCOUNTER — Other Ambulatory Visit (HOSPITAL_COMMUNITY): Payer: Self-pay | Admitting: Obstetrics & Gynecology

## 2022-04-24 DIAGNOSIS — D259 Leiomyoma of uterus, unspecified: Secondary | ICD-10-CM | POA: Diagnosis not present

## 2022-04-24 DIAGNOSIS — N950 Postmenopausal bleeding: Secondary | ICD-10-CM | POA: Diagnosis not present

## 2022-05-01 DIAGNOSIS — N950 Postmenopausal bleeding: Secondary | ICD-10-CM | POA: Diagnosis not present

## 2022-05-02 DIAGNOSIS — H33311 Horseshoe tear of retina without detachment, right eye: Secondary | ICD-10-CM | POA: Diagnosis not present

## 2022-05-02 DIAGNOSIS — H25813 Combined forms of age-related cataract, bilateral: Secondary | ICD-10-CM | POA: Diagnosis not present

## 2022-05-04 ENCOUNTER — Other Ambulatory Visit (HOSPITAL_COMMUNITY): Payer: Self-pay | Admitting: Obstetrics and Gynecology

## 2022-05-04 DIAGNOSIS — N950 Postmenopausal bleeding: Secondary | ICD-10-CM

## 2022-05-10 ENCOUNTER — Encounter (HOSPITAL_BASED_OUTPATIENT_CLINIC_OR_DEPARTMENT_OTHER): Payer: Self-pay | Admitting: Obstetrics and Gynecology

## 2022-05-10 NOTE — Progress Notes (Signed)
Spoke w/ via phone for pre-op interview--- pt Lab needs dos----  [facility], ekg             Lab results------ no COVID test -----patient states asymptomatic no test needed Arrive at ------- 0945 on 05-16-2022 NPO after MN NO Solid Food.  Clear liquids from MN until--- 0845 Med rec completed Medications to take morning of surgery ----- crestor, tricor Diabetic medication ----- n/a Patient instructed no nail polish to be worn day of surgery Patient instructed to bring photo id and insurance card day of surgery Patient aware to have Driver (ride ) / caregiver    for 24 hours after surgery -- friend, karen Patient Special Instructions ----- n/a Pre-Op special Istructions ----- n/a Patient verbalized understanding of instructions that were given at this phone interview. Patient denies shortness of breath, chest pain, fever, cough at this phone interview.

## 2022-05-15 ENCOUNTER — Ambulatory Visit (HOSPITAL_BASED_OUTPATIENT_CLINIC_OR_DEPARTMENT_OTHER): Payer: Medicare Other | Admitting: Family Medicine

## 2022-05-15 NOTE — Anesthesia Preprocedure Evaluation (Signed)
Anesthesia Evaluation  Patient identified by MRN, date of birth, ID band Patient awake    Reviewed: Allergy & Precautions, NPO status , Patient's Chart, lab work & pertinent test results  Airway Mallampati: II  TM Distance: >3 FB Neck ROM: Full    Dental no notable dental hx. (+) Teeth Intact, Dental Advisory Given   Pulmonary sleep apnea and Continuous Positive Airway Pressure Ventilation    Pulmonary exam normal breath sounds clear to auscultation       Cardiovascular hypertension, Normal cardiovascular exam Rhythm:Regular Rate:Normal     Neuro/Psych negative neurological ROS  negative psych ROS   GI/Hepatic negative GI ROS, Neg liver ROS,,,  Endo/Other  diabetes    Renal/GU      Musculoskeletal  (+) Arthritis ,    Abdominal  (+) + obese  Peds  Hematology   Anesthesia Other Findings All: Pcn, Amoxicillin, lovastatin  Reproductive/Obstetrics                             Anesthesia Physical Anesthesia Plan  ASA: 3  Anesthesia Plan: General   Post-op Pain Management: Precedex and Tylenol PO (pre-op)*   Induction: Intravenous  PONV Risk Score and Plan: Treatment may vary due to age or medical condition, Ondansetron and Midazolam  Airway Management Planned: LMA  Additional Equipment: None  Intra-op Plan:   Post-operative Plan:   Informed Consent: I have reviewed the patients History and Physical, chart, labs and discussed the procedure including the risks, benefits and alternatives for the proposed anesthesia with the patient or authorized representative who has indicated his/her understanding and acceptance.     Dental advisory given  Plan Discussed with: CRNA and Anesthesiologist  Anesthesia Plan Comments:         Anesthesia Quick Evaluation

## 2022-05-15 NOTE — H&P (Signed)
Brittney SABET is an 72 y.o. female P0 with Postmenopausal bleeding with 27m thickened endometrial stripe, endometrial mass with vascularity at fundus on [place] 1/2 exam with Dr. WStann Mainland "Abd - no masses felt but subjective tenderness lower abd and RLQ. Vulva - neg. Vault - not well visualized - there is a circular band of tissue about 2 cm from introitus - further up in vault there is question of vaginal tissue that is friable. Hard to feel uterus or see cervix. U/S by me suggests a normal sized uterus that has a thickened stripe and question of something in vaginal vault. "  04/24/22 Uterus    Measurements: 6.5 x 3 x 3.7 cm = volume: 38.2 mL. Fundal mass  measuring 2.1 x 1.6 x 1.9 cm.    Endometrium    Thickness: 12 mm. Heterogenous in appearance with moderate fluid  within. Suspected endometrial mass with vascularity at the fundus.    Right ovary    Measurements: 2.4 x 1.2 x 1.1 cm = volume: 1.7 mL. Normal  appearance/no adnexal mass.    Left ovary    Not seen     Menstrual History:  No LMP recorded. Patient is postmenopausal.    Past Medical History:  Diagnosis Date   Adopted    05-10-2022  per pt does not know biological family medical history   Amaurosis fugax of right eye 2021   Atherosclerosis of both lower extremities with intermittent claudication [place]    v[place]--- dr c. cCarlis Abbott  Chronically dry eyes, bilateral    Diet-controlled type 2 diabetes mellitus ([place]    Edema of both lower extremities    due to PAD   H/O mitral valve prolapse 1993   per pt told in 1993 had MVP,  per cardiologist--- dr sMarlou Porch, no evidence mvp per echo in epic 09-07-2019   History of adenomatous polyp of colon    History of retinal tear    horseshow tear right eye  s/p cryoablation   Hypertension    followed by pcp and cardiologist   (cardiac CT  04-21-2021  calcium score zero   Lactose intolerance    Low back pain    05-10-2022  per pt pain just started   Mixed hyperlipidemia     followed by pcp and cardiologist  (dr sMarlou Porch   OA (osteoarthritis)    OSA on CPAP 1993   followed by dr dBrett Fairy   first dx 1993;  restested 02-17-2016 in epic, AHI 45.7/hr   PMB (postmenopausal bleeding)    SUI (stress urinary incontinence, female)    Uterine fibroid     Past Surgical History:  Procedure Laterality Date   ANKLE ARTHROSCOPY Left    1980's;   for scar tissue post facture , no hardware   BREAST EXCISIONAL BIOPSY Left    1990;  1992;  1994  (all benign)   COLONOSCOPY  01/2022   DERMOID CYST  EXCISION     age 72-  per pt laparotomy unilateral fallopian/ ovarian dermoid cystectomy   KNEE ARTHROSCOPY Right 2005   KNEE ARTHROSCOPY WITH MEDIAL MENISECTOMY Left 05/27/2019   ORIF WRIST FRACTURE Right 06/2015    Family History  Adopted: Yes    Social History:  reports that she has never smoked. She has never used smokeless tobacco. She reports that she does not currently use alcohol. She reports that she does not use drugs.  Allergies:  Allergies  Allergen Reactions   Lactose Intolerance (Gi)    Lovastatin  Myalgia and severe fatigue   Penicillins Itching   Trimox [Amoxicillin Trihydrate] Itching   Amoxicillin Rash    No medications prior to admission.    Review of Systems  Constitutional:  Negative for fever.  HENT:  Negative for sore throat.   Eyes:  Negative for visual disturbance.  Respiratory:  Negative for shortness of breath.   Cardiovascular:  Negative for chest pain.  Gastrointestinal:  Negative for abdominal pain.  Genitourinary:  Positive for vaginal bleeding.  Musculoskeletal:  Negative for neck stiffness.  Skin:  Negative for rash.  Neurological:  Negative for headaches.  Psychiatric/Behavioral:  Negative for suicidal ideas.     Height '5\' 2"'$  (1.575 m), weight 83.9 kg. Physical Exam Chaperone Chaperone: present  Constitutional General Appearance: healthy-appearing, well-nourished, well-developed  Psychiatric Orientation: to  time, to place, to person Mood and Affect: active and alert, normal mood, normal affect  Abdomen Auscultation/Inspection/Palpation: normal bowel sounds, soft, non-distended, no tenderness, no hepatomegaly, no splenomegaly, no masses, no CVA tenderness Hernia: none palpated  Female Genitalia Vulva: no masses, no atrophy, no lesions Bladder/Urethra: normal meatus, no urethral discharge, no urethral mass, bladder non distended Vagina no erythema, no abnormal vaginal discharge, no vesicle(s) or ulcers, no cystocele, no rectocele, tenderness (diffusely, discomfort with exam) Cervix: grossly normal, no discharge, no cervical motion tenderness Uterus: normal size, normal shape, midline, mobile, non-tender, no uterine prolapse Adnexa/Parametria: no parametrial tenderness, no parametrial mass, no adnexal tenderness, no ovarian mass No results found for this or any previous visit (from the past 24 hour(s)).  No results found.  Assessment/Plan: 70Y G0 with postmenopausal bleeding and thickened endometrium - Plan: hysteroscopy, dilation and curettage, possible polypectomy or myomectomy with Myosure XL, under ultrasound guidance  - Reviewed risks of procedure including but not limited to nfection, bleeding, uterine perforation, laparoscopy, failure to achieve desired result, failure to complete procedure  Rowland Lathe 05/15/2022, 1:46 PM

## 2022-05-16 ENCOUNTER — Ambulatory Visit (HOSPITAL_COMMUNITY)
Admission: RE | Admit: 2022-05-16 | Discharge: 2022-05-16 | Disposition: A | Discharge status: Home / Self Care | Payer: Medicare Other | Source: Ambulatory Visit | Attending: Obstetrics and Gynecology | Admitting: Obstetrics and Gynecology

## 2022-05-16 ENCOUNTER — Ambulatory Visit (HOSPITAL_BASED_OUTPATIENT_CLINIC_OR_DEPARTMENT_OTHER): Payer: Medicare Other | Admitting: Anesthesiology

## 2022-05-16 ENCOUNTER — Other Ambulatory Visit: Payer: Self-pay

## 2022-05-16 ENCOUNTER — Encounter (HOSPITAL_BASED_OUTPATIENT_CLINIC_OR_DEPARTMENT_OTHER)
Admission: RE | Disposition: A | Discharge status: Home / Self Care | Payer: Self-pay | Source: Ambulatory Visit | Attending: Obstetrics and Gynecology

## 2022-05-16 ENCOUNTER — Encounter (HOSPITAL_BASED_OUTPATIENT_CLINIC_OR_DEPARTMENT_OTHER): Payer: Self-pay | Admitting: Obstetrics and Gynecology

## 2022-05-16 ENCOUNTER — Ambulatory Visit (HOSPITAL_BASED_OUTPATIENT_CLINIC_OR_DEPARTMENT_OTHER)
Admission: RE | Admit: 2022-05-16 | Discharge: 2022-05-16 | Disposition: A | Discharge status: Home / Self Care | Payer: Medicare Other | Source: Ambulatory Visit | Attending: Obstetrics and Gynecology | Admitting: Obstetrics and Gynecology

## 2022-05-16 DIAGNOSIS — Z6835 Body mass index (BMI) 35.0-35.9, adult: Secondary | ICD-10-CM | POA: Diagnosis not present

## 2022-05-16 DIAGNOSIS — N950 Postmenopausal bleeding: Secondary | ICD-10-CM | POA: Diagnosis not present

## 2022-05-16 DIAGNOSIS — E119 Type 2 diabetes mellitus without complications: Secondary | ICD-10-CM

## 2022-05-16 DIAGNOSIS — G4730 Sleep apnea, unspecified: Secondary | ICD-10-CM | POA: Diagnosis not present

## 2022-05-16 DIAGNOSIS — N840 Polyp of corpus uteri: Secondary | ICD-10-CM | POA: Insufficient documentation

## 2022-05-16 DIAGNOSIS — N858 Other specified noninflammatory disorders of uterus: Secondary | ICD-10-CM

## 2022-05-16 DIAGNOSIS — I10 Essential (primary) hypertension: Secondary | ICD-10-CM | POA: Diagnosis not present

## 2022-05-16 DIAGNOSIS — G4733 Obstructive sleep apnea (adult) (pediatric): Secondary | ICD-10-CM | POA: Diagnosis not present

## 2022-05-16 DIAGNOSIS — N882 Stricture and stenosis of cervix uteri: Secondary | ICD-10-CM | POA: Diagnosis not present

## 2022-05-16 DIAGNOSIS — Z9989 Dependence on other enabling machines and devices: Secondary | ICD-10-CM | POA: Diagnosis not present

## 2022-05-16 DIAGNOSIS — Z01818 Encounter for other preprocedural examination: Secondary | ICD-10-CM

## 2022-05-16 DIAGNOSIS — E669 Obesity, unspecified: Secondary | ICD-10-CM | POA: Diagnosis not present

## 2022-05-16 HISTORY — DX: Other specified health status: Z78.9

## 2022-05-16 HISTORY — PX: OPERATIVE ULTRASOUND: SHX5996

## 2022-05-16 HISTORY — DX: Dry eye syndrome of bilateral lacrimal glands: H04.123

## 2022-05-16 HISTORY — DX: Unspecified osteoarthritis, unspecified site: M19.90

## 2022-05-16 HISTORY — DX: Personal history of colonic polyps: Z86.010

## 2022-05-16 HISTORY — DX: Lactose intolerance, unspecified: E73.9

## 2022-05-16 HISTORY — DX: Essential (primary) hypertension: I10

## 2022-05-16 HISTORY — DX: Low back pain, unspecified: M54.50

## 2022-05-16 HISTORY — DX: Type 2 diabetes mellitus without complications: E11.9

## 2022-05-16 HISTORY — DX: Atherosclerosis of native arteries of extremities with intermittent claudication, bilateral legs: I70.213

## 2022-05-16 HISTORY — DX: Postmenopausal bleeding: N95.0

## 2022-05-16 HISTORY — DX: Personal history of adenomatous and serrated colon polyps: Z86.0101

## 2022-05-16 HISTORY — DX: Personal history of other diseases of the nervous system and sense organs: Z86.69

## 2022-05-16 HISTORY — DX: Encounter for adoption services: Z02.82

## 2022-05-16 HISTORY — DX: Localized edema: R60.0

## 2022-05-16 HISTORY — DX: Stress incontinence (female) (male): N39.3

## 2022-05-16 HISTORY — PX: DILATATION & CURETTAGE/HYSTEROSCOPY WITH MYOSURE: SHX6511

## 2022-05-16 HISTORY — DX: Mixed hyperlipidemia: E78.2

## 2022-05-16 HISTORY — DX: Leiomyoma of uterus, unspecified: D25.9

## 2022-05-16 LAB — CBC
HCT: 42.7 % (ref 36.0–46.0)
Hemoglobin: 13.7 g/dL (ref 12.0–15.0)
MCH: 28.5 pg (ref 26.0–34.0)
MCHC: 32.1 g/dL (ref 30.0–36.0)
MCV: 88.8 fL (ref 80.0–100.0)
Platelets: 270 10*3/uL (ref 150–400)
RBC: 4.81 MIL/uL (ref 3.87–5.11)
RDW: 15.1 % (ref 11.5–15.5)
WBC: 6.5 10*3/uL (ref 4.0–10.5)
nRBC: 0 % (ref 0.0–0.2)

## 2022-05-16 LAB — POCT I-STAT, CHEM 8
BUN: 23 mg/dL (ref 8–23)
Calcium, Ion: 1.22 mmol/L (ref 1.15–1.40)
Chloride: 103 mmol/L (ref 98–111)
Creatinine, Ser: 0.8 mg/dL (ref 0.44–1.00)
Glucose, Bld: 96 mg/dL (ref 70–99)
HCT: 44 % (ref 36.0–46.0)
Hemoglobin: 15 g/dL (ref 12.0–15.0)
Potassium: 4.9 mmol/L (ref 3.5–5.1)
Sodium: 140 mmol/L (ref 135–145)
TCO2: 26 mmol/L (ref 22–32)

## 2022-05-16 LAB — GLUCOSE, CAPILLARY: Glucose-Capillary: 128 mg/dL — ABNORMAL HIGH (ref 70–99)

## 2022-05-16 LAB — TYPE AND SCREEN
ABO/RH(D): O POS
Antibody Screen: NEGATIVE

## 2022-05-16 LAB — ABO/RH: ABO/RH(D): O POS

## 2022-05-16 SURGERY — DILATATION & CURETTAGE/HYSTEROSCOPY WITH MYOSURE
Anesthesia: General | Site: Uterus

## 2022-05-16 MED ORDER — DEXMEDETOMIDINE HCL IN NACL 80 MCG/20ML IV SOLN
INTRAVENOUS | Status: AC
  Filled 2022-05-16: qty 20

## 2022-05-16 MED ORDER — ONDANSETRON HCL 4 MG/2ML IJ SOLN
INTRAMUSCULAR | Status: AC
  Filled 2022-05-16: qty 2

## 2022-05-16 MED ORDER — PROPOFOL 10 MG/ML IV BOLUS
INTRAVENOUS | Status: AC
  Filled 2022-05-16: qty 20

## 2022-05-16 MED ORDER — LACTATED RINGERS IV SOLN: INTRAVENOUS | Status: DC

## 2022-05-16 MED ORDER — LIDOCAINE HCL 1 % IJ SOLN
INTRAMUSCULAR | Status: DC | PRN
  Administered 2022-05-16: 10 mL

## 2022-05-16 MED ORDER — SODIUM CHLORIDE 0.9 % IR SOLN
Status: DC | PRN
  Administered 2022-05-16: 3000 mL

## 2022-05-16 MED ORDER — ACETAMINOPHEN 325 MG PO TABS: 650.0000 mg | ORAL_TABLET | Freq: Four times a day (QID) | ORAL | Status: AC | PRN

## 2022-05-16 MED ORDER — DEXAMETHASONE SODIUM PHOSPHATE 10 MG/ML IJ SOLN
INTRAMUSCULAR | Status: AC
  Filled 2022-05-16: qty 1

## 2022-05-16 MED ORDER — PROPOFOL 10 MG/ML IV BOLUS
INTRAVENOUS | Status: DC | PRN
  Administered 2022-05-16: 150 mg via INTRAVENOUS

## 2022-05-16 MED ORDER — FENTANYL CITRATE (PF) 100 MCG/2ML IJ SOLN
INTRAMUSCULAR | Status: AC
  Filled 2022-05-16: qty 2

## 2022-05-16 MED ORDER — FENTANYL CITRATE (PF) 100 MCG/2ML IJ SOLN
INTRAMUSCULAR | Status: DC | PRN
  Administered 2022-05-16 (×2): 50 ug via INTRAVENOUS

## 2022-05-16 MED ORDER — ACETAMINOPHEN 500 MG PO TABS
1000.0000 mg | ORAL_TABLET | ORAL | Status: AC
  Administered 2022-05-16: 1000 mg via ORAL

## 2022-05-16 MED ORDER — ONDANSETRON HCL 4 MG/2ML IJ SOLN
INTRAMUSCULAR | Status: DC | PRN
  Administered 2022-05-16: 4 mg via INTRAVENOUS

## 2022-05-16 MED ORDER — LIDOCAINE 2% (20 MG/ML) 5 ML SYRINGE
INTRAMUSCULAR | Status: DC | PRN
  Administered 2022-05-16: 100 mg via INTRAVENOUS

## 2022-05-16 MED ORDER — DEXMEDETOMIDINE HCL IN NACL 200 MCG/50ML IV SOLN
INTRAVENOUS | Status: DC | PRN
  Administered 2022-05-16: 8 ug via INTRAVENOUS

## 2022-05-16 MED ORDER — PHENYLEPHRINE 80 MCG/ML (10ML) SYRINGE FOR IV PUSH (FOR BLOOD PRESSURE SUPPORT)
PREFILLED_SYRINGE | INTRAVENOUS | Status: DC | PRN
  Administered 2022-05-16: 160 ug via INTRAVENOUS
  Administered 2022-05-16 (×2): 80 ug via INTRAVENOUS

## 2022-05-16 MED ORDER — DEXAMETHASONE SODIUM PHOSPHATE 10 MG/ML IJ SOLN
INTRAMUSCULAR | Status: DC | PRN
  Administered 2022-05-16: 4 mg via INTRAVENOUS

## 2022-05-16 MED ORDER — ACETAMINOPHEN 500 MG PO TABS
ORAL_TABLET | ORAL | Status: AC
  Filled 2022-05-16: qty 2

## 2022-05-16 SURGICAL SUPPLY — 18 items
CATH ROBINSON RED A/P 16FR (CATHETERS) ×2 IMPLANT
DEVICE MYOSURE LITE (MISCELLANEOUS) IMPLANT
DEVICE MYOSURE REACH (MISCELLANEOUS) IMPLANT
DRSG TELFA 3X8 NADH STRL (GAUZE/BANDAGES/DRESSINGS) ×2 IMPLANT
GAUZE 4X4 16PLY ~~LOC~~+RFID DBL (SPONGE) ×2 IMPLANT
GLOVE BIO SURGEON STRL SZ 6 (GLOVE) ×2 IMPLANT
GLOVE BIOGEL PI IND STRL 6.5 (GLOVE) ×2 IMPLANT
GOWN STRL REUS W/ TWL LRG LVL3 (GOWN DISPOSABLE) ×2 IMPLANT
GOWN STRL REUS W/TWL LRG LVL3 (GOWN DISPOSABLE) ×1
KIT PROCEDURE FLUENT (KITS) ×2 IMPLANT
KIT TURNOVER CYSTO (KITS) ×2 IMPLANT
MYOSURE XL FIBROID (MISCELLANEOUS) ×1
PACK VAGINAL MINOR WOMEN LF (CUSTOM PROCEDURE TRAY) ×2 IMPLANT
PAD OB MATERNITY 4.3X12.25 (PERSONAL CARE ITEMS) ×2 IMPLANT
SEAL ROD LENS SCOPE MYOSURE (ABLATOR) ×2 IMPLANT
SYSTEM TISS REMOVAL MYOSURE XL (MISCELLANEOUS) IMPLANT
TOWEL OR 17X26 10 PK STRL BLUE (TOWEL DISPOSABLE) ×2 IMPLANT
UNDERPAD 30X36 HEAVY ABSORB (UNDERPADS AND DIAPERS) ×2 IMPLANT

## 2022-05-16 NOTE — Transfer of Care (Signed)
Immediate Anesthesia Transfer of Care Note  Patient: Brittney Williamson  Procedure(s) Performed: DILATATION & CURETTAGE/HYSTEROSCOPY WITH MYOSURE XL (Uterus) OPERATIVE ULTRASOUND (Uterus)  Patient Location: PACU  Anesthesia Type:General  Level of Consciousness: awake, alert , oriented, and patient cooperative  Airway & Oxygen Therapy: Patient Spontanous Breathing  Post-op Assessment: Report given to RN and Post -op Vital signs reviewed and stable  Post vital signs: Reviewed and stable  Last Vitals:  Vitals Value Taken Time  BP 127/70 05/16/22 1330  Temp    Pulse 66 05/16/22 1330  Resp 8 05/16/22 1330  SpO2 98 % 05/16/22 1330  Vitals shown include unvalidated device data.  Last Pain:  Vitals:   05/16/22 1038  TempSrc: Oral  PainSc: 0-No pain      Patients Stated Pain Goal: 5 (95/63/87 5643)  Complications: No notable events documented.

## 2022-05-16 NOTE — Brief Op Note (Signed)
05/16/2022  1:24 PM  PATIENT:  Brittney Williamson  72 y.o. female  PRE-OPERATIVE DIAGNOSIS:  Postmenopausal bleeding, mass in endometrial cavity  POST-OPERATIVE DIAGNOSIS:  Postmenopausal bleeding, mass in endometrial cavity  PROCEDURE:  Procedure(s): DILATATION & CURETTAGE/HYSTEROSCOPY WITH MYOSURE XL (N/A) OPERATIVE ULTRASOUND (N/A)  SURGEON:  Surgeon(s) and Role:    * Rowland Lathe, MD - Primary  ANESTHESIA:   local and general  EBL:  5 mL   BLOOD ADMINISTERED:none  DRAINS: none   LOCAL MEDICATIONS USED:  LIDOCAINE  and Amount: 10 ml  SPECIMEN:  Source of Specimen:  endometrial curettings and mass  DISPOSITION OF SPECIMEN:  PATHOLOGY  COUNTS:  YES  TOURNIQUET:  * No tourniquets in log *  DICTATION: .Note written in EPIC  PLAN OF CARE: Discharge to home after PACU  PATIENT DISPOSITION:  PACU - hemodynamically stable.   Delay start of Pharmacological VTE agent (>24hrs) due to surgical blood loss or risk of bleeding: not applicable

## 2022-05-16 NOTE — Anesthesia Procedure Notes (Signed)
Procedure Name: LMA Insertion Date/Time: 05/16/2022 12:44 PM  Performed by: Rogers Blocker, CRNAPre-anesthesia Checklist: Patient identified, Emergency Drugs available, Suction available and Patient being monitored Patient Re-evaluated:Patient Re-evaluated prior to induction Oxygen Delivery Method: Circle System Utilized Preoxygenation: Pre-oxygenation with 100% oxygen Induction Type: IV induction Ventilation: Mask ventilation without difficulty LMA: LMA inserted LMA Size: 4.0 Number of attempts: 1 Airway Equipment and Method: Bite block Placement Confirmation: positive ETCO2 Tube secured with: Tape Dental Injury: Teeth and Oropharynx as per pre-operative assessment

## 2022-05-16 NOTE — Anesthesia Postprocedure Evaluation (Signed)
Anesthesia Post Note  Patient: Brittney Williamson  Procedure(s) Performed: DILATATION & CURETTAGE/HYSTEROSCOPY WITH MYOSURE XL (Uterus) OPERATIVE ULTRASOUND (Uterus)     Patient location during evaluation: PACU Anesthesia Type: General Level of consciousness: awake and alert Pain management: pain level controlled Vital Signs Assessment: post-procedure vital signs reviewed and stable Respiratory status: spontaneous breathing, nonlabored ventilation, respiratory function stable and patient connected to nasal cannula oxygen Cardiovascular status: blood pressure returned to baseline and stable Postop Assessment: no apparent nausea or vomiting Anesthetic complications: no  No notable events documented.  Last Vitals:  Vitals:   05/16/22 1400 05/16/22 1439  BP: 135/78 (!) 155/79  Pulse: 62 67  Resp: 16 15  Temp: (!) 36.1 C 36.6 C  SpO2: 99% 98%    Last Pain:  Vitals:   05/16/22 1400  TempSrc:   PainSc: 2                  Barnet Glasgow

## 2022-05-16 NOTE — Op Note (Signed)
05/16/2022   1:24 PM   PATIENT:  Brittney Williamson  72 y.o. female   PRE-OPERATIVE DIAGNOSIS:  Postmenopausal bleeding, mass in endometrial cavity   POST-OPERATIVE DIAGNOSIS:  Postmenopausal bleeding, mass in endometrial cavity   PROCEDURE:  Procedure(s): DILATATION & CURETTAGE/HYSTEROSCOPY WITH MYOSURE XL (N/A) OPERATIVE ULTRASOUND (N/A)   SURGEON:  Surgeon(s) and Role:    * Rowland Lathe, MD - Primary   ANESTHESIA:   local and general   EBL:  5 mL    BLOOD ADMINISTERED:none   DRAINS: none    LOCAL MEDICATIONS USED:  LIDOCAINE  and Amount: 10 ml   SPECIMEN:  Source of Specimen:  endometrial curettings and mass   DISPOSITION OF SPECIMEN:  PATHOLOGY   COUNTS:  YES   TOURNIQUET:  * No tourniquets in log *   DICTATION: .Note written in EPIC   PLAN OF CARE: Discharge to home after PACU   PATIENT DISPOSITION:  PACU - hemodynamically stable.  COMPLICATIONS: none  FINDINGS: Small stenotic cervix. Anteverted uterus sounded to 6cm. On hysterscopic view, bilateral ostia visualized. Two smooth vascular masses suspect leiomyomas. #1 approximately 1.5cm originating from posterior lower uterus. #2 approximately 1cm originating from anterior left uterus. Otherwise thin appearing endometrium.  Fluid deficit 862m.    PROCEDURE IN DETAIL:  The patient was appropriately consented and taken to the operating room where anesthesia was administered without difficulty. Thromboguards were placed and connected. She was placed in the dorsal lithotomy position in stirrups. She was examined under anesthesia and found to have an anteverted uterus. The patient was then prepped and draped in normal sterile fashion. A long speculum was inserted into the vagina. A single-tooth tenaculum was used to grasp the anterior lip of the cervix. A paracervical block was obtained by injecting a total of 126mof 1% lidocaine at the 4 and 8 o'clock positions at the cervicovaginal junction.   The following  portion was performed under [place].  The uterus was sounded to 6cm. The cervical os was sequentially dilated using Hagar dilators to accommodate the hysteroscope. The hysteroscope was introduced under direct visualization and the uterus was distended with normal saline. Bilateral ostia were visualized. Findings as noted above. The Myosure XL device was used to resect the masses and obtain endometrial curettings. Specimens were collected for pathology. Hemostasis was noted in the uterine cavity. The hysteroscope was removed.  The tenaculum was removed from the cervix. Hemostasis was obtained at the puncture sites with silver nitrate and pressure. The patient tolerated the procedure well. The instrument and sponge counts were correct times two. The patient was awakened from anesthesia and taken to the recovery room in stable condition.  MiIrene PapMD 05/16/22 1:30 PM

## 2022-05-16 NOTE — Interval H&P Note (Signed)
History and Physical Interval Note:  05/16/2022 12:33 PM  Brittney Williamson  has presented today for surgery, with the diagnosis of Postmenopausal bleeding.  The various methods of treatment have been discussed with the patient and family. After consideration of risks, benefits and other options for treatment, the patient has consented to  Procedure(s): DILATATION & CURETTAGE/HYSTEROSCOPY WITH MYOSURE XL (N/A) OPERATIVE ULTRASOUND (N/A) as a surgical intervention.  The patient's history has been reviewed, patient examined, no change in status, stable for surgery.  I have reviewed the patient's chart and labs.  Questions were answered to the patient's satisfaction.     Rowland Lathe

## 2022-05-17 ENCOUNTER — Encounter (HOSPITAL_BASED_OUTPATIENT_CLINIC_OR_DEPARTMENT_OTHER): Payer: Self-pay | Admitting: Obstetrics and Gynecology

## 2022-05-17 LAB — SURGICAL PATHOLOGY

## 2022-05-21 ENCOUNTER — Ambulatory Visit (HOSPITAL_BASED_OUTPATIENT_CLINIC_OR_DEPARTMENT_OTHER): Payer: 59 | Admitting: Family Medicine

## 2022-05-30 ENCOUNTER — Telehealth (HOSPITAL_BASED_OUTPATIENT_CLINIC_OR_DEPARTMENT_OTHER): Payer: Self-pay | Admitting: Family Medicine

## 2022-05-30 NOTE — Telephone Encounter (Signed)
Pt has Advised NO TO THE FLU SHOT

## 2022-06-06 NOTE — Patient Instructions (Signed)

## 2022-06-06 NOTE — Progress Notes (Signed)
PATIENT: Brittney Williamson DOB: 05/11/1950  REASON FOR VISIT: follow up HISTORY FROM: patient  Chief Complaint  Patient presents with   Room 1    Pt is here Alone. Pt states that things have improved.      HISTORY OF PRESENT ILLNESS:  06/11/22 ALL:  Brittney Williamson returns for follow up for for OSA on CPAP. She continues to do well on therapy. She is using CPAP nightly for about 7-8 hours. She denies concerns with machine or supplies.     06/05/2021 ALL: Brittney Williamson returns for follow up for OSA on CPAP. Her download below shows excellent compliance for days worn 97%. She does well for wearing her machine greater than 4 hours 73%. Her apnea is well managed with an AHI of 2 on auto pressure setting 5-15 mmHg. Moderate leak in the 95th percentile at 13. 2 L/min. She reports that she is doing ok but is struggling with raising a new puppy and states her sleep patterns are sporadic. She does endorse that her machine and CPAP are comfortable.      06/02/2020 ALL:  She returns for follow up for OSA on CPAP therapy.   Compliance report dated 05/02/2020 through 05/31/2020 reveals that she used CPAP 29 in the past 30 days for compliance of 97%.  She used CPAP greater than 4 hours 19 of the past 30 days for compliance of 63%.  Average usage on days used was 5 hours and 6 minutes.  Residual AHI was 2.3 on 5 to 15 cm of water and an EPR of 1.  There was no significant leak noted.  06/02/2019 ALL:  Brittney Williamson is a 72 y.o. female here today for follow up for OSA on CPAP. She is doing very well with CPAP therapy. She has gotten accustomed to using her machine every night and for at least 4 hours. She recently had left knee surgery and is recovering well. She feels great today and without concerns.   Compliance report dated 05/02/2019 through 05/31/2019 reveals that she has used CPAP 29 of the last 30 days for compliance of 97%.  27 of the last 30 days she used CPAP greater than 4 hours for compliance of 90%.   Average usage was 6 hours and 18 minutes.  Residual AHI was 3.4 on 5 to 15 cm of water and an EPR of 1.  There was no significant leak noted.  01/07/2019 ALL: Brittney Williamson is a 72 y.o. female here today for follow up of OSA on CPAP. She has continued to work on compliance.  She admits that she does not sleep for more than 4 hours at night.  She does take naps during the day.  She denies any concerns with CPAP therapy.   Compliance report dated 10/09/2018 through 01/06/2019 reveals that she is using CPAP 80 out of the last 90 days for compliance of 89%.  57 days she used CPAP greater than 4 hours for compliance of 63%.  Average usage was 4 hours and 46 minutes.  AHI was slightly elevated at 7.1 on 5 to 15 cm of water and an EPR of 3.  There was no significant leak.   10/07/2018 ALL: Brittney Williamson is a 72 y.o. female here today for follow up of OSA on CPAP. She reports that she is continuing to adjust to CPAP therapy.  She is trying to use her machine each night.  Download report dated 09/07/2018 through 10/06/2018 reveals that over the last  30 days she is used her machine 27 out of 30 days for compliance of 90%.  19 days she used her machine greater than 4 hours for compliance of 63%.  Average usage was about 4 hours and 56 minutes.  AHI was 4.2 on 5 to 15 cm of water and an EPR level of 3.  There was no significant leak.  She does note benefit from CPAP therapy when used regularly.   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 After her diagnosis with sleep apnea out in the split-night titration study from 13 February 2016 she had voiced an interest in the dental device and had actually spoken to Dr. Ron Parker, but financial means were not such that she could pursue this treatment  her AHI was rather high she had severe apnea with an AHI of 45.7 slightly accentuated in supine and in rem sleep.  Her AHI became 1.3 after she was titrated to 9 cmH2O CPAP and she has therefore pursued for CPAP treatment she endorsed today the Epworth Sleepiness Scale at 6 points the fatigue severity scale of 0 the geriatric depression score at 1 out of 15.   Good compliance to CPAP however was spotty I do have a 3 months compliance data and for the month of November she did very well however in December she barely uses the CPAP every third day and she had not used it through the month of October either.  CPAP is set at 9 cm water pressure with 3 cm EPR and the average user time is 1 hour and 23 minutes.  She does have very minor air leakage and some central apneas arising.  I would like for her to continue on CPAP and she still owns an S9 AutoSet.  The patient stated her sleep study felt not representative for her normal sleep.  Can she repeat a study?  I will arrange for a HST.    UPDATE 01/23/2017.CM Brittney Williamson, 72 year old female returns for follow-up for CPAP compliance. She has severe obstructive sleep apnea and periodic limb movements. CPAP compliance data dated 12/24/2016-01/22/2017 shows greater than 4 hours for 17 days or 57%. Average usage 5 hours 5 minutes. Set pressure 9 cm EPR 3AHI 6.8.ESS 8. FSS 22. She returns for reevaluation.   07/02/16 CM: Brittney Williamson, 72 year old female returns for CPAP compliance after having a split-night titration sleep study on 02/13/2016. Her last compliance report greater than 4 hours 63% for 19 days. Her compliance today is 25 out of 30 days 83% compliance greater than 4 hours compliance 67%.   Her sleep  shows severe obstructive sleep apnea and periodic limb movements of sleep unknown  clinical significance. She is on  CPAP at 9 cm. She is advised to avoid sedative hypnotics which may worsen her sleep apnea also alcohol and tobacco. She claims  today that   sometimes when she goes to bed she is too tired and forgets to use the appliance. She denies having any illnesses but she could not use.Average usage for days used 5 hours 23 minutes at 9 cm of pressure EPR level 3. AHI for 3.1.  She returns for reevaluation   REVIEW OF SYSTEMS: Out of a complete 14 system review of symptoms, the patient complains only  of the following symptoms, muscle cramps, fatigue and all other reviewed systems are negative  ESS: 5  ALLERGIES: Allergies  Allergen Reactions   Lactose Intolerance (Gi)    Lovastatin     Myalgia and severe fatigue   Penicillins Itching   Trimox [Amoxicillin Trihydrate] Itching   Amoxicillin Rash    HOME MEDICATIONS: Outpatient Medications Prior to Visit  Medication Sig Dispense Refill   acetaminophen (TYLENOL) 325 MG tablet Take 2 tablets (650 mg total) by mouth every 6 (six) hours as needed.     acyclovir (ZOVIRAX) 400 MG tablet Take 400 mg by mouth as needed.     Ascorbic Acid (VITAMIN C) 500 MG CHEW Chew by mouth daily. 2 gummies     aspirin 81 MG chewable tablet Chew 81 mg by mouth daily.     b complex vitamins capsule Take 1 capsule by mouth daily.     Coenzyme Q10 (COQ-10) 100 MG CAPS Take 1 capsule by mouth daily.     diclofenac Sodium (VOLTAREN ARTHRITIS PAIN) 1 % GEL Apply 4 g topically as needed.     dorzolamide-timolol (COSOPT) 2-0.5 % ophthalmic solution Place 1 drop into the right eye 2 (two) times daily.     fenofibrate (TRICOR) 145 MG tablet Take 1 tablet (145 mg total) by mouth daily. (Patient taking differently: Take 145 mg by mouth daily.) 90 tablet 3   KRILL OIL PO Take 750 mg by mouth daily.     Lactase (LACTAID PO) Take by mouth as needed.     melatonin 5 MG TABS Take 5 mg by mouth at bedtime as needed.     Menthol-Methyl Salicylate (SALONPAS PAIN RELIEF PATCH) 3-10 % PTCH Apply topically as needed.     naproxen sodium (ALEVE) 220 MG tablet Take 220 mg by mouth 2 (two) times daily as needed.     rosuvastatin  (CRESTOR) 10 MG tablet Take 1 tablet (10 mg total) by mouth at bedtime. (Patient taking differently: Take 10 mg by mouth daily.) 90 tablet 3   Vitamin D-Vitamin K (VITAMIN K2-VITAMIN D3 PO) Take 1 capsule by mouth daily. Dosage  136mg (d3)/  90 mg (k2)     Zinc 50 MG TABS Take 1 tablet by mouth once a week.     No facility-administered medications prior to visit.    PAST MEDICAL HISTORY: Past Medical History:  Diagnosis Date   Adopted    05-10-2022  per pt does not know biological family medical history   Amaurosis fugax of right eye 2021   Atherosclerosis of both lower extremities with intermittent claudication ([place]    vacular--- dr c. cCarlis Abbott  Chronically dry eyes, bilateral    Diet-controlled type 2 diabetes mellitus ([place]    Edema of both lower extremities    due to PAD   H/O mitral valve prolapse 1993   per pt told in 1993 had MVP,  per cardiologist--- dr sMarlou Porch, no evidence mvp per echo in epic 09-07-2019   History of adenomatous polyp of colon    History of retinal tear    horseshow tear right eye  s/p cryoablation   Hypertension    followed by pcp and cardiologist   (cardiac CT  04-21-2021  calcium score zero   Lactose intolerance    Low back pain    05-10-2022  per pt pain just started   Mixed hyperlipidemia    followed by pcp and cardiologist  (dr sMarlou Porch   OA (osteoarthritis)    OSA on  CPAP 1993   followed by dr Brett Fairy;   first dx 1993;  restested 02-17-2016 in epic, AHI 45.7/hr   PMB (postmenopausal bleeding)    SUI (stress urinary incontinence, female)    Uterine fibroid     PAST SURGICAL HISTORY: Past Surgical History:  Procedure Laterality Date   ANKLE ARTHROSCOPY Left    1980's;   for scar tissue post facture , no hardware   BREAST EXCISIONAL BIOPSY Left    1990;  1992;  1994  (all benign)   COLONOSCOPY  01/2022   DERMOID CYST  EXCISION     age 69--  per pt laparotomy unilateral fallopian/ ovarian dermoid cystectomy   DILATATION &  CURETTAGE/HYSTEROSCOPY WITH MYOSURE N/A 05/16/2022   Procedure: DILATATION & CURETTAGE/HYSTEROSCOPY WITH MYOSURE XL;  Surgeon: Rowland Lathe, MD;  Location: [facility];  Service: Gynecology;  Laterality: N/A;   KNEE ARTHROSCOPY Right 2005   KNEE ARTHROSCOPY WITH MEDIAL MENISECTOMY Left 05/27/2019   OPERATIVE ULTRASOUND N/A 05/16/2022   Procedure: OPERATIVE ULTRASOUND;  Surgeon: Rowland Lathe, MD;  Location: [facility];  Service: Gynecology;  Laterality: N/A;   ORIF WRIST FRACTURE Right 06/2015    FAMILY HISTORY: Family History  Adopted: Yes    SOCIAL HISTORY: Social History   Socioeconomic History   Marital status: Single    Spouse name: Not on file   Number of children: Not on file   Years of education: Not on file   Highest education level: Not on file  Occupational History   Not on file  Tobacco Use   Smoking status: Never   Smokeless tobacco: Never  Vaping Use   Vaping Use: Never used  Substance and Sexual Activity   Alcohol use: Not Currently    Comment: rare   Drug use: Never   Sexual activity: Not on file  Other Topics Concern   Not on file  Social History Narrative   Not on file   Social Determinants of Health   Financial Resource Strain: Not on file  Food Insecurity: Not on file  Transportation Needs: Not on file  Physical Activity: Not on file  Stress: Not on file  Social Connections: Not on file  Intimate Partner Violence: Not on file      PHYSICAL EXAM  Vitals:   06/11/22 0758  BP: (!) 153/93  Pulse: 75  Weight: 193 lb (87.5 kg)  Height: 5' 2"$  (1.575 m)     Body mass index is 35.3 kg/m.  Generalized: Well developed, in no acute distress  Cardiology: normal rate and rhythm, no murmur noted Respiratory: Clear to auscultation bilaterally  Neurological examination  Mentation: Alert oriented to time, place, history taking. Follows all commands speech and language fluent Cranial nerve II-XII:  Pupils were equal round reactive to light. Extraocular movements were full, visual field were full  Motor: The motor testing reveals 5 over 5 strength of all 4 extremities. Good symmetric motor tone is noted throughout.  Gait and station: Gait is normal.   DIAGNOSTIC DATA (LABS, IMAGING, TESTING) - I reviewed patient records, labs, notes, testing and imaging myself where available.      No data to display           Lab Results  Component Value Date   WBC 6.5 05/16/2022   HGB 15.0 05/16/2022   HCT 44.0 05/16/2022   MCV 88.8 05/16/2022   PLT 270 05/16/2022      Component Value Date/Time   NA 140 05/16/2022 1027  NA 141 02/12/2022 0903   K 4.9 05/16/2022 1027   CL 103 05/16/2022 1027   CO2 23 02/12/2022 0903   GLUCOSE 96 05/16/2022 1027   BUN 23 05/16/2022 1027   BUN 16 02/12/2022 0903   CREATININE 0.80 05/16/2022 1027   CREATININE 0.80 11/18/2015 0811   CALCIUM 9.6 02/12/2022 0903   PROT 7.0 02/12/2022 0903   ALBUMIN 4.5 02/12/2022 0903   AST 25 02/12/2022 0903   ALT 28 02/12/2022 0903   ALKPHOS 66 02/12/2022 0903   BILITOT 0.3 02/12/2022 0903   GFRNONAA 63 05/20/2020 0829   GFRNONAA 78 11/18/2015 0811   GFRAA 73 05/20/2020 0829   GFRAA >89 11/18/2015 0811   Lab Results  Component Value Date   CHOL 112 02/12/2022   HDL 33 (L) 02/12/2022   LDLCALC 44 02/12/2022   LDLDIRECT 78 11/06/2019   TRIG 220 (H) 02/12/2022   CHOLHDL 3.4 02/12/2022   Lab Results  Component Value Date   HGBA1C 6.9 (H) 02/12/2022   No results found for: "VITAMINB12" Lab Results  Component Value Date   TSH 0.803 01/23/2021       ASSESSMENT AND PLAN 72 y.o. year old female  has a past medical history of Adopted, Amaurosis fugax of right eye (2021), Atherosclerosis of both lower extremities with intermittent claudication ([place]), Chronically dry eyes, bilateral, Diet-controlled type 2 diabetes mellitus ([place]), Edema of both lower extremities, H/O mitral valve prolapse (1993), History of  adenomatous polyp of colon, History of retinal tear, Hypertension, Lactose intolerance, Low back pain, Mixed hyperlipidemia, OA (osteoarthritis), OSA on CPAP (1993), PMB (postmenopausal bleeding), SUI (stress urinary incontinence, female), and Uterine fibroid. here with     ICD-10-CM   1. OSA on CPAP  G47.33 For home use only DME continuous positive airway pressure (CPAP)      Deahna is doing very well with CPAP therapy. Compliance report reveals excellent daily compliance. 4 hour compliance is better but hopefully it will improve as the puppy gets older. She was encouraged to continue using CPAP nightly and for greater than 4 hours each night. She will follow-up with [place] in 1 year, sooner if needed.  She verbalizes understanding and agreement with this plan.   Orders Placed This Encounter  Procedures   For home use only DME continuous positive airway pressure (CPAP)    Supplies    Order Specific Question:   Length of Need    Answer:   Lifetime    Order Specific Question:   Patient has OSA or probable OSA    Answer:   Yes    Order Specific Question:   Is the patient currently using CPAP in the home    Answer:   Yes    Order Specific Question:   Settings    Answer:   Other see comments    Order Specific Question:   CPAP supplies needed    Answer:   Mask, headgear, cushions, filters, heated tubing and water chamber     No orders of the defined types were placed in this encounter.     Debbora Presto, FNP-C 06/11/2022, 8:00 AM [facility] [address] [place], [place] [address] [phone]

## 2022-06-11 ENCOUNTER — Ambulatory Visit (INDEPENDENT_AMBULATORY_CARE_PROVIDER_SITE_OTHER): Payer: Medicare Other | Admitting: Family Medicine

## 2022-06-11 ENCOUNTER — Encounter: Payer: Self-pay | Admitting: Family Medicine

## 2022-06-11 ENCOUNTER — Ambulatory Visit: Payer: Medicare Other | Admitting: Family Medicine

## 2022-06-11 VITALS — BP 153/93 | HR 75 | Ht 62.0 in | Wt 193.0 lb

## 2022-06-11 DIAGNOSIS — G4733 Obstructive sleep apnea (adult) (pediatric): Secondary | ICD-10-CM

## 2022-07-16 ENCOUNTER — Ambulatory Visit (INDEPENDENT_AMBULATORY_CARE_PROVIDER_SITE_OTHER): Payer: Medicare Other

## 2022-07-16 ENCOUNTER — Encounter (HOSPITAL_BASED_OUTPATIENT_CLINIC_OR_DEPARTMENT_OTHER): Payer: Self-pay | Admitting: Family Medicine

## 2022-07-16 ENCOUNTER — Ambulatory Visit (INDEPENDENT_AMBULATORY_CARE_PROVIDER_SITE_OTHER): Payer: Medicare Other | Admitting: Family Medicine

## 2022-07-16 VITALS — BP 149/86 | HR 88 | Ht 62.0 in | Wt 195.0 lb

## 2022-07-16 DIAGNOSIS — E119 Type 2 diabetes mellitus without complications: Secondary | ICD-10-CM

## 2022-07-16 DIAGNOSIS — M79672 Pain in left foot: Secondary | ICD-10-CM

## 2022-07-16 DIAGNOSIS — M7660 Achilles tendinitis, unspecified leg: Secondary | ICD-10-CM | POA: Insufficient documentation

## 2022-07-16 DIAGNOSIS — M7662 Achilles tendinitis, left leg: Secondary | ICD-10-CM | POA: Diagnosis not present

## 2022-07-16 DIAGNOSIS — M7989 Other specified soft tissue disorders: Secondary | ICD-10-CM | POA: Diagnosis not present

## 2022-07-16 LAB — HEMOGLOBIN A1C
Est. average glucose Bld gHb Est-mCnc: 163 mg/dL
Hgb A1c MFr Bld: 7.3 % — ABNORMAL HIGH (ref 4.8–5.6)

## 2022-07-16 NOTE — Assessment & Plan Note (Signed)
Patient reports that she has been having some left foot pain.  She indicates that pain has been in the area of the base of the fifth metatarsal.  Pain is worse with ambulation, particularly if she has more pressure over lateral aspect of foot.  She denies any prior injuries in the area.  Does have some mild redness, mild swelling.  No numbness or tingling. On exam, patient does have some tenderness to palpation in the area of the base of the fifth metatarsal, some redness apparent in this area as well.  Normal range of motion at ankle before active and passive range of motion.  Distal neurovascular exam is intact. Given area involved, there is some potential for underlying stress fracture, doubt obvious traumatic fracture she did not have any recent trauma.  Will proceed with initial x-ray imaging, other x-rays may lag for stress reaction/fracture. If stress reaction or fracture is observed, we will manage accordingly If x-ray is unremarkable, likely would refer to podiatry for further evaluation as adjustments in footwear and/or orthotics may be of benefit for patient

## 2022-07-16 NOTE — Progress Notes (Signed)
    Procedures performed today:    None.  Independent interpretation of notes and tests performed by another provider:   None.  Brief History, Exam, Impression, and Recommendations:    BP (!) 149/86 (BP Location: Left Arm, Patient Position: Sitting, Cuff Size: Normal)   Pulse 88   Ht 5\' 2"  (1.575 m)   Wt 195 lb (88.5 kg)   SpO2 98%   BMI 35.67 kg/m   Diabetes mellitus ([place]) Has been about 5 months since last hemoglobin A1c check.  Hemoglobin A1c at that time was at goal, however had increased, reading at the time was 6.9%.  She does report that she has had some occasional symptoms recently of increased thirst and peeing more frequently.  Does not have any symptoms at the present time.  Patient continues with lifestyle modifications, she has been resistant to starting any pharmacotherapy related to diabetes.  In discussing, she indicates that she had also apparently seen online where there was some discussion about a diagnosis of diabetes being made at 7.0% at times.  Again reviewed diagnosis with patient and that she does have diabetes and thus it is important to manage as such Will update hemoglobin A1c today to observe for any changes since last check, especially given new symptoms Plan to complete foot exam at future office visit  Left foot pain Patient reports that she has been having some left foot pain.  She indicates that pain has been in the area of the base of the fifth metatarsal.  Pain is worse with ambulation, particularly if she has more pressure over lateral aspect of foot.  She denies any prior injuries in the area.  Does have some mild redness, mild swelling.  No numbness or tingling. On exam, patient does have some tenderness to palpation in the area of the base of the fifth metatarsal, some redness apparent in this area as well.  Normal range of motion at ankle before active and passive range of motion.  Distal neurovascular exam is intact. Given area involved, there is  some potential for underlying stress fracture, doubt obvious traumatic fracture she did not have any recent trauma.  Will proceed with initial x-ray imaging, other x-rays may lag for stress reaction/fracture. If stress reaction or fracture is observed, we will manage accordingly If x-ray is unremarkable, likely would refer to podiatry for further evaluation as adjustments in footwear and/or orthotics may be of benefit for patient  Achilles tendinitis Patient reports that over area of left Achilles tendon she has had small amount of swelling observed.  This has been over recent weeks or months.  She denies any significant pain in the area. On exam, small palpable nodule over Achilles tendon is present, no overlying redness or erythema.  She does have some mild tenderness on palpation. Given location, suspect that this is related to Achilles tendon pathology.  Discussed considerations.  Given that she is not having significant symptoms at present, can continue with monitoring, discussed use of heel lifts in shoes should area become more uncomfortable or painful  Return in about 3 months (around 10/16/2022) for DM.   ___________________________________________ Helios [place] [name], MD, ABFM, CAQSM [facility] [place]

## 2022-07-16 NOTE — Assessment & Plan Note (Signed)
Has been about 5 months since last hemoglobin A1c check.  Hemoglobin A1c at that time was at goal, however had increased, reading at the time was 6.9%.  She does report that she has had some occasional symptoms recently of increased thirst and peeing more frequently.  Does not have any symptoms at the present time.  Patient continues with lifestyle modifications, she has been resistant to starting any pharmacotherapy related to diabetes.  In discussing, she indicates that she had also apparently seen online where there was some discussion about a diagnosis of diabetes being made at 7.0% at times.  Again reviewed diagnosis with patient and that she does have diabetes and thus it is important to manage as such Will update hemoglobin A1c today to observe for any changes since last check, especially given new symptoms Plan to complete foot exam at future office visit

## 2022-07-16 NOTE — Assessment & Plan Note (Signed)
Patient reports that over area of left Achilles tendon she has had small amount of swelling observed.  This has been over recent weeks or months.  She denies any significant pain in the area. On exam, small palpable nodule over Achilles tendon is present, no overlying redness or erythema.  She does have some mild tenderness on palpation. Given location, suspect that this is related to Achilles tendon pathology.  Discussed considerations.  Given that she is not having significant symptoms at present, can continue with monitoring, discussed use of heel lifts in shoes should area become more uncomfortable or painful

## 2022-07-23 DIAGNOSIS — Z1231 Encounter for screening mammogram for malignant neoplasm of breast: Secondary | ICD-10-CM | POA: Diagnosis not present

## 2022-07-23 LAB — HM MAMMOGRAPHY

## 2022-07-24 ENCOUNTER — Encounter (HOSPITAL_BASED_OUTPATIENT_CLINIC_OR_DEPARTMENT_OTHER): Payer: 59

## 2022-07-25 ENCOUNTER — Encounter (HOSPITAL_BASED_OUTPATIENT_CLINIC_OR_DEPARTMENT_OTHER): Payer: Self-pay | Admitting: Family Medicine

## 2022-08-07 ENCOUNTER — Ambulatory Visit (HOSPITAL_BASED_OUTPATIENT_CLINIC_OR_DEPARTMENT_OTHER): Payer: Medicare Other

## 2022-08-07 ENCOUNTER — Telehealth: Payer: Self-pay | Admitting: Cardiology

## 2022-08-07 ENCOUNTER — Encounter (HOSPITAL_BASED_OUTPATIENT_CLINIC_OR_DEPARTMENT_OTHER): Payer: Self-pay

## 2022-08-07 VITALS — Ht 62.0 in | Wt 189.0 lb

## 2022-08-07 DIAGNOSIS — Z0000 Encounter for general adult medical examination without abnormal findings: Secondary | ICD-10-CM | POA: Diagnosis not present

## 2022-08-07 DIAGNOSIS — E782 Mixed hyperlipidemia: Secondary | ICD-10-CM

## 2022-08-07 DIAGNOSIS — I70211 Atherosclerosis of native arteries of extremities with intermittent claudication, right leg: Secondary | ICD-10-CM

## 2022-08-07 DIAGNOSIS — E785 Hyperlipidemia, unspecified: Secondary | ICD-10-CM

## 2022-08-07 DIAGNOSIS — E119 Type 2 diabetes mellitus without complications: Secondary | ICD-10-CM

## 2022-08-07 NOTE — Telephone Encounter (Signed)
Pt requesting orders for labs to be done. Specifically A1C and Cholesterol. She would ike a callback regarding this matter. Please advise

## 2022-08-07 NOTE — Patient Instructions (Addendum)
Brittney Williamson , Thank you for taking time to come for your Medicare Wellness Visit. I appreciate your ongoing commitment to your health goals. Please review the following plan we discussed and let me know if I can assist you in the future.   These are the goals we discussed:  Goals       Lose weight (pt-stated)      I would like to be 147 lbs        This is a list of the screening recommended for you and due dates:  Health Maintenance  Topic Date Due   Pneumonia Vaccine (1 of 2 - PCV) Never done   Complete foot exam   Never done   Eye exam for diabetics  Never done   DTaP/Tdap/Td vaccine (1 - Tdap) Never done   COVID-19 Vaccine (1) 08/23/2022*   Zoster (Shingles) Vaccine (1 of 2) 11/06/2022*   Yearly kidney health urinalysis for diabetes  09/16/2022   Flu Shot  11/22/2022   Hemoglobin A1C  01/16/2023   Yearly kidney function blood test for diabetes  02/13/2023   Medicare Annual Wellness Visit  08/07/2023   Mammogram  07/22/2024   Colon Cancer Screening  12/23/2026   DEXA scan (bone density measurement)  Completed   Hepatitis C Screening: USPSTF Recommendation to screen - Ages 79-79 yo.  Completed   HPV Vaccine  Aged Out  *Topic was postponed. The date shown is not the original due date.    Advanced directives: Advance directive discussed with you today. Even though you declined this today, please call our office should you change your mind, and we can give you the proper paperwork for you to fill out.   Conditions/risks identified: None  Next appointment: Follow up in one year for your annual wellness visit    Preventive Care 65 Years and Older, Female Preventive care refers to lifestyle choices and visits with your health care provider that can promote health and wellness. What does preventive care include? A yearly physical exam. This is also called an annual well check. Dental exams once or twice a year. Routine eye exams. Ask your health care provider how often you  should have your eyes checked. Personal lifestyle choices, including: Daily care of your teeth and gums. Regular physical activity. Eating a healthy diet. Avoiding tobacco and drug use. Limiting alcohol use. Practicing safe sex. Taking low-dose aspirin every day. Taking vitamin and mineral supplements as recommended by your health care provider. What happens during an annual well check? The services and screenings done by your health care provider during your annual well check will depend on your age, overall health, lifestyle risk factors, and family history of disease. Counseling  Your health care provider may ask you questions about your: Alcohol use. Tobacco use. Drug use. Emotional well-being. Home and relationship well-being. Sexual activity. Eating habits. History of falls. Memory and ability to understand (cognition). Work and work Astronomer. Reproductive health. Screening  You may have the following tests or measurements: Height, weight, and BMI. Blood pressure. Lipid and cholesterol levels. These may be checked every 5 years, or more frequently if you are over 75 years old. Skin check. Lung cancer screening. You may have this screening every year starting at age 48 if you have a 30-pack-year history of smoking and currently smoke or have quit within the past 15 years. Fecal occult blood test (FOBT) of the stool. You may have this test every year starting at age 69. Flexible sigmoidoscopy or colonoscopy. You  may have a sigmoidoscopy every 5 years or a colonoscopy every 10 years starting at age 38. Hepatitis C blood test. Hepatitis B blood test. Sexually transmitted disease (STD) testing. Diabetes screening. This is done by checking your blood sugar (glucose) after you have not eaten for a while (fasting). You may have this done every 1-3 years. Bone density scan. This is done to screen for osteoporosis. You may have this done starting at age 62. Mammogram. This may  be done every 1-2 years. Talk to your health care provider about how often you should have regular mammograms. Talk with your health care provider about your test results, treatment options, and if necessary, the need for more tests. Vaccines  Your health care provider may recommend certain vaccines, such as: Influenza vaccine. This is recommended every year. Tetanus, diphtheria, and acellular pertussis (Tdap, Td) vaccine. You may need a Td booster every 10 years. Zoster vaccine. You may need this after age 46. Pneumococcal 13-valent conjugate (PCV13) vaccine. One dose is recommended after age 24. Pneumococcal polysaccharide (PPSV23) vaccine. One dose is recommended after age 80. Talk to your health care provider about which screenings and vaccines you need and how often you need them. This information is not intended to replace advice given to you by your health care provider. Make sure you discuss any questions you have with your health care provider. Document Released: 05/06/2015 Document Revised: 12/28/2015 Document Reviewed: 02/08/2015 Elsevier Interactive Patient Education  2017 [place] Prevention in the Home Falls can cause injuries. They can happen to people of all ages. There are many things you can do to make your home safe and to help prevent falls. What can I do on the outside of my home? Regularly fix the edges of walkways and driveways and fix any cracks. Remove anything that might make you trip as you walk through a door, such as a raised step or threshold. Trim any bushes or trees on the path to your home. Use bright outdoor lighting. Clear any walking paths of anything that might make someone trip, such as rocks or tools. Regularly check to see if handrails are loose or broken. Make sure that both sides of any steps have handrails. Any raised decks and porches should have guardrails on the edges. Have any leaves, snow, or ice cleared regularly. Use sand or salt  on walking paths during winter. Clean up any spills in your garage right away. This includes oil or grease spills. What can I do in the bathroom? Use night lights. Install grab bars by the toilet and in the tub and shower. Do not use towel bars as grab bars. Use non-skid mats or decals in the tub or shower. If you need to sit down in the shower, use a plastic, non-slip stool. Keep the floor dry. Clean up any water that spills on the floor as soon as it happens. Remove soap buildup in the tub or shower regularly. Attach bath mats securely with double-sided non-slip rug tape. Do not have throw rugs and other things on the floor that can make you trip. What can I do in the bedroom? Use night lights. Make sure that you have a light by your bed that is easy to reach. Do not use any sheets or blankets that are too big for your bed. They should not hang down onto the floor. Have a firm chair that has side arms. You can use this for support while you get dressed. Do not have throw  rugs and other things on the floor that can make you trip. What can I do in the kitchen? Clean up any spills right away. Avoid walking on wet floors. Keep items that you use a lot in easy-to-reach places. If you need to reach something above you, use a strong step stool that has a grab bar. Keep electrical cords out of the way. Do not use floor polish or wax that makes floors slippery. If you must use wax, use non-skid floor wax. Do not have throw rugs and other things on the floor that can make you trip. What can I do with my stairs? Do not leave any items on the stairs. Make sure that there are handrails on both sides of the stairs and use them. Fix handrails that are broken or loose. Make sure that handrails are as long as the stairways. Check any carpeting to make sure that it is firmly attached to the stairs. Fix any carpet that is loose or worn. Avoid having throw rugs at the top or bottom of the stairs. If you  do have throw rugs, attach them to the floor with carpet tape. Make sure that you have a light switch at the top of the stairs and the bottom of the stairs. If you do not have them, ask someone to add them for you. What else can I do to help prevent falls? Wear shoes that: Do not have high heels. Have rubber bottoms. Are comfortable and fit you well. Are closed at the toe. Do not wear sandals. If you use a stepladder: Make sure that it is fully opened. Do not climb a closed stepladder. Make sure that both sides of the stepladder are locked into place. Ask someone to hold it for you, if possible. Clearly mark and make sure that you can see: Any grab bars or handrails. First and last steps. Where the edge of each step is. Use tools that help you move around (mobility aids) if they are needed. These include: Canes. Walkers. Scooters. Crutches. Turn on the lights when you go into a dark area. Replace any light bulbs as soon as they burn out. Set up your furniture so you have a clear path. Avoid moving your furniture around. If any of your floors are uneven, fix them. If there are any pets around you, be aware of where they are. Review your medicines with your doctor. Some medicines can make you feel dizzy. This can increase your chance of falling. Ask your doctor what other things that you can do to help prevent falls. This information is not intended to replace advice given to you by your health care provider. Make sure you discuss any questions you have with your health care provider. Document Released: 02/03/2009 Document Revised: 09/15/2015 Document Reviewed: 05/14/2014 Elsevier Interactive Patient Education  2017 [employer].

## 2022-08-07 NOTE — Telephone Encounter (Signed)
Returned call to patient.  Patient is requesting to have updated fasting labs prior to appt with Eligha Bridegroom, NP on 09/24/22.  Patient is asking if she can come in to have her lipid panel and hemoglobin A1c drawn. She states she was not fasting when she last had her A1c drawn and would like to recheck this.  Will forward to Dr. Anne Fu to review and advise.

## 2022-08-07 NOTE — Progress Notes (Signed)
Subjective:   Brittney Williamson is a 72 y.o. female who presents for Medicare Annual (Subsequent) preventive examination.  Review of Systems    Virtual Visit via Telephone Note  I connected with  Brittney Williamson on 08/07/22 at  8:15 AM EDT by telephone and verified that I am speaking with the correct person using two identifiers.  Location: Patient: Home Provider: Office Persons participating in the virtual visit: patient/Nurse Health Advisor   I discussed the limitations, risks, security and privacy concerns of performing an evaluation and management service by telephone and the availability of in person appointments. The patient expressed understanding and agreed to proceed.  Interactive audio and video telecommunications were attempted between this nurse and patient, however failed, due to patient having technical difficulties OR patient did not have access to video capability.  We continued and completed visit with audio only.  Some vital signs may be absent or patient reported.   Tillie Rung, LPN  Cardiac Risk Factors include: advanced age (>85men, >33 women);diabetes mellitus;hypertension     Objective:    Today's Vitals   08/07/22 0750  Weight: 189 lb (85.7 kg)  Height:  (1.575 m)   Body mass index is 34.57 kg/m.     08/07/2022    8:02 AM 05/16/2022   10:37 AM 06/02/2019    3:07 PM 09/05/2016    4:13 PM  Advanced Directives  Does Patient Have a Medical Advance Directive? No No No No  Would patient like information on creating a medical advance directive? No - Patient declined Yes (MAU/Ambulatory/Procedural Areas - Information given)      Current Medications (verified) Outpatient Encounter Medications as of 08/07/2022  Medication Sig   acetaminophen (TYLENOL) 325 MG tablet Take 2 tablets (650 mg total) by mouth every 6 (six) hours as needed.   acyclovir (ZOVIRAX) 400 MG tablet Take 400 mg by mouth as needed.   Ascorbic Acid (VITAMIN C) 500 MG CHEW Chew  by mouth daily. 2 gummies   aspirin 81 MG chewable tablet Chew 81 mg by mouth daily.   b complex vitamins capsule Take 1 capsule by mouth daily.   Coenzyme Q10 (COQ-10) 100 MG CAPS Take 1 capsule by mouth daily.   diclofenac Sodium (VOLTAREN ARTHRITIS PAIN) 1 % GEL Apply 4 g topically as needed.   dorzolamide-timolol (COSOPT) 2-0.5 % ophthalmic solution Place 1 drop into the right eye 2 (two) times daily.   fenofibrate (TRICOR) 145 MG tablet Take 1 tablet (145 mg total) by mouth daily. (Patient taking differently: Take 145 mg by mouth daily.)   KRILL OIL PO Take 750 mg by mouth daily.   Lactase (LACTAID PO) Take by mouth as needed.   melatonin 5 MG TABS Take 5 mg by mouth at bedtime as needed.   Menthol-Methyl Salicylate (SALONPAS PAIN RELIEF PATCH) 3-10 % PTCH Apply topically as needed.   naproxen sodium (ALEVE) 220 MG tablet Take 220 mg by mouth 2 (two) times daily as needed.   rosuvastatin (CRESTOR) 10 MG tablet Take 1 tablet (10 mg total) by mouth at bedtime. (Patient taking differently: Take 10 mg by mouth daily.)   Vitamin D-Vitamin K (VITAMIN K2-VITAMIN D3 PO) Take 1 capsule by mouth daily. Dosage  (d3)/  90 mg (k2)   Zinc 50 MG TABS Take 1 tablet by mouth once a week.   No facility-administered encounter medications on file as of 08/07/2022.    Allergies (verified) Lactose intolerance (gi), Lovastatin, Penicillins, Trimox [amoxicillin trihydrate], and  Amoxicillin   History: Past Medical History:  Diagnosis Date   Adopted    05-10-2022  per pt does not know biological family medical history   Amaurosis fugax of right eye 2021   Atherosclerosis of both lower extremities with intermittent claudication    vacular--- dr c. Chestine Spore   Chronically dry eyes, bilateral    Diet-controlled type 2 diabetes mellitus    Edema of both lower extremities    due to PAD   H/O mitral valve prolapse 1993   per pt told in 1993 had MVP,  per cardiologist--- dr Anne Fu , no evidence mvp per echo  in epic 09-07-2019   History of adenomatous polyp of colon    History of retinal tear    horseshow tear right eye  s/p cryoablation   Hypertension    followed by pcp and cardiologist   (cardiac CT  04-21-2021  calcium score zero   Lactose intolerance    Low back pain    05-10-2022  per pt pain just started   Mixed hyperlipidemia    followed by pcp and cardiologist  (dr Anne Fu)   OA (osteoarthritis)    OSA on CPAP 1993   followed by dr Vickey Huger;   first dx 1993;  restested 02-17-2016 in epic, AHI 45.7/hr   PMB (postmenopausal bleeding)    SUI (stress urinary incontinence, female)    Uterine fibroid    Past Surgical History:  Procedure Laterality Date   ANKLE ARTHROSCOPY Left    1980's;   for scar tissue post facture , no hardware   BREAST EXCISIONAL BIOPSY Left    1990;  1992;  1994  (all benign)   COLONOSCOPY  01/2022   DERMOID CYST  EXCISION     age 32--  per pt laparotomy unilateral fallopian/ ovarian dermoid cystectomy   DILATATION & CURETTAGE/HYSTEROSCOPY WITH MYOSURE N/A 05/16/2022   Procedure: DILATATION & CURETTAGE/HYSTEROSCOPY WITH MYOSURE XL;  Surgeon: Charlett Nose, MD;  Location: [facility];  Service: Gynecology;  Laterality: N/A;   KNEE ARTHROSCOPY Right 2005   KNEE ARTHROSCOPY WITH MEDIAL MENISECTOMY Left 05/27/2019   OPERATIVE ULTRASOUND N/A 05/16/2022   Procedure: OPERATIVE ULTRASOUND;  Surgeon: Charlett Nose, MD;  Location: [facility];  Service: Gynecology;  Laterality: N/A;   ORIF WRIST FRACTURE Right 06/2015   Family History  Adopted: Yes   Social History   Socioeconomic History   Marital status: Single    Spouse name: Not on file   Number of children: Not on file   Years of education: Not on file   Highest education level: Not on file  Occupational History   Not on file  Tobacco Use   Smoking status: Never   Smokeless tobacco: Never  Vaping Use   Vaping Use: Never used  Substance and Sexual Activity    Alcohol use: Not Currently    Comment: rare   Drug use: Never   Sexual activity: Not on file  Other Topics Concern   Not on file  Social History Narrative   Not on file   Social Determinants of Health   Financial Resource Strain: Low Risk  (08/07/2022)   Overall Financial Resource Strain (CARDIA)    Difficulty of Paying Living Expenses: Not hard at all  Food Insecurity: No Food Insecurity (08/07/2022)   Hunger Vital Sign    Worried About Running Out of Food in the Last Year: Never true    Ran Out of Food in the Last Year: Never  true  Transportation Needs: No Transportation Needs (08/07/2022)   PRAPARE - Administrator, Civil Service (Medical): No    Lack of Transportation (Non-Medical): No  Physical Activity: Insufficiently Active (08/07/2022)   Exercise Vital Sign    Days of Exercise per Week: 2 days    Minutes of Exercise per Session: 60 min  Stress: No Stress Concern Present (08/07/2022)   [employer] of Occupational Health - Occupational Stress Questionnaire    Feeling of Stress : Not at all  Social Connections: Moderately Integrated (08/07/2022)   Social Connection and Isolation Panel [NHANES]    Frequency of Communication with Friends and Family: More than three times a week    Frequency of Social Gatherings with Friends and Family: More than three times a week    Attends Religious Services: More than 4 times per year    Active Member of [employer] or Organizations: Yes    Attends Engineer, structural: More than 4 times per year    Marital Status: Never married    Tobacco Counseling Counseling given: Not Answered   Clinical Intake:  Pre-visit preparation completed: No  Pain : No/denies pain     BMI - recorded: 34.57 Nutritional Status: BMI > 30  Obese Nutritional Risks: None Diabetes: No  How often do you need to have someone help you when you read instructions, pamphlets, or other written materials from your doctor or pharmacy?: 1 -  Never  Diabetic?  Yes  Interpreter Needed?: NoNutrition Risk Assessment:  Has the patient had any N/V/D within the last 2 months?  No  Does the patient have any non-healing wounds?  No  Has the patient had any unintentional weight loss or weight gain?  No   Diabetes:  Is the patient diabetic?  Yes  If diabetic, was a CBG obtained today?  No  Did the patient bring in their glucometer from home?  No  How often do you monitor your CBG's? 2 x daily.   Financial Strains and Diabetes Management:  Are you having any financial strains with the device, your supplies or your medication? No .  Does the patient want to be seen by Chronic Care Management for management of their diabetes?  No  Would the patient like to be referred to a Nutritionist or for Diabetic Management?  No   Diabetic Exams:  Diabetic Eye Exam: Completed . Overdue for diabetic eye exam. Pt has been advised about the importance in completing this exam. A referral has been placed today. Message sent to referral coordinator for scheduling purposes. Advised pt to expect a call from office referred to regarding appt.  Diabetic Foot Exam: Completed . Pt has been advised about the importance in completing this exam. Pt is scheduled for diabetic foot exam on Followed by PCP.    Information entered by :: Theresa Mulligan LPN   Activities of Daily Living    08/07/2022    7:59 AM 07/16/2022   11:05 AM  In your present state of health, do you have any difficulty performing the following activities:  Hearing? 0 0  Vision? 0 0  Difficulty concentrating or making decisions? 0 0  Walking or climbing stairs? 0 0  Dressing or bathing? 0 0  Doing errands, shopping? 0 0  Preparing Food and eating ? N   Using the Toilet? N   In the past six months, have you accidently leaked urine? Y   Comment Wears pads, followed by PCP   Do you  have problems with loss of bowel control? N   Managing your Medications? N   Managing your Finances? N    Housekeeping or managing your Housekeeping? N     Patient Care Team: [place], Buren Kos, MD as PCP - General (Family Medicine) Jake Bathe, MD as PCP - Cardiology (Cardiology)  Indicate any recent Medical Services you may have received from other than [facility] providers in the past year (date may be approximate).     Assessment:   This is a routine wellness examination for Aurelia.  Hearing/Vision screen Hearing Screening - Comments:: Denies hearing difficulties   Vision Screening - Comments:: Wears rx glasses - up to date with routine eye exams with  [facility]  Dietary issues and exercise activities discussed: Exercise limited by: None identified   Goals Addressed               This Visit's Progress     Lose weight (pt-stated)        I would like to be 147 lbs       Depression Screen    08/07/2022    7:56 AM 07/16/2022   11:05 AM 11/10/2021    8:37 AM 09/15/2021    9:07 AM 07/24/2021    8:45 AM 01/23/2021    3:11 PM 04/14/2018    8:13 AM  PHQ 2/9 Scores  PHQ - 2 Score 0 0 0 0 1 4 0  PHQ- 9 Score   0 0  18   Exception Documentation  Medical reason Medical reason Medical reason Medical reason      Fall Risk    08/07/2022    8:01 AM 07/16/2022   11:05 AM 11/10/2021    8:37 AM 09/15/2021    9:07 AM 07/24/2021    8:43 AM  Fall Risk   Falls in the past year? 0 0 0 0 0  Number falls in past yr: 0 0 0 0 0  Injury with Fall? 0 0 0 0 0  Risk for fall due to : No Fall Risks No Fall Risks No Fall Risks No Fall Risks No Fall Risks  Follow up Falls prevention discussed Falls evaluation completed Falls evaluation completed Falls evaluation completed Falls evaluation completed    FALL RISK PREVENTION PERTAINING TO THE HOME:  Any stairs in or around the home? Yes  If so, are there any without handrails? No  Home free of loose throw rugs in walkways, pet beds, electrical cords, etc? Yes  Adequate lighting in your home to reduce risk of falls? Yes   ASSISTIVE DEVICES  UTILIZED TO PREVENT FALLS:  Life alert? No  Use of a cane, walker or w/c? No  Grab bars in the bathroom? Yes  Shower chair or bench in shower? Yes  Elevated toilet seat or a handicapped toilet? Yes   TIMED UP AND GO:  Was the test performed? No . Audio Visit   Cognitive Function:        08/07/2022    8:02 AM  6CIT Screen  What Year? 0 points  What month? 0 points  What time? 0 points  Count back from 20 0 points  Months in reverse 0 points  Repeat phrase 0 points  Total Score 0 points    Immunizations  There is no immunization history on file for this patient.  TDAP status: Due, Education has been provided regarding the importance of this vaccine. Advised may receive this vaccine at local pharmacy or Health Dept. Aware  to provide a copy of the vaccination record if obtained from local pharmacy or Health Dept. Verbalized acceptance and understanding.  Flu Vaccine status: Declined, Education has been provided regarding the importance of this vaccine but patient still declined. Advised may receive this vaccine at local pharmacy or Health Dept. Aware to provide a copy of the vaccination record if obtained from local pharmacy or Health Dept. Verbalized acceptance and understanding.  Pneumococcal vaccine status: Declined,  Education has been provided regarding the importance of this vaccine but patient still declined. Advised may receive this vaccine at local pharmacy or Health Dept. Aware to provide a copy of the vaccination record if obtained from local pharmacy or Health Dept. Verbalized acceptance and understanding.   Covid-19 vaccine status: Declined, Education has been provided regarding the importance of this vaccine but patient still declined. Advised may receive this vaccine at local pharmacy or Health Dept.or vaccine clinic. Aware to provide a copy of the vaccination record if obtained from local pharmacy or Health Dept. Verbalized acceptance and understanding.  Qualifies  for Shingles Vaccine? Yes   Zostavax completed No   Shingrix Completed?: No.    Education has been provided regarding the importance of this vaccine. Patient has been advised to call insurance company to determine out of pocket expense if they have not yet received this vaccine. Advised may also receive vaccine at local pharmacy or Health Dept. Verbalized acceptance and understanding.  Screening Tests Health Maintenance  Topic Date Due   Pneumonia Vaccine 79+ Years old (1 of 2 - PCV) Never done   FOOT EXAM  Never done   OPHTHALMOLOGY EXAM  Never done   DTaP/Tdap/Td (1 - Tdap) Never done   COVID-19 Vaccine (1) 08/23/2022 (Originally 05/05/1955)   Zoster Vaccines- Shingrix (1 of 2) 11/06/2022 (Originally 05/05/1969)   Diabetic kidney evaluation - Urine ACR  09/16/2022   INFLUENZA VACCINE  11/22/2022   HEMOGLOBIN A1C  01/16/2023   Diabetic kidney evaluation - eGFR measurement  02/13/2023   Medicare Annual Wellness (AWV)  08/07/2023   MAMMOGRAM  07/22/2024   COLONOSCOPY (Pts 45-52yrs Insurance coverage will need to be confirmed)  12/23/2026   DEXA SCAN  Completed   Hepatitis C Screening  Completed   HPV VACCINES  Aged Out    Health Maintenance  Health Maintenance Due  Topic Date Due   Pneumonia Vaccine 56+ Years old (1 of 2 - PCV) Never done   FOOT EXAM  Never done   OPHTHALMOLOGY EXAM  Never done   DTaP/Tdap/Td (1 - Tdap) Never done    Colorectal cancer screening: Type of screening: Colonoscopy. Completed 12/22/16. Repeat every 10 years  Mammogram status: Completed 07/23/22. Repeat every year  Bone Density status: Completed 07/19/21. Results reflect: Bone density results: OSTEOPOROSIS. Repeat every   years.  Lung Cancer Screening: (Low Dose CT Chest recommended if Age 22-80 years, 30 pack-year currently smoking OR have quit w/in 15years.) does not qualify.     Additional Screening:  Hepatitis C Screening: does qualify; Completed 10/11/17  Vision Screening: Recommended annual  ophthalmology exams for early detection of glaucoma and other disorders of the eye. Is the patient up to date with their annual eye exam?  Yes  Who is the provider or what is the name of the office in which the patient attends annual eye exams? [facility] If pt is not established with a provider, would they like to be referred to a provider to establish care? No .   Dental Screening: Recommended annual  dental exams for proper oral hygiene  Community Resource Referral / Chronic Care Management:  CRR required this visit?  No   CCM required this visit?  No      Plan:     I have personally reviewed and noted the following in the patient's chart:   Medical and social history Use of alcohol, tobacco or illicit drugs  Current medications and supplements including opioid prescriptions. Patient is not currently taking opioid prescriptions. Functional ability and status Nutritional status Physical activity Advanced directives List of other physicians Hospitalizations, surgeries, and ER visits in previous 12 months Vitals Screenings to include cognitive, depression, and falls Referrals and appointments  In addition, I have reviewed and discussed with patient certain preventive protocols, quality metrics, and best practice recommendations. A written personalized care plan for preventive services as well as general preventive health recommendations were provided to patient.     Tillie Rung, LPN   07/30/8117   Nurse Notes: None

## 2022-08-10 NOTE — Telephone Encounter (Signed)
Spoke with patient and informed her Dr. Anne Fu is agreeable to ordering fasting lipid panel and hemoglobin A1c.  Lab appt scheduled for 08/13/22.  Patient verbalized understanding and expressed appreciation for call.

## 2022-08-13 ENCOUNTER — Ambulatory Visit: Payer: Medicare Other | Attending: Cardiology

## 2022-08-13 DIAGNOSIS — E785 Hyperlipidemia, unspecified: Secondary | ICD-10-CM

## 2022-08-13 DIAGNOSIS — I70211 Atherosclerosis of native arteries of extremities with intermittent claudication, right leg: Secondary | ICD-10-CM

## 2022-08-13 DIAGNOSIS — E119 Type 2 diabetes mellitus without complications: Secondary | ICD-10-CM | POA: Diagnosis not present

## 2022-08-13 DIAGNOSIS — E782 Mixed hyperlipidemia: Secondary | ICD-10-CM | POA: Diagnosis not present

## 2022-08-14 ENCOUNTER — Telehealth: Payer: Self-pay

## 2022-08-14 LAB — LIPID PANEL
Chol/HDL Ratio: 4.1 ratio (ref 0.0–4.4)
Cholesterol, Total: 139 mg/dL (ref 100–199)
HDL: 34 mg/dL — ABNORMAL LOW (ref >39)
LDL Chol Calc (NIH): 59 mg/dL (ref 0–99)
Triglycerides: 296 mg/dL — ABNORMAL HIGH (ref 0–149)
VLDL Cholesterol Cal: 46 mg/dL — ABNORMAL HIGH (ref 5–40)

## 2022-08-14 LAB — HEMOGLOBIN A1C
Est. average glucose Bld gHb Est-mCnc: 160 mg/dL
Hgb A1c MFr Bld: 7.2 % — ABNORMAL HIGH (ref 4.8–5.6)

## 2022-08-14 NOTE — Telephone Encounter (Signed)
Lab results and recommendations from Dr Anne Fu reviewed with patient. Patient verbalized understanding and had no questions.

## 2022-08-14 NOTE — Telephone Encounter (Signed)
-----   Message from Jake Bathe, MD sent at 08/14/2022  8:14 AM EDT ----- Hemoglobin A1c 7.2.  Diabetic range.  I will forward to her PCP to discuss further treatment strategies in addition to diet and exercise. Donato Schultz, MD

## 2022-08-23 ENCOUNTER — Telehealth (HOSPITAL_BASED_OUTPATIENT_CLINIC_OR_DEPARTMENT_OTHER): Payer: Self-pay | Admitting: Family Medicine

## 2022-08-23 ENCOUNTER — Other Ambulatory Visit (HOSPITAL_BASED_OUTPATIENT_CLINIC_OR_DEPARTMENT_OTHER): Payer: Self-pay

## 2022-08-23 DIAGNOSIS — E119 Type 2 diabetes mellitus without complications: Secondary | ICD-10-CM

## 2022-08-23 MED ORDER — BLOOD GLUCOSE MONITORING SUPPL DEVI: 1.0000 | Freq: Three times a day (TID) | 0 refills | Status: AC

## 2022-08-23 MED ORDER — LANCET DEVICE MISC: 99 refills | Status: AC

## 2022-08-23 MED ORDER — LANCETS MISC. MISC: 99 refills | Status: AC

## 2022-08-23 MED ORDER — BLOOD GLUCOSE TEST VI STRP: ORAL_STRIP | 99 refills | Status: AC

## 2022-08-23 NOTE — Telephone Encounter (Signed)
Pt needs a script sent to [facility]  For Glucometer and Test strips

## 2022-08-23 NOTE — Telephone Encounter (Signed)
Glucose meter kit was sent into pharmacy on file.

## 2022-08-27 ENCOUNTER — Telehealth: Payer: Self-pay | Admitting: Cardiology

## 2022-08-27 NOTE — Telephone Encounter (Signed)
Called pt to inform her that she needed to contact her PCP for her glucose blood test strips, because we are a cardiologist and we do not follow her for her blood sugar. I advised the pt to give our office a call if she has any cardiac problems, questions or concerns. Pt verbalized understanding.

## 2022-08-27 NOTE — Telephone Encounter (Signed)
*  STAT* If patient is at the pharmacy, call can be transferred to refill team.   1. Which medications need to be refilled? (please list name of each medication and dose if known) Glucose Blood (BLOOD GLUCOSE TEST STRIPS) STRP    2. Which pharmacy/location (including street and city if local pharmacy) is medication to be sent to? [id number] - [place], [place] - [address]   3. Do they need a 30 day or 90 day supply? 90

## 2022-08-31 DIAGNOSIS — Z1283 Encounter for screening for malignant neoplasm of skin: Secondary | ICD-10-CM | POA: Diagnosis not present

## 2022-08-31 DIAGNOSIS — R21 Rash and other nonspecific skin eruption: Secondary | ICD-10-CM | POA: Diagnosis not present

## 2022-08-31 DIAGNOSIS — E119 Type 2 diabetes mellitus without complications: Secondary | ICD-10-CM | POA: Diagnosis not present

## 2022-08-31 DIAGNOSIS — I10 Essential (primary) hypertension: Secondary | ICD-10-CM | POA: Diagnosis not present

## 2022-09-23 NOTE — Progress Notes (Unsigned)
Cardiology Office Note:    Date:  09/24/2022   ID:  Brittney Williamson, DOB 01-Dec-1950, MRN [id number]  PCP:  de [place], [name], MD   [facility] Providers Cardiologist:  Donato Schultz, MD     Referring MD: de [place], Buren Kos, MD   Chief Complaint: follow-up hypertension, hyperlipidemia  History of Present Illness:    Brittney Williamson is a very pleasant 72 y.o. female with a hx of hypertension, hyperlipidemia, amaurosis fugax of right eye, PAD, and OSA on CPAP.   Referred to cardiology for very high BP. Had been told she had mitral valve prolapse in the past but mitral valve appeared normal on echo 09/07/2019.   She was last seen in our office by Dr. Anne Fu on 03/23/21.  She reported she had stopped Crestor and fenofibrate and was taking only dietary supplements. Coronary calcium score ordered for further risk stratification which was zero.  She was having leg pain and arterial studies revealed bilateral arterial disease. She was referred to [facility] and elected to manage medically with Pletal, aspirin, and statin.   Seen by  me on 09/21/21.Having bilateral leg claudication, tingling in toes by the end of the day, and frequent leg cramps. Wears compression hose daily. 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 Was seen by Dr. Chestine Spore with [facility] but is concerned about having vein grafts and is seeking a second opinion at [facility].  Did not feel that Pletal was initially very helpful, but is going to try it again. Recently diagnosed with diabetes with A1C of 6.5. Admits to high sugar diet. Reported SOB on exertion.  Does not exercise on a consistent basis but stays active on the days she is not working by doing yard work and playing with her puppy. No chest pain, fatigue, palpitations, presyncope, syncope, orthopnea, and PND. Compliant with CPAP but only sleeps 5 hours per night.  Today, she is here alone. Reports "I'm falling apart." Having abdominal bloating, pain, rash and itching  on both legs. Is compliant with CPAP but sleep is being disrupted by her puppy. Feels fatigued after driving all day, drives up to 10 hours for [employer] 20 lb unintentional weight gain over the past year per her report. When she mows her yard for 30 minutes, she has to rest before she can finish mowing another 30 min. BP at home as high as 186/96 mmHg. Legs hurt which limits walking, has arthritis, uses voltaren gel and Tylenol. Advised by [facility] that she is not a surgical candidate. Has bilateral edema, left > right. She denies shortness of breath, palpitations, chest pain, orthopnea, PND. Has bilateral cataracts, no amaurosis fugax. Continues to eat a fair amount of sodium, craves it. Also loves vegetables and eats low fat red meat when she eats it.   Past Medical History:  Diagnosis Date   Adopted    05-10-2022  per pt does not know biological family medical history   Amaurosis fugax of right eye 2021   Atherosclerosis of both lower extremities with intermittent claudication (HCC)    vacular--- dr c. Chestine Spore   Chronically dry eyes, bilateral    Diet-controlled type 2 diabetes mellitus (HCC)    Edema of both lower extremities    due to PAD   H/O mitral valve prolapse 1993   per pt told in 1993 had MVP,  per cardiologist--- dr Anne Fu , no evidence mvp per echo in epic 09-07-2019   History of  adenomatous polyp of colon    History of retinal tear    horseshow tear right eye  s/p cryoablation   Hypertension    followed by pcp and cardiologist   (cardiac CT  04-21-2021  calcium score zero   Lactose intolerance    Low back pain    05-10-2022  per pt pain just started   Mixed hyperlipidemia    followed by pcp and cardiologist  (dr Anne Fu)   OA (osteoarthritis)    OSA on CPAP 1993   followed by dr Vickey Huger;   first dx 1993;  restested 02-17-2016 in epic, AHI 45.7/hr   PMB (postmenopausal bleeding)    SUI (stress urinary incontinence, female)    Uterine fibroid     Past Surgical History:   Procedure Laterality Date   ANKLE ARTHROSCOPY Left    1980's;   for scar tissue post facture , no hardware   BREAST EXCISIONAL BIOPSY Left    1990;  1992;  1994  (all benign)   COLONOSCOPY  01/2022   DERMOID CYST  EXCISION     age 69--  per pt laparotomy unilateral fallopian/ ovarian dermoid cystectomy   DILATATION & CURETTAGE/HYSTEROSCOPY WITH MYOSURE N/A 05/16/2022   Procedure: DILATATION & CURETTAGE/HYSTEROSCOPY WITH MYOSURE XL;  Surgeon: Charlett Nose, MD;  Location: [facility];  Service: Gynecology;  Laterality: N/A;   KNEE ARTHROSCOPY Right 2005   KNEE ARTHROSCOPY WITH MEDIAL MENISECTOMY Left 05/27/2019   OPERATIVE ULTRASOUND N/A 05/16/2022   Procedure: OPERATIVE ULTRASOUND;  Surgeon: Charlett Nose, MD;  Location: [facility];  Service: Gynecology;  Laterality: N/A;   ORIF WRIST FRACTURE Right 06/2015    Current Medications: Current Meds  Medication Sig   acetaminophen (TYLENOL) 325 MG tablet Take 2 tablets (650 mg total) by mouth every 6 (six) hours as needed.   acyclovir (ZOVIRAX) 400 MG tablet Take 400 mg by mouth as needed.   Ascorbic Acid (VITAMIN C) 500 MG CHEW Chew by mouth daily. 2 gummies   aspirin 81 MG chewable tablet Chew 81 mg by mouth daily.   b complex vitamins capsule Take 1 capsule by mouth daily.   Blood Glucose Monitoring Suppl DEVI 1 each by Does not apply route in the morning, at noon, and at bedtime. May substitute to any manufacturer covered by patient's insurance.   CILOSTAZOL PO Take by mouth 2 (two) times daily.   Coenzyme Q10 (COQ-10) 100 MG CAPS Take 1 capsule by mouth daily.   diclofenac Sodium (VOLTAREN ARTHRITIS PAIN) 1 % GEL Apply 4 g topically as needed.   dorzolamide-timolol (COSOPT) 2-0.5 % ophthalmic solution Place 1 drop into the right eye 2 (two) times daily.   fenofibrate (TRICOR) 145 MG tablet Take 1 tablet (145 mg total) by mouth daily.   Glucose Blood (BLOOD GLUCOSE TEST STRIPS) STRP For  monitoring blood sugar levels up to 4 times a day. May substitute to any manufacturer covered by patient's insurance.   Lactase (LACTAID PO) Take by mouth as needed.   Lancet Device MISC For monitoring blood sugar up to 4 times a day. May substitute to any manufacturer covered by patient's insurance.   Lancets Misc. MISC For monitoring blood sugar up to 4 times a day. May substitute to any manufacturer covered by patient's insurance.   magnesium sulfate (EPSOM SALT) GRAN Weekly   melatonin 5 MG TABS Take 5 mg by mouth at bedtime as needed.   Menthol-Methyl Salicylate (SALONPAS PAIN RELIEF PATCH) 3-10 % PTCH Apply  topically as needed.   naproxen sodium (ALEVE) 220 MG tablet Take 220 mg by mouth 2 (two) times daily as needed.   rosuvastatin (CRESTOR) 10 MG tablet Take 1 tablet (10 mg total) by mouth at bedtime.   spironolactone (ALDACTONE) 25 MG tablet Take 1 tablet (25 mg total) by mouth daily.   triamcinolone cream (KENALOG) 0.1 % Apply 1 Application topically 2 (two) times daily.   Vitamin D-Vitamin K (VITAMIN K2-VITAMIN D3 PO) Take 1 capsule by mouth daily. Dosage  (d3)/  90 mg (k2)   Zinc 50 MG TABS Take 1 tablet by mouth once a week.     Allergies:   Lactose intolerance (gi), Lovastatin, Penicillins, Trimox [amoxicillin trihydrate], and Amoxicillin   Social History   Socioeconomic History   Marital status: Single    Spouse name: Not on file   Number of children: Not on file   Years of education: Not on file   Highest education level: Not on file  Occupational History   Not on file  Tobacco Use   Smoking status: Never   Smokeless tobacco: Never  Vaping Use   Vaping Use: Never used  Substance and Sexual Activity   Alcohol use: Not Currently    Comment: rare   Drug use: Never   Sexual activity: Not on file  Other Topics Concern   Not on file  Social History Narrative   Not on file   Social Determinants of Health   Financial Resource Strain: Low Risk  (08/07/2022)    Overall Financial Resource Strain (CARDIA)    Difficulty of Paying Living Expenses: Not hard at all  Food Insecurity: No Food Insecurity (08/07/2022)   Hunger Vital Sign    Worried About Running Out of Food in the Last Year: Never true    Ran Out of Food in the Last Year: Never true  Transportation Needs: No Transportation Needs (08/07/2022)   PRAPARE - Administrator, Civil Service (Medical): No    Lack of Transportation (Non-Medical): No  Physical Activity: Insufficiently Active (08/07/2022)   Exercise Vital Sign    Days of Exercise per Week: 2 days    Minutes of Exercise per Session: 60 min  Stress: No Stress Concern Present (08/07/2022)   [employer] of Occupational Health - Occupational Stress Questionnaire    Feeling of Stress : Not at all  Social Connections: Moderately Integrated (08/07/2022)   Social Connection and Isolation Panel [NHANES]    Frequency of Communication with Friends and Family: More than three times a week    Frequency of Social Gatherings with Friends and Family: More than three times a week    Attends Religious Services: More than 4 times per year    Active Member of [employer] or Organizations: Yes    Attends Engineer, structural: More than 4 times per year    Marital Status: Never married     Family History: The patient's family history is not on file. She was adopted.  ROS:   Please see the history of present illness.   + rash, redness, itching and pain bilateral LE + bilateral LE edema All other systems reviewed and are negative.  Labs/Other Studies Reviewed:    The following studies were reviewed today:  VAS [place] ABI 07/18/2021  Right: Resting right ankle-brachial index indicates moderate right lower  extremity arterial disease. The right toe-brachial index is abnormal.   Left: Resting left ankle-brachial index indicates mild left lower  extremity arterial disease. The  left toe-brachial index is abnormal.   Echo  09/07/2019  1. Normal LV systolic function; grade 1 diastolic dysfunction; mild LVH;  trace MR and TR.   2. Left ventricular ejection fraction, by estimation, is 60 to 65%. The  left ventricle has normal function. The left ventricle has no regional  wall motion abnormalities. There is mild left ventricular hypertrophy.  Left ventricular diastolic parameters  are consistent with Grade I diastolic dysfunction (impaired relaxation).   3. Right ventricular systolic function is normal. The right ventricular  size is normal. Tricuspid regurgitation signal is inadequate for assessing  PA pressure.   4. The mitral valve is normal in structure. Trivial mitral valve  regurgitation. No evidence of mitral stenosis.   5. The aortic valve is tricuspid. Aortic valve regurgitation is not  visualized. Mild aortic valve sclerosis is present, with no evidence of  aortic valve stenosis.   6. The inferior vena cava is normal in size with greater than 50%  respiratory variability, suggesting right atrial pressure of 3 mmHg.    CT Cardiac Score 04/21/21  Coronary calcium score of 0.   VAS US Carotid Duplex  Right Carotid: Velocities in the right ICA are consistent with a 1-39%  stenosis.   Left Carotid: There was no evidence of thrombus, dissection,  atherosclerotic plaque or stenosis in the cervical carotid system. Minimum  plaque in the external carotid artery.   Vertebrals:  Bilateral vertebral arteries demonstrate antegrade flow.  Subclavians: Normal flow hemodynamics were seen in bilateral subclavian  arteries.   Recent Labs: 02/12/2022: ALT 28 05/16/2022: BUN 23; Creatinine, Ser 0.80; Hemoglobin 15.0; Platelets 270; Potassium 4.9; Sodium 140  Recent Lipid Panel    Component Value Date/Time   CHOL 139 08/13/2022 0816   TRIG 296 (H) 08/13/2022 0816   HDL 34 (L) 08/13/2022 0816   CHOLHDL 4.1 08/13/2022 0816   CHOLHDL 6 04/14/2018 0919   VLDL 67.8 (H) 04/14/2018 0919   LDLCALC 59  08/13/2022 0816   LDLDIRECT 78 11/06/2019 0832   LDLDIRECT 57.0 04/14/2018 0919     Risk Assessment/Calculations:         Physical Exam:    VS:  BP 138/86   Pulse 92   Ht 5\' 2"  (1.575 m)   Wt 192 lb 6.4 oz (87.3 kg)   SpO2 95%   BMI 35.19 kg/m     Wt Readings from Last 3 Encounters:  09/24/22 192 lb 6.4 oz (87.3 kg)  08/07/22 189 lb (85.7 kg)  07/16/22 195 lb (88.5 kg)     GEN:  Well nourished, well developed in no acute distress HEENT: Normal NECK: No JVD; No carotid bruits CARDIAC: RRR, no murmurs, rubs, gallops RESPIRATORY:  Clear to auscultation without rales, wheezing or rhonchi  ABDOMEN: Soft, non-tender, non-distended MUSCULOSKELETAL: In compression stockings. No deformity. 2+ pedal pulses, equal bilaterally SKIN: Warm and dry. Diffuse redness and itching with patches of redness  NEUROLOGIC:  Alert and oriented x 3 PSYCHIATRIC:  Normal affect   EKG:  EKG is not ordered today  Diagnoses:    1. LVH (left ventricular hypertrophy)   2. Hyperlipidemia LDL goal <70   3. PAD (peripheral artery disease) (HCC)   4. Essential hypertension   5. Pain in both lower extremities   6. OSA on CPAP   7. Diabetes mellitus without complication (HCC)     Assessment and Plan:     Hypertension: LVH on echo 09/07/2019. BP is well controlled here but home readings and readings at  other recent provider visits not well controlled. Agreeable to proceed with starting antihypertensive medication but does not want to take a strong diuretic. We will have her start spironolactone 25 mg daily. Will get repeat echocardiogram to evaluate LV function. Will get BMP in 7-10 days. Encouraged routine home monitoring and report concerns prior to next appointment. Encouraged low sodium diet.   Hyperlipidemia: LDL 59 on 08/13/22. Encouraged 150 minutes moderate intensity exercise each week and heart healthy, mostly plant based diet. Continue Crestor.  Diabetes: A1C 7.2 on 08/13/22. Emphasized the  importance of mostly plant-based diet and regular physical activity. Management per PCP.   OSA: Reports compliance with CPAP but is awakened frequently by her puppy. Management per neurology.   PAD: Evidence of bilateral lower extremity vascular disease on testing 03/2021. Continues to have s/s of claudication being treated with medical therapy. Recommended follow-up with [facility].   Carotid artery disease: Mild right carotid artery plaque by ultrasound 08/2019. History of isolated episode of amaurosis fugax, no further episodes recently. LDL is at goal. Continue Crestor.  Disposition: 6 months with Dr. Anne Fu   Medication Adjustments/Labs and Tests Ordered: Current medicines are reviewed at length with the patient today.  Concerns regarding medicines are outlined above.  Orders Placed This Encounter  Procedures   Basic Metabolic Panel (BMET)   ECHOCARDIOGRAM COMPLETE   Meds ordered this encounter  Medications   spironolactone (ALDACTONE) 25 MG tablet    Sig: Take 1 tablet (25 mg total) by mouth daily.    Dispense:  30 tablet    Refill:  11    Patient Instructions  Medication Instructions:   START Aldactone one (1) tablet by mouth ( 25 mg) daily.   *If you need a refill on your cardiac medications before your next appointment, please call your pharmacy*   Lab Work:  Your physician recommends that you return for lab work Friday, June 14. You can come in on the day of your appointment anytime between 7:30-4:30.   If you have labs (blood work) drawn today and your tests are completely normal, you will receive your results only by: MyChart Message (if you have MyChart) OR A paper copy in the mail If you have any lab test that is abnormal or we need to change your treatment, we will call you to review the results.   Testing/Procedures:  Your physician has requested that you have an echocardiogram. Echocardiography is a painless test that uses sound waves to create images of your  heart. It provides your doctor with information about the size and shape of your heart and how well your heart's chambers and valves are working. This procedure takes approximately one hour. There are no restrictions for this procedure. Please do NOT wear perfume or lotions (deodorant is allowed). Please arrive 15 minutes prior to your appointment time.    Follow-Up: At [facility], you and your health needs are our priority.  As part of our continuing mission to provide you with exceptional heart care, we have created designated Provider Care Teams.  These Care Teams include your primary Cardiologist (physician) and Advanced Practice Providers (APPs -  Physician Assistants and Nurse Practitioners) who all work together to provide you with the care you need, when you need it.  We recommend signing up for the patient portal called "MyChart".  Sign up information is provided on this After Visit Summary.  MyChart is used to connect with patients for Virtual Visits (Telemedicine).  Patients are able to view lab/test  results, encounter notes, upcoming appointments, etc.  Non-urgent messages can be sent to your provider as well.   To learn more about what you can do with MyChart, go to [email or web address].    Your next appointment:   6 month(s)  Provider:   Donato Schultz, MD       Signed, Levi Aland, NP  09/24/2022 12:18 PM    [facility]

## 2022-09-24 ENCOUNTER — Encounter: Payer: Self-pay | Admitting: Nurse Practitioner

## 2022-09-24 ENCOUNTER — Ambulatory Visit: Payer: Medicare Other | Attending: Nurse Practitioner | Admitting: Nurse Practitioner

## 2022-09-24 VITALS — BP 138/86 | HR 92 | Ht 62.0 in | Wt 192.4 lb

## 2022-09-24 DIAGNOSIS — G4733 Obstructive sleep apnea (adult) (pediatric): Secondary | ICD-10-CM | POA: Diagnosis not present

## 2022-09-24 DIAGNOSIS — M79604 Pain in right leg: Secondary | ICD-10-CM | POA: Insufficient documentation

## 2022-09-24 DIAGNOSIS — E119 Type 2 diabetes mellitus without complications: Secondary | ICD-10-CM | POA: Insufficient documentation

## 2022-09-24 DIAGNOSIS — E785 Hyperlipidemia, unspecified: Secondary | ICD-10-CM | POA: Insufficient documentation

## 2022-09-24 DIAGNOSIS — I10 Essential (primary) hypertension: Secondary | ICD-10-CM | POA: Diagnosis not present

## 2022-09-24 DIAGNOSIS — I739 Peripheral vascular disease, unspecified: Secondary | ICD-10-CM | POA: Insufficient documentation

## 2022-09-24 DIAGNOSIS — M79605 Pain in left leg: Secondary | ICD-10-CM | POA: Insufficient documentation

## 2022-09-24 DIAGNOSIS — I517 Cardiomegaly: Secondary | ICD-10-CM | POA: Diagnosis not present

## 2022-09-24 MED ORDER — SPIRONOLACTONE 25 MG PO TABS: 25.0000 mg | ORAL_TABLET | Freq: Every day | ORAL | 11 refills | Status: AC

## 2022-09-24 NOTE — Patient Instructions (Signed)
Medication Instructions:   START Aldactone one (1) tablet by mouth ( 25 mg) daily.   *If you need a refill on your cardiac medications before your next appointment, please call your pharmacy*   Lab Work:  Your physician recommends that you return for lab work Friday, June 14. You can come in on the day of your appointment anytime between 7:30-4:30.   If you have labs (blood work) drawn today and your tests are completely normal, you will receive your results only by: MyChart Message (if you have MyChart) OR A paper copy in the mail If you have any lab test that is abnormal or we need to change your treatment, we will call you to review the results.   Testing/Procedures:  Your physician has requested that you have an echocardiogram. Echocardiography is a painless test that uses sound waves to create images of your heart. It provides your doctor with information about the size and shape of your heart and how well your heart's chambers and valves are working. This procedure takes approximately one hour. There are no restrictions for this procedure. Please do NOT wear perfume or lotions (deodorant is allowed). Please arrive 15 minutes prior to your appointment time.    Follow-Up: At [facility], you and your health needs are our priority.  As part of our continuing mission to provide you with exceptional heart care, we have created designated Provider Care Teams.  These Care Teams include your primary Cardiologist (physician) and Advanced Practice Providers (APPs -  Physician Assistants and Nurse Practitioners) who all work together to provide you with the care you need, when you need it.  We recommend signing up for the patient portal called "MyChart".  Sign up information is provided on this After Visit Summary.  MyChart is used to connect with patients for Virtual Visits (Telemedicine).  Patients are able to view lab/test results, encounter notes, upcoming appointments, etc.   Non-urgent messages can be sent to your provider as well.   To learn more about what you can do with MyChart, go to [email or web address].    Your next appointment:   6 month(s)  Provider:   Donato Schultz, MD

## 2022-09-26 DIAGNOSIS — I10 Essential (primary) hypertension: Secondary | ICD-10-CM | POA: Diagnosis not present

## 2022-09-26 DIAGNOSIS — Z1283 Encounter for screening for malignant neoplasm of skin: Secondary | ICD-10-CM | POA: Diagnosis not present

## 2022-09-26 DIAGNOSIS — R21 Rash and other nonspecific skin eruption: Secondary | ICD-10-CM | POA: Diagnosis not present

## 2022-09-26 DIAGNOSIS — E119 Type 2 diabetes mellitus without complications: Secondary | ICD-10-CM | POA: Diagnosis not present

## 2022-10-05 ENCOUNTER — Ambulatory Visit: Payer: Medicare Other | Attending: Family Medicine

## 2022-10-19 ENCOUNTER — Ambulatory Visit (HOSPITAL_BASED_OUTPATIENT_CLINIC_OR_DEPARTMENT_OTHER): Payer: 59 | Admitting: Family Medicine

## 2022-10-22 ENCOUNTER — Ambulatory Visit (HOSPITAL_COMMUNITY): Payer: Medicare Other | Attending: Cardiovascular Disease

## 2022-10-22 DIAGNOSIS — E785 Hyperlipidemia, unspecified: Secondary | ICD-10-CM

## 2022-10-22 DIAGNOSIS — I517 Cardiomegaly: Secondary | ICD-10-CM

## 2022-10-22 DIAGNOSIS — I10 Essential (primary) hypertension: Secondary | ICD-10-CM

## 2022-10-22 DIAGNOSIS — I739 Peripheral vascular disease, unspecified: Secondary | ICD-10-CM

## 2022-10-22 LAB — ECHOCARDIOGRAM COMPLETE
Area-P 1/2: 3.68 cm2
S' Lateral: 2 cm

## 2022-10-23 ENCOUNTER — Encounter (HOSPITAL_BASED_OUTPATIENT_CLINIC_OR_DEPARTMENT_OTHER): Payer: Self-pay | Admitting: Family Medicine

## 2022-11-19 DIAGNOSIS — Z1321 Encounter for screening for nutritional disorder: Secondary | ICD-10-CM | POA: Diagnosis not present

## 2022-11-19 DIAGNOSIS — I10 Essential (primary) hypertension: Secondary | ICD-10-CM | POA: Diagnosis not present

## 2022-11-19 DIAGNOSIS — I872 Venous insufficiency (chronic) (peripheral): Secondary | ICD-10-CM | POA: Diagnosis not present

## 2022-11-19 DIAGNOSIS — Z780 Asymptomatic menopausal state: Secondary | ICD-10-CM | POA: Diagnosis not present

## 2022-11-19 DIAGNOSIS — E119 Type 2 diabetes mellitus without complications: Secondary | ICD-10-CM | POA: Diagnosis not present

## 2022-11-19 DIAGNOSIS — R252 Cramp and spasm: Secondary | ICD-10-CM | POA: Diagnosis not present

## 2022-11-19 DIAGNOSIS — I70212 Atherosclerosis of native arteries of extremities with intermittent claudication, left leg: Secondary | ICD-10-CM | POA: Diagnosis not present

## 2022-12-06 DIAGNOSIS — L309 Dermatitis, unspecified: Secondary | ICD-10-CM | POA: Diagnosis not present

## 2022-12-28 ENCOUNTER — Other Ambulatory Visit: Payer: Self-pay

## 2022-12-28 MED ORDER — FENOFIBRATE 145 MG PO TABS: 145.0000 mg | ORAL_TABLET | Freq: Every day | ORAL | 2 refills | Status: DC

## 2022-12-31 ENCOUNTER — Other Ambulatory Visit: Payer: Self-pay

## 2022-12-31 MED ORDER — ROSUVASTATIN CALCIUM 10 MG PO TABS: 10.0000 mg | ORAL_TABLET | Freq: Every day | ORAL | 2 refills | Status: DC

## 2023-01-07 DIAGNOSIS — L309 Dermatitis, unspecified: Secondary | ICD-10-CM | POA: Diagnosis not present

## 2023-01-10 ENCOUNTER — Other Ambulatory Visit: Payer: Self-pay | Admitting: *Deleted

## 2023-01-10 DIAGNOSIS — I70213 Atherosclerosis of native arteries of extremities with intermittent claudication, bilateral legs: Secondary | ICD-10-CM

## 2023-01-11 DIAGNOSIS — E119 Type 2 diabetes mellitus without complications: Secondary | ICD-10-CM | POA: Diagnosis not present

## 2023-01-21 NOTE — Progress Notes (Unsigned)
Patient name: Brittney Williamson MRN: [id number] DOB: 1951-03-31 Sex: female  REASON FOR CONSULT: 1 year follow-up right lower extremity claudication  HPI: Brittney Williamson is a 72 y.o. female, with history of hypertension, DM, and intermittent lower extremity claudication that presents for 1 year follow-up of lower extremity claudication.  She previously described burning and a charley horse in the right calf when she walks.  We recommended conservative therapy at the initial visit.  On last visit was having no right lower extremity symptoms.  Today states she does get some cramping in her right calf usually about once a week.  She also complains of bilateral lower extremity swelling.  Knee pain and she does have some dermatitis.  She stopped taking the Pletal after prescription ran out.  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    Past Medical History:  Diagnosis Date   Adopted    05-10-2022  per pt does not know biological family medical history   Amaurosis fugax of right eye 2021   Atherosclerosis of both lower extremities with intermittent claudication (HCC)    vacular--- dr c. Chestine Spore   Chronically dry eyes, bilateral    Diet-controlled type 2 diabetes mellitus (HCC)    Edema of both lower extremities    due to PAD   H/O mitral valve prolapse 1993   per pt told in 1993 had MVP,  per cardiologist--- dr Anne Fu , no evidence mvp per echo in epic 09-07-2019   History of adenomatous polyp of colon    History of retinal tear    horseshow tear right eye  s/p cryoablation   Hypertension    followed by pcp and cardiologist   (cardiac CT  04-21-2021  calcium score zero   Lactose intolerance    Low back pain    05-10-2022  per pt pain just started   Mixed hyperlipidemia    followed by pcp and cardiologist  (dr Anne Fu)   OA (osteoarthritis)    OSA on CPAP 1993   followed by dr Vickey Huger;   first dx 1993;  restested 02-17-2016 in epic, AHI 45.7/hr   PMB (postmenopausal bleeding)    SUI  (stress urinary incontinence, female)    Uterine fibroid     Past Surgical History:  Procedure Laterality Date   ANKLE ARTHROSCOPY Left    1980's;   for scar tissue post facture , no hardware   BREAST EXCISIONAL BIOPSY Left    1990;  1992;  1994  (all benign)   COLONOSCOPY  01/2022   DERMOID CYST  EXCISION     age 68--  per pt laparotomy unilateral fallopian/ ovarian dermoid cystectomy   DILATATION & CURETTAGE/HYSTEROSCOPY WITH MYOSURE N/A 05/16/2022   Procedure: DILATATION & CURETTAGE/HYSTEROSCOPY WITH MYOSURE XL;  Surgeon: Charlett Nose, MD;  Location: [facility];  Service: Gynecology;  Laterality: N/A;   KNEE ARTHROSCOPY Right 2005   KNEE ARTHROSCOPY WITH MEDIAL MENISECTOMY Left 05/27/2019   OPERATIVE ULTRASOUND N/A 05/16/2022   Procedure: OPERATIVE ULTRASOUND;  Surgeon: Charlett Nose, MD;  Location: [facility];  Service: Gynecology;  Laterality: N/A;   ORIF WRIST FRACTURE Right 06/2015    Family History  Adopted: Yes    SOCIAL HISTORY: Social History   Socioeconomic History   Marital status: Single    Spouse name: Not on file   Number of children: Not on file   Years of education: Not on file   Highest education level: Not on  file  Occupational History   Not on file  Tobacco Use   Smoking status: Never   Smokeless tobacco: Never  Vaping Use   Vaping status: Never Used  Substance and Sexual Activity   Alcohol use: Not Currently    Comment: rare   Drug use: Never   Sexual activity: Not on file  Other Topics Concern   Not on file  Social History Narrative   Not on file   Social Determinants of Health   Financial Resource Strain: Low Risk  (08/07/2022)   Overall Financial Resource Strain (CARDIA)    Difficulty of Paying Living Expenses: Not hard at all  Food Insecurity: Low Risk  (11/19/2022)   Received from [facility]   Hunger Vital Sign    Worried About Running Out of Food in the Last Year: Never true    Ran  Out of Food in the Last Year: Never true  Transportation Needs: Not on file (11/19/2022)  Physical Activity: Insufficiently Active (08/07/2022)   Exercise Vital Sign    Days of Exercise per Week: 2 days    Minutes of Exercise per Session: 60 min  Stress: No Stress Concern Present (08/07/2022)   [employer] of Occupational Health - Occupational Stress Questionnaire    Feeling of Stress : Not at all  Social Connections: Moderately Integrated (08/07/2022)   Social Connection and Isolation Panel [NHANES]    Frequency of Communication with Friends and Family: More than three times a week    Frequency of Social Gatherings with Friends and Family: More than three times a week    Attends Religious Services: More than 4 times per year    Active Member of [employer] or Organizations: Yes    Attends Banker Meetings: More than 4 times per year    Marital Status: Never married  Intimate Partner Violence: Not At Risk (08/07/2022)   Humiliation, Afraid, Rape, and Kick questionnaire    Fear of Current or Ex-Partner: No    Emotionally Abused: No    Physically Abused: No    Sexually Abused: No    Allergies  Allergen Reactions   Lactose Intolerance (Gi)    Lovastatin     Myalgia and severe fatigue   Penicillins Itching   Trimox [Amoxicillin Trihydrate] Itching   Amoxicillin Rash    Current Outpatient Medications  Medication Sig Dispense Refill   acetaminophen (TYLENOL) 325 MG tablet Take 2 tablets (650 mg total) by mouth every 6 (six) hours as needed.     acyclovir (ZOVIRAX) 400 MG tablet Take 400 mg by mouth as needed.     Ascorbic Acid (VITAMIN C) 500 MG CHEW Chew by mouth daily. 2 gummies     aspirin 81 MG chewable tablet Chew 81 mg by mouth daily.     b complex vitamins capsule Take 1 capsule by mouth daily.     Blood Glucose Monitoring Suppl DEVI 1 each by Does not apply route in the morning, at noon, and at bedtime. May substitute to any manufacturer covered by patient's  insurance. 1 each 0   CILOSTAZOL PO Take by mouth 2 (two) times daily.     Coenzyme Q10 (COQ-10) 100 MG CAPS Take 1 capsule by mouth daily.     diclofenac Sodium (VOLTAREN ARTHRITIS PAIN) 1 % GEL Apply 4 g topically as needed.     dorzolamide-timolol (COSOPT) 2-0.5 % ophthalmic solution Place 1 drop into the right eye 2 (two) times daily.     fenofibrate (TRICOR) 145 MG  tablet Take 1 tablet (145 mg total) by mouth daily. 90 tablet 2   Glucose Blood (BLOOD GLUCOSE TEST STRIPS) STRP For monitoring blood sugar levels up to 4 times a day. May substitute to any manufacturer covered by patient's insurance. 200 strip 99   Lactase (LACTAID PO) Take by mouth as needed.     Lancet Device MISC For monitoring blood sugar up to 4 times a day. May substitute to any manufacturer covered by patient's insurance. 200 each 99   Lancets Misc. MISC For monitoring blood sugar up to 4 times a day. May substitute to any manufacturer covered by patient's insurance. 200 each 99   magnesium sulfate (EPSOM SALT) GRAN Weekly     melatonin 5 MG TABS Take 5 mg by mouth at bedtime as needed.     Menthol-Methyl Salicylate (SALONPAS PAIN RELIEF PATCH) 3-10 % PTCH Apply topically as needed.     naproxen sodium (ALEVE) 220 MG tablet Take 220 mg by mouth 2 (two) times daily as needed.     rosuvastatin (CRESTOR) 10 MG tablet Take 1 tablet (10 mg total) by mouth at bedtime. 90 tablet 2   spironolactone (ALDACTONE) 25 MG tablet Take 1 tablet (25 mg total) by mouth daily. 30 tablet 11   triamcinolone cream (KENALOG) 0.1 % Apply 1 Application topically 2 (two) times daily.     Vitamin D-Vitamin K (VITAMIN K2-VITAMIN D3 PO) Take 1 capsule by mouth daily. Dosage  (d3)/  90 mg (k2)     Zinc 50 MG TABS Take 1 tablet by mouth once a week.     No current facility-administered medications for this visit.    REVIEW OF SYSTEMS:  [X]  denotes positive finding, [ ]  denotes negative finding Cardiac  Comments:  Chest pain or chest  pressure:    Shortness of breath upon exertion:    Short of breath when lying flat:    Irregular heart rhythm:        Vascular    Pain in calf, thigh, or hip brought on by ambulation: x Right, intermittent  Pain in feet at night that wakes you up from your sleep:     Blood clot in your veins:    Leg swelling:  x       Pulmonary    Oxygen at home:    Productive cough:     Wheezing:         Neurologic    Sudden weakness in arms or legs:     Sudden numbness in arms or legs:     Sudden onset of difficulty speaking or slurred speech:    Temporary loss of vision in one eye:     Problems with dizziness:         Gastrointestinal    Blood in stool:     Vomited blood:         Genitourinary    Burning when urinating:     Blood in urine:        Psychiatric    Major depression:         Hematologic    Bleeding problems:    Problems with blood clotting too easily:        Skin    Rashes or ulcers:        Constitutional    Fever or chills:      PHYSICAL EXAM: There were no vitals filed for this visit.   GENERAL: The patient is a well-nourished female, in no acute distress. The  vital signs are documented above. CARDIAC: There is a regular rate and rhythm.  VASCULAR:  Palpable femoral pulses bilaterally No palpable pedal pulses No lower extremity tissue loss PULMONARY: No respiratory distress ABDOMEN: Soft and non-tender. MUSCULOSKELETAL: There are no major deformities or cyanosis. NEUROLOGIC: No focal weakness or paresthesias are detected. PSYCHIATRIC: The patient has a normal affect.  DATA:   ABI's today 0.67 right monophasic (previously 0.69) and 0.87 left biphasic (stable)  Lower extremity arterial duplex 04/05/2021 shows occlusion of the distal right popliteal artery and TP trunk.  Left lower extremity has a total occlusion of the anterior tibial and segmental occlusion of the posterior tibial.   Assessment/Plan:  72 year old female presents for 1 year  follow-up of her right lower extremity claudication.  She is having some intermittent symptoms in the right leg where she gets some calf cramping about once a week.  Overall this does not seem debilitating.  She is able to mow her backyard.  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  She has had no progression of symptoms over the last year.  Discussed we reserve intervention for claudication whenever this becomes lifestyle-limiting.  Otherwise we treat this with medical therapy.  Advised that she continue aspirin and statin.  Continue walking therapies.  I will repeat fill her Pletal.  I think medical therapy is best for her at this time.  Will see in 1 year with repeat ABIs.   Cephus Shelling, MD [facility] Office: [phone]

## 2023-01-22 ENCOUNTER — Ambulatory Visit (INDEPENDENT_AMBULATORY_CARE_PROVIDER_SITE_OTHER): Payer: Medicare Other | Admitting: Vascular Surgery

## 2023-01-22 ENCOUNTER — Encounter: Payer: Self-pay | Admitting: Vascular Surgery

## 2023-01-22 ENCOUNTER — Ambulatory Visit (HOSPITAL_COMMUNITY)
Admission: RE | Admit: 2023-01-22 | Discharge: 2023-01-22 | Disposition: A | Discharge status: Home / Self Care | Payer: Medicare Other | Source: Ambulatory Visit | Attending: Vascular Surgery | Admitting: Vascular Surgery

## 2023-01-22 VITALS — BP 158/99 | HR 80 | Temp 97.9°F | Resp 18 | Ht 62.0 in | Wt 190.1 lb

## 2023-01-22 DIAGNOSIS — I70213 Atherosclerosis of native arteries of extremities with intermittent claudication, bilateral legs: Secondary | ICD-10-CM | POA: Diagnosis not present

## 2023-01-22 LAB — VAS US ABI WITH/WO TBI
Left ABI: 0.87
Right ABI: 0.67

## 2023-01-22 MED ORDER — CILOSTAZOL 100 MG PO TABS: 100.0000 mg | ORAL_TABLET | Freq: Two times a day (BID) | ORAL | 11 refills | Status: DC

## 2023-02-05 DIAGNOSIS — E119 Type 2 diabetes mellitus without complications: Secondary | ICD-10-CM | POA: Diagnosis not present

## 2023-02-08 ENCOUNTER — Other Ambulatory Visit: Payer: Self-pay

## 2023-02-08 DIAGNOSIS — I70213 Atherosclerosis of native arteries of extremities with intermittent claudication, bilateral legs: Secondary | ICD-10-CM

## 2023-02-18 DIAGNOSIS — H33311 Horseshoe tear of retina without detachment, right eye: Secondary | ICD-10-CM | POA: Diagnosis not present

## 2023-02-18 DIAGNOSIS — H25813 Combined forms of age-related cataract, bilateral: Secondary | ICD-10-CM | POA: Diagnosis not present

## 2023-03-01 DIAGNOSIS — I10 Essential (primary) hypertension: Secondary | ICD-10-CM | POA: Diagnosis not present

## 2023-03-01 DIAGNOSIS — E119 Type 2 diabetes mellitus without complications: Secondary | ICD-10-CM | POA: Diagnosis not present

## 2023-03-08 DIAGNOSIS — H04123 Dry eye syndrome of bilateral lacrimal glands: Secondary | ICD-10-CM | POA: Diagnosis not present

## 2023-03-08 DIAGNOSIS — H33311 Horseshoe tear of retina without detachment, right eye: Secondary | ICD-10-CM | POA: Diagnosis not present

## 2023-03-08 DIAGNOSIS — H2513 Age-related nuclear cataract, bilateral: Secondary | ICD-10-CM | POA: Diagnosis not present

## 2023-03-11 ENCOUNTER — Ambulatory Visit: Payer: Medicare Other | Attending: Cardiology | Admitting: Cardiology

## 2023-03-11 ENCOUNTER — Encounter: Payer: Self-pay | Admitting: Cardiology

## 2023-03-11 VITALS — BP 138/76 | HR 98 | Ht 62.0 in | Wt 188.6 lb

## 2023-03-11 DIAGNOSIS — E782 Mixed hyperlipidemia: Secondary | ICD-10-CM

## 2023-03-11 DIAGNOSIS — E119 Type 2 diabetes mellitus without complications: Secondary | ICD-10-CM

## 2023-03-11 DIAGNOSIS — I739 Peripheral vascular disease, unspecified: Secondary | ICD-10-CM

## 2023-03-11 DIAGNOSIS — I517 Cardiomegaly: Secondary | ICD-10-CM

## 2023-03-11 DIAGNOSIS — G4733 Obstructive sleep apnea (adult) (pediatric): Secondary | ICD-10-CM

## 2023-03-11 NOTE — Patient Instructions (Signed)
Medication Instructions:  The current medical regimen is effective;  continue present plan and medications.  *If you need a refill on your cardiac medications before your next appointment, please call your pharmacy*  Follow-Up: At [facility], you and your health needs are our priority.  As part of our continuing mission to provide you with exceptional heart care, we have created designated Provider Care Teams.  These Care Teams include your primary Cardiologist (physician) and Advanced Practice Providers (APPs -  Physician Assistants and Nurse Practitioners) who all work together to provide you with the care you need, when you need it.  We recommend signing up for the patient portal called "MyChart".  Sign up information is provided on this After Visit Summary.  MyChart is used to connect with patients for Virtual Visits (Telemedicine).  Patients are able to view lab/test results, encounter notes, upcoming appointments, etc.  Non-urgent messages can be sent to your provider as well.   To learn more about what you can do with MyChart, go to [email or web address].    Your next appointment:   Follow up as needed.  

## 2023-03-11 NOTE — Progress Notes (Signed)
Cardiology Office Note:  .   Date:  03/11/2023  ID:  Brittney Williamson, DOB 1951-03-13, MRN [id number] PCP: Bradley Ferris  [facility] Providers Cardiologist:  Donato Schultz, MD     History of Present Illness: .   Brittney Williamson is a 72 y.o. female Discussed the use of AI scribe software  History of Present Illness   A 72 year old patient with a history of hypertension, hyperlipidemia, amaurosis fugax of the right eye, peripheral arterial disease, and obstructive sleep apnea on CPAP therapy presents for a follow-up visit. The patient was previously referred due to high blood pressure and a suspected mitral valve prolapse, which was later ruled out by a normal echocardiogram in 2021. The patient had stopped taking Crestor and fenofibrate in 2022, opting for dietary supplements instead. A coronary calcium score was ordered and returned a score of zero, indicating a lower risk.  The patient was referred to vascular surgery due to bilateral leg pain and peripheral arterial disease, which was medically managed with Plavix, aspirin, and a statin. The patient reports frequent leg cramps and tingling in the toes at the end of the day. The patient works at a [employer], driving cars.  The patient's hemoglobin A1c is 7.2, indicating high blood sugar levels. The patient reports feeling like she is "falling apart," with increased fatigue and a 20-pound weight gain. The patient also reports bilateral edema.  The patient has been experiencing distress due to weight gain, describing it as a "diabetes belly" and feeling as though she looks pregnant. The patient reports a weight increase from 164 to 188 pounds, with a 5-pound loss in the past two weeks. The patient has been trying to cut out sugar from her diet and has been researching ways to reverse type 2 diabetes.  The patient also reports blockages in her legs, known as peripheral arterial disease, and swelling in her ankles. The  patient has been referred to a vascular doctor for the peripheral arterial disease. The patient also reports bad knees, which limit her ability to walk, and lower back pain.          Risk Assessment/Calculations:   Results LABS Hemoglobin A1c: 7.2  RADIOLOGY Coronary calcium score: 0 (2022)  DIAGNOSTIC REPORTS Echocardiogram: Normal EF, LVH (2021)         Physical Exam:   VS:  BP 138/76   Pulse 98   Ht 5\' 2"  (1.575 m)   Wt 188 lb 9.6 oz (85.5 kg)   SpO2 96%   BMI 34.50 kg/m    Wt Readings from Last 3 Encounters:  03/11/23 188 lb 9.6 oz (85.5 kg)  01/22/23 190 lb 1.6 oz (86.2 kg)  09/24/22 192 lb 6.4 oz (87.3 kg)    GEN: Well nourished, well developed in no acute distress NECK: No JVD; No carotid bruits CARDIAC: RRR, no murmurs, no rubs, no gallops RESPIRATORY:  Clear to auscultation without rales, wheezing or rhonchi  ABDOMEN: Soft, non-tender, non-distended EXTREMITIES:  No edema; No deformity   ASSESSMENT AND PLAN: .    Assessment and Plan    Peripheral Arterial Disease (PAD) PAD with bilateral leg pain, cramps, and tingling in toes. Managed with Pletol, aspirin, and statin. Reports frequent leg cramps and tingling at the end of the day. - Continue current management with primary care provider/vascular   Hypertension Hypertension, previously very high, currently well-managed. Reports increased fatigue and bilateral edema. - Continue current management with primary care provider  Hyperlipidemia Hyperlipidemia,  previously managed with Crestor and fenofibrate, currently not on these medications. Coronary calcium score in 2022 was zero, indicating lower risk. - Continue current management with primary care provider, LDL 59 07/2022 - execellent, Trigs 296 (poor sugar control)  Type 2 Diabetes Mellitus Type 2 diabetes mellitus with hemoglobin A1c of 7.2. Reports high blood glucose levels and dietary challenges. Resistant to medications like metformin and Ozempic,  preferring dietary modifications and lifestyle changes. - Continue dietary modifications and lifestyle changes - Consider referral to endocrinologist if needed - Continue follow-up with primary care provider  Obstructive Sleep Apnea Obstructive sleep apnea, managed with CPAP. - Continue current management with primary care provider  General Health Maintenance Advised to focus on clean, non-processed foods, and avoid sugars. Emphasis on diet for managing diabetes and overall health. Reports significant weight fluctuations and difficulty with physical activity due to knee pain. - Continue dietary modifications - Stay active despite knee pain - Follow-up with primary care provider for ongoing management  Follow-up - PRN follow-up with cardiologist as needed.               Signed, Donato Schultz, MD

## 2023-04-26 DIAGNOSIS — H2513 Age-related nuclear cataract, bilateral: Secondary | ICD-10-CM | POA: Diagnosis not present

## 2023-05-02 DIAGNOSIS — E1136 Type 2 diabetes mellitus with diabetic cataract: Secondary | ICD-10-CM | POA: Diagnosis not present

## 2023-05-02 DIAGNOSIS — I10 Essential (primary) hypertension: Secondary | ICD-10-CM | POA: Diagnosis not present

## 2023-05-02 DIAGNOSIS — E1151 Type 2 diabetes mellitus with diabetic peripheral angiopathy without gangrene: Secondary | ICD-10-CM | POA: Diagnosis not present

## 2023-05-02 DIAGNOSIS — H25811 Combined forms of age-related cataract, right eye: Secondary | ICD-10-CM | POA: Diagnosis not present

## 2023-05-03 DIAGNOSIS — H33311 Horseshoe tear of retina without detachment, right eye: Secondary | ICD-10-CM | POA: Diagnosis not present

## 2023-05-03 DIAGNOSIS — Z9841 Cataract extraction status, right eye: Secondary | ICD-10-CM | POA: Diagnosis not present

## 2023-05-03 DIAGNOSIS — E1136 Type 2 diabetes mellitus with diabetic cataract: Secondary | ICD-10-CM | POA: Diagnosis not present

## 2023-05-03 DIAGNOSIS — H2512 Age-related nuclear cataract, left eye: Secondary | ICD-10-CM | POA: Diagnosis not present

## 2023-05-03 DIAGNOSIS — Z961 Presence of intraocular lens: Secondary | ICD-10-CM | POA: Diagnosis not present

## 2023-05-15 DIAGNOSIS — Z9841 Cataract extraction status, right eye: Secondary | ICD-10-CM | POA: Diagnosis not present

## 2023-05-15 DIAGNOSIS — E119 Type 2 diabetes mellitus without complications: Secondary | ICD-10-CM | POA: Diagnosis not present

## 2023-05-15 DIAGNOSIS — H2512 Age-related nuclear cataract, left eye: Secondary | ICD-10-CM | POA: Diagnosis not present

## 2023-05-15 DIAGNOSIS — Z961 Presence of intraocular lens: Secondary | ICD-10-CM | POA: Diagnosis not present

## 2023-05-15 DIAGNOSIS — H33311 Horseshoe tear of retina without detachment, right eye: Secondary | ICD-10-CM | POA: Diagnosis not present

## 2023-05-15 DIAGNOSIS — E1136 Type 2 diabetes mellitus with diabetic cataract: Secondary | ICD-10-CM | POA: Diagnosis not present

## 2023-05-16 DIAGNOSIS — H25812 Combined forms of age-related cataract, left eye: Secondary | ICD-10-CM | POA: Diagnosis not present

## 2023-05-16 DIAGNOSIS — I10 Essential (primary) hypertension: Secondary | ICD-10-CM | POA: Diagnosis not present

## 2023-05-17 DIAGNOSIS — Z9841 Cataract extraction status, right eye: Secondary | ICD-10-CM | POA: Diagnosis not present

## 2023-05-17 DIAGNOSIS — E119 Type 2 diabetes mellitus without complications: Secondary | ICD-10-CM | POA: Diagnosis not present

## 2023-05-17 DIAGNOSIS — Z961 Presence of intraocular lens: Secondary | ICD-10-CM | POA: Diagnosis not present

## 2023-05-17 DIAGNOSIS — Z9842 Cataract extraction status, left eye: Secondary | ICD-10-CM | POA: Diagnosis not present

## 2023-05-17 DIAGNOSIS — H33311 Horseshoe tear of retina without detachment, right eye: Secondary | ICD-10-CM | POA: Diagnosis not present

## 2023-05-31 DIAGNOSIS — Z9842 Cataract extraction status, left eye: Secondary | ICD-10-CM | POA: Diagnosis not present

## 2023-05-31 DIAGNOSIS — H33311 Horseshoe tear of retina without detachment, right eye: Secondary | ICD-10-CM | POA: Diagnosis not present

## 2023-05-31 DIAGNOSIS — E119 Type 2 diabetes mellitus without complications: Secondary | ICD-10-CM | POA: Diagnosis not present

## 2023-05-31 DIAGNOSIS — Z9841 Cataract extraction status, right eye: Secondary | ICD-10-CM | POA: Diagnosis not present

## 2023-05-31 DIAGNOSIS — Z961 Presence of intraocular lens: Secondary | ICD-10-CM | POA: Diagnosis not present

## 2023-06-12 NOTE — Progress Notes (Signed)
 PATIENT: Brittney Williamson DOB: 1950-06-25  REASON FOR VISIT: follow up HISTORY FROM: patient  Chief Complaint  Patient presents with   Obstructive Sleep Apnea    Rm2, alone, osa on cpap: cpap compliant, gets 5-6 hrs sleep nightly during week and 8 hrs on weekends. Ess score 4     HISTORY OF PRESENT ILLNESS:  06/17/23 ALL:  Brittney Williamson returns for follow up for OSA on CPAP. She continues to do well on therapy. She denies concerns with machine or supplies. She is using therapy nightly for about 5-6 hours but endorses interrupted sleep for various reasons. She sleeps with her 80lb doodle mix. She is having more trouble with bowel and bladder issues. She was recently diagnosed with DM. She is hesitant to take meds. She tried changing diet but CBGs staying around 160-200. She is interested in integrative medicine. Current machine is 72 years old.     06/11/2022 ALL: Brittney Williamson returns for follow up for for OSA on CPAP. She continues to do well on therapy. She is using CPAP nightly for about 7-8 hours. She denies concerns with machine or supplies.     06/05/2021 ALL: Brittney Williamson returns for follow up for OSA on CPAP. Her download below shows excellent compliance for days worn 97%. She does well for wearing her machine greater than 4 hours 73%. Her apnea is well managed with an AHI of 2 on auto pressure setting 5-15 mmHg. Moderate leak in the 95th percentile at 13. 2 L/min. She reports that she is doing ok but is struggling with raising a new puppy and states her sleep patterns are sporadic. She does endorse that her machine and CPAP are comfortable.      06/02/2020 ALL:  She returns for follow up for OSA on CPAP therapy.   Compliance report dated 05/02/2020 through 05/31/2020 reveals that she used CPAP 29 in the past 30 days for compliance of 97%.  She used CPAP greater than 4 hours 19 of the past 30 days for compliance of 63%.  Average usage on days used was 5 hours and 6 minutes.  Residual AHI was 2.3 on  5 to 15 cm of water and an EPR of 1.  There was no significant leak noted.  06/02/2019 ALL:  Brittney Williamson is a 73 y.o. female here today for follow up for OSA on CPAP. She is doing very well with CPAP therapy. She has gotten accustomed to using her machine every night and for at least 4 hours. She recently had left knee surgery and is recovering well. She feels great today and without concerns.   Compliance report dated 05/02/2019 through 05/31/2019 reveals that she has used CPAP 29 of the last 30 days for compliance of 97%.  27 of the last 30 days she used CPAP greater than 4 hours for compliance of 90%.  Average usage was 6 hours and 18 minutes.  Residual AHI was 3.4 on 5 to 15 cm of water and an EPR of 1.  There was no significant leak noted.  01/07/2019 ALL: Brittney Williamson is a 73 y.o. female here today for follow up of OSA on CPAP. She has continued to work on compliance.  She admits that she does not sleep for more than 4 hours at night.  She does take naps during the day.  She denies any concerns with CPAP therapy.   Compliance report dated 10/09/2018 through 01/06/2019 reveals that she is using CPAP 80 out of the last 90  days for compliance of 89%.  57 days she used CPAP greater than 4 hours for compliance of 63%.  Average usage was 4 hours and 46 minutes.  AHI was slightly elevated at 7.1 on 5 to 15 cm of water and an EPR of 3.  There was no significant leak.   10/07/2018 ALL: Brittney Williamson is a 72 y.o. female here today for follow up of OSA on CPAP. She reports that she is continuing to adjust to CPAP therapy.  She is trying to use her machine each night.  Download report dated 09/07/2018 through 10/06/2018 reveals that over the last 30 days she is used her machine 27 out of 30 days for compliance of 90%.  19 days she used her machine greater than 4 hours for compliance of 63%.  Average usage was about 4 hours and 56 minutes.  AHI was 4.2 on 5 to 15 cm of water and an EPR level of 3.  There was  no significant leak.  She does note benefit from CPAP therapy when used regularly.   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 After her diagnosis with sleep apnea out in the split-night titration study from 13 February 2016 she had voiced an interest in the dental device and had actually spoken to Dr. Myrtis Ser, but financial means were not such that she could pursue this treatment her AHI was rather high she had severe apnea with an AHI of 45.7 slightly accentuated in supine and in rem sleep.  Her AHI became 1.3 after she was titrated to 9 cmH2O CPAP and she has therefore pursued for CPAP treatment she endorsed today the Epworth Sleepiness Scale at 6 points the fatigue severity scale of 0 the geriatric depression score at 1 out of 15.   Good compliance to CPAP however was spotty I do have a 3 months compliance data and for the month of November she did very well however in December she barely uses the CPAP every third day and she had not used it through the month of October either.  CPAP is set at 9 cm water pressure with 3 cm EPR and the average user time is 1 hour and 23 minutes.  She does have very minor air leakage and some central apneas arising.  I would like for her to continue on CPAP and she still owns an S9 AutoSet.  The patient stated her sleep study felt not representative for her normal sleep.  Can she repeat a study?  I will arrange for a HST.    UPDATE 01/23/2017.CM Brittney Williamson, 73 year old female returns for follow-up for CPAP compliance. She has severe obstructive sleep apnea and periodic limb movements. CPAP compliance data dated 12/24/2016-01/22/2017 shows greater than 4 hours for 17 days or 57%. Average usage 5 hours 5 minutes. Set pressure 9 cm EPR 3AHI 6.8.ESS  8. FSS 22. She returns for reevaluation.   07/02/16 CM: Brittney Williamson, 72 year old female returns for CPAP compliance after having a split-night titration sleep study on 02/13/2016. Her last compliance report greater than 4 hours 63% for 19 days. Her compliance today is 25 out of 30 days 83% compliance greater than 4 hours compliance 67%.   Her sleep  shows severe obstructive sleep apnea and periodic  limb movements of sleep unknown  clinical significance. She is on  CPAP at 9 cm. She is advised to avoid sedative hypnotics which may worsen her sleep apnea also alcohol and tobacco. She claims  today that  sometimes when she goes to bed she is too tired and forgets to use the appliance. She denies having any illnesses but she could not use.Average usage for days used 5 hours 23 minutes at 9 cm of pressure EPR level 3. AHI for 3.1.  She returns for reevaluation   REVIEW OF SYSTEMS: Out of a complete 14 system review of symptoms, the patient complains only of the following symptoms, muscle cramps, fatigue and all other reviewed systems are negative  ESS: 4/24  ALLERGIES: Allergies  Allergen Reactions   Lactose Intolerance (Gi)    Lovastatin     Myalgia and severe fatigue   Penicillins Itching   Trimox [Amoxicillin Trihydrate] Itching   Amoxicillin Rash    HOME MEDICATIONS: Outpatient Medications Prior to Visit  Medication Sig Dispense Refill   acetaminophen (TYLENOL) 325 MG tablet Take 2 tablets (650 mg total) by mouth every 6 (six) hours as needed.     acyclovir (ZOVIRAX) 400 MG tablet Take 400 mg by mouth as needed.     Ascorbic Acid (VITAMIN C) 500 MG CHEW Chew by mouth daily. 2 gummies     aspirin 81 MG chewable tablet Chew 81 mg by mouth daily.     b complex vitamins capsule Take 1 capsule by mouth daily.     Blood Glucose Monitoring Suppl DEVI 1 each by Does not apply route in the morning, at noon, and at bedtime. May substitute to any manufacturer covered by patient's insurance. 1 each 0    Coenzyme Q10 (COQ-10) 100 MG CAPS Take 1 capsule by mouth daily.     diclofenac Sodium (VOLTAREN ARTHRITIS PAIN) 1 % GEL Apply 4 g topically as needed.     dorzolamide-timolol (COSOPT) 2-0.5 % ophthalmic solution Place 1 drop into the right eye 2 (two) times daily.     fenofibrate (TRICOR) 145 MG tablet Take 1 tablet (145 mg total) by mouth daily. 90 tablet 2   Glucose Blood (BLOOD GLUCOSE TEST STRIPS) STRP For monitoring blood sugar levels up to 4 times a day. May substitute to any manufacturer covered by patient's insurance. 200 strip 99   Lactase (LACTAID PO) Take by mouth as needed.     Lancet Device MISC For monitoring blood sugar up to 4 times a day. May substitute to any manufacturer covered by patient's insurance. 200 each 99   Lancets Misc. MISC For monitoring blood sugar up to 4 times a day. May substitute to any manufacturer covered by patient's insurance. 200 each 99   melatonin 5 MG TABS Take 5 mg by mouth at bedtime as needed.     Menthol-Methyl Salicylate (SALONPAS PAIN RELIEF PATCH) 3-10 % PTCH Apply topically as needed.     naproxen sodium (ALEVE) 220 MG tablet Take 220 mg by mouth 2 (two) times daily as needed.     rosuvastatin (CRESTOR) 10 MG tablet Take 1 tablet (10 mg total) by mouth at bedtime. 90 tablet 2   spironolactone (ALDACTONE) 25 MG tablet Take 1 tablet (25 mg total) by mouth daily. 30 tablet 11   triamcinolone cream (KENALOG) 0.1 % Apply 1 Application topically 2 (two) times daily.     Vitamin D-Vitamin K (VITAMIN K2-VITAMIN D3 PO) Take 1 capsule by mouth daily. Dosage  (d3)/  90  mg (k2)     Zinc 50 MG TABS Take 1 tablet by mouth once a week.     cilostazol (PLETAL) 100 MG tablet Take 1 tablet (100 mg total) by mouth 2 (two) times daily. 60 tablet 11   magnesium sulfate (EPSOM SALT) GRAN Weekly     No facility-administered medications prior to visit.    PAST MEDICAL HISTORY: Past Medical History:  Diagnosis Date   Adopted    05-10-2022  per pt does  not know biological family medical history   Amaurosis fugax of right eye 2021   Atherosclerosis of both lower extremities with intermittent claudication (HCC)    vacular--- dr c. Chestine Spore   Chronically dry eyes, bilateral    Diet-controlled type 2 diabetes mellitus (HCC)    Edema of both lower extremities    due to PAD   H/O mitral valve prolapse 1993   per pt told in 1993 had MVP,  per cardiologist--- dr Anne Fu , no evidence mvp per echo in epic 09-07-2019   History of adenomatous polyp of colon    History of retinal tear    horseshow tear right eye  s/p cryoablation   Hypertension    followed by pcp and cardiologist   (cardiac CT  04-21-2021  calcium score zero   Lactose intolerance    Low back pain    05-10-2022  per pt pain just started   Mixed hyperlipidemia    followed by pcp and cardiologist  (dr Anne Fu)   OA (osteoarthritis)    OSA on CPAP 1993   followed by dr Vickey Huger;   first dx 1993;  restested 02-17-2016 in epic, AHI 45.7/hr   PMB (postmenopausal bleeding)    SUI (stress urinary incontinence, female)    Uterine fibroid     PAST SURGICAL HISTORY: Past Surgical History:  Procedure Laterality Date   ANKLE ARTHROSCOPY Left    1980's;   for scar tissue post facture , no hardware   BREAST EXCISIONAL BIOPSY Left    1990;  1992;  1994  (all benign)   COLONOSCOPY  01/2022   DERMOID CYST  EXCISION     age 89--  per pt laparotomy unilateral fallopian/ ovarian dermoid cystectomy   DILATATION & CURETTAGE/HYSTEROSCOPY WITH MYOSURE N/A 05/16/2022   Procedure: DILATATION & CURETTAGE/HYSTEROSCOPY WITH MYOSURE XL;  Surgeon: Charlett Nose, MD;  Location: [facility];  Service: Gynecology;  Laterality: N/A;   KNEE ARTHROSCOPY Right 2005   KNEE ARTHROSCOPY WITH MEDIAL MENISECTOMY Left 05/27/2019   OPERATIVE ULTRASOUND N/A 05/16/2022   Procedure: OPERATIVE ULTRASOUND;  Surgeon: Charlett Nose, MD;  Location: [facility];  Service: Gynecology;   Laterality: N/A;   ORIF WRIST FRACTURE Right 06/2015    FAMILY HISTORY: Family History  Adopted: Yes    SOCIAL HISTORY: Social History   Socioeconomic History   Marital status: Single    Spouse name: Not on file   Number of children: Not on file   Years of education: Not on file   Highest education level: Not on file  Occupational History   Not on file  Tobacco Use   Smoking status: Never   Smokeless tobacco: Never  Vaping Use   Vaping status: Never Used  Substance and Sexual Activity   Alcohol use: Not Currently    Comment: rare   Drug use: Never   Sexual activity: Not on file  Other Topics Concern   Not on file  Social History Narrative   Not on  file   Social Drivers of Health   Financial Resource Strain: Low Risk  (08/07/2022)   Overall Financial Resource Strain (CARDIA)    Difficulty of Paying Living Expenses: Not hard at all  Food Insecurity: Low Risk  (11/19/2022)   Received from [facility]   Hunger Vital Sign    Worried About Running Out of Food in the Last Year: Never true    Ran Out of Food in the Last Year: Never true  Transportation Needs: Not on file (11/19/2022)  Physical Activity: Insufficiently Active (08/07/2022)   Exercise Vital Sign    Days of Exercise per Week: 2 days    Minutes of Exercise per Session: 60 min  Stress: No Stress Concern Present (08/07/2022)   [employer] of Occupational Health - Occupational Stress Questionnaire    Feeling of Stress : Not at all  Social Connections: Moderately Integrated (08/07/2022)   Social Connection and Isolation Panel [NHANES]    Frequency of Communication with Friends and Family: More than three times a week    Frequency of Social Gatherings with Friends and Family: More than three times a week    Attends Religious Services: More than 4 times per year    Active Member of [employer] or Organizations: Yes    Attends Banker Meetings: More than 4 times per year    Marital Status: Never  married  Intimate Partner Violence: Not At Risk (08/07/2022)   Humiliation, Afraid, Rape, and Kick questionnaire    Fear of Current or Ex-Partner: No    Emotionally Abused: No    Physically Abused: No    Sexually Abused: No      PHYSICAL EXAM  Vitals:   06/17/23 0934  BP: 131/75  Pulse: 97  Weight: 189 lb 8 oz (86 kg)  Height: 5\' 2"  (1.575 m)    Mallampati: 4  neck circumference: 14"  Body mass index is 34.66 kg/m.  Generalized: Well developed, in no acute distress  Cardiology: normal rate and rhythm, no murmur noted Respiratory: Clear to auscultation bilaterally  Neurological examination  Mentation: Alert oriented to time, place, history taking. Follows all commands speech and language fluent Cranial nerve II-XII: Pupils were equal round reactive to light. Extraocular movements were full, visual field were full  Motor: The motor testing reveals 5 over 5 strength of all 4 extremities. Good symmetric motor tone is noted throughout.  Gait and station: Gait is normal.   DIAGNOSTIC DATA (LABS, IMAGING, TESTING) - I reviewed patient records, labs, notes, testing and imaging myself where available.      No data to display           Lab Results  Component Value Date   WBC 6.5 05/16/2022   HGB 15.0 05/16/2022   HCT 44.0 05/16/2022   MCV 88.8 05/16/2022   PLT 270 05/16/2022      Component Value Date/Time   NA 140 05/16/2022 1027   NA 141 02/12/2022 0903   K 4.9 05/16/2022 1027   CL 103 05/16/2022 1027   CO2 23 02/12/2022 0903   GLUCOSE 96 05/16/2022 1027   BUN 23 05/16/2022 1027   BUN 16 02/12/2022 0903   CREATININE 0.80 05/16/2022 1027   CREATININE 0.80 11/18/2015 0811   CALCIUM 9.6 02/12/2022 0903   PROT 7.0 02/12/2022 0903   ALBUMIN 4.5 02/12/2022 0903   AST 25 02/12/2022 0903   ALT 28 02/12/2022 0903   ALKPHOS 66 02/12/2022 0903   BILITOT 0.3 02/12/2022 0903   GFRNONAA  63 05/20/2020 0829   GFRNONAA 78 11/18/2015 0811   GFRAA 73 05/20/2020 0829    GFRAA >89 11/18/2015 0811   Lab Results  Component Value Date   CHOL 139 08/13/2022   HDL 34 (L) 08/13/2022   LDLCALC 59 08/13/2022   LDLDIRECT 78 11/06/2019   TRIG 296 (H) 08/13/2022   CHOLHDL 4.1 08/13/2022   Lab Results  Component Value Date   HGBA1C 7.2 (H) 08/13/2022   No results found for: "VITAMINB12" Lab Results  Component Value Date   TSH 0.803 01/23/2021       ASSESSMENT AND PLAN 73 y.o. year old female  has a past medical history of Adopted, Amaurosis fugax of right eye (2021), Atherosclerosis of both lower extremities with intermittent claudication (HCC), Chronically dry eyes, bilateral, Diet-controlled type 2 diabetes mellitus (HCC), Edema of both lower extremities, H/O mitral valve prolapse (1993), History of adenomatous polyp of colon, History of retinal tear, Hypertension, Lactose intolerance, Low back pain, Mixed hyperlipidemia, OA (osteoarthritis), OSA on CPAP (1993), PMB (postmenopausal bleeding), SUI (stress urinary incontinence, female), and Uterine fibroid. here with     ICD-10-CM   1. OSA on CPAP  G47.33 For home use only DME continuous positive airway pressure (CPAP)    Home sleep test      Aamori is doing very well with CPAP therapy. Compliance report reveals excellent daily compliance. 4 hour compliance is optimal but we have discussed ways to avoid interrupted sleep. She was encouraged to continue using CPAP nightly and for greater than 4 hours each night. She is eligible for a new machine. HST orders placed. She will follow-up with me 31-90 days following set up of new machine.  She verbalizes understanding and agreement with this plan.   Orders Placed This Encounter  Procedures   For home use only DME continuous positive airway pressure (CPAP)    Heated Humidity with all supplies as needed    Length of Need:   Lifetime    Patient has OSA or probable OSA:   Yes    Is the patient currently using CPAP in the home:   Yes    Settings:   Other see  comments    CPAP supplies needed:   Mask, headgear, cushions, filters, heated tubing and water chamber   Home sleep test    Standing Status:   Future    Expiration Date:   06/16/2024    Where should this test be performed::   [facility]     No orders of the defined types were placed in this encounter.     Shawnie Dapper, FNP-C 06/17/2023, 10:07 AM [facility] [address], [address] [place], [place] [address] [phone]

## 2023-06-12 NOTE — Patient Instructions (Addendum)
 Please continue using your CPAP regularly. While your insurance requires that you use CPAP at least 4 hours each night on 70% of the nights, I recommend, that you not skip any nights and use it throughout the night if you can. Getting used to CPAP and staying with the treatment long term does take time and patience and discipline. Untreated obstructive sleep apnea when it is moderate to severe can have an adverse impact on cardiovascular health and raise her risk for heart disease, arrhythmias, hypertension, congestive heart failure, stroke and diabetes. Untreated obstructive sleep apnea causes sleep disruption, nonrestorative sleep, and sleep deprivation. This can have an impact on your day to day functioning and cause daytime sleepiness and impairment of cognitive function, memory loss, mood disturbance, and problems focussing. Using CPAP regularly can improve these symptoms.  We will update supply orders, today. You are eligible for a new machine. I will order a repeat sleep study that you will do at home. Please listen out for a call from our sleep lab staff to schedule. Once you complete study, our sleep doctors with evaluate the data and we will use that to order a new machine as indicated. This process could take a few weeks. Once new machine is ordered, you will hear back from your DME company to schedule set up of your new machine.   We will need to see you back within 31-90 days following set up of your new CPAP to document compliance as required by your insurance company. Please call the office to schedule follow up when you receive your new machine. Please feel free to reach out with any questions or concerns.    Please continue to follow up closely with PCP for new diagnosis of DM. Consider integrative MD if not interested in traditional medicine. [facility] and [facility] are two options to consider.

## 2023-06-14 DIAGNOSIS — K529 Noninfective gastroenteritis and colitis, unspecified: Secondary | ICD-10-CM | POA: Diagnosis not present

## 2023-06-17 ENCOUNTER — Ambulatory Visit (INDEPENDENT_AMBULATORY_CARE_PROVIDER_SITE_OTHER): Payer: Medicare Other | Admitting: Family Medicine

## 2023-06-17 ENCOUNTER — Encounter: Payer: Self-pay | Admitting: Family Medicine

## 2023-06-17 VITALS — BP 131/75 | HR 97 | Ht 62.0 in | Wt 189.5 lb

## 2023-06-17 DIAGNOSIS — G4733 Obstructive sleep apnea (adult) (pediatric): Secondary | ICD-10-CM | POA: Diagnosis not present

## 2023-06-24 DIAGNOSIS — Z01419 Encounter for gynecological examination (general) (routine) without abnormal findings: Secondary | ICD-10-CM | POA: Diagnosis not present

## 2023-07-09 ENCOUNTER — Ambulatory Visit: Admitting: Neurology

## 2023-07-09 DIAGNOSIS — E66811 Obesity, class 1: Secondary | ICD-10-CM

## 2023-07-09 DIAGNOSIS — G4733 Obstructive sleep apnea (adult) (pediatric): Secondary | ICD-10-CM

## 2023-07-09 DIAGNOSIS — G453 Amaurosis fugax: Secondary | ICD-10-CM

## 2023-07-09 DIAGNOSIS — I6521 Occlusion and stenosis of right carotid artery: Secondary | ICD-10-CM

## 2023-07-10 NOTE — Progress Notes (Signed)
 [facility]  Brittney Williamson 73 year old female 12/10/1950   HOME SLEEP TEST REPORT ( by [facility] PAT)   STUDY DATE:  07-09-2023 Data return 07-10-2023   ORDERING CLINICIAN:  Shawnie Dapper, NP REFERRING CLINICIAN:    CLINICAL INFORMATION/HISTORY: 06-17-2023: 06/17/23 ALL:  [name] returns for follow up for OSA on CPAP. She continues to do well on therapy. She denies concerns with machine or supplies. She is using therapy nightly for about 5-6 hours but endorses interrupted sleep for various reasons. She sleeps with her 80lb doodle mix. She is having more trouble with bowel and bladder issues. She was recently diagnosed with DM. She is hesitant to take meds. She tried changing diet but CBGs staying around 160-200. She is interested in integrative medicine. Current machine is 73 years old.  The 2017 AHI was 46/h.      Epworth sleepiness score: 4 /24.   BMI: 34.7  kg/m   Neck Circumference: 14   FINDINGS:   Sleep Summary:   Total Recording Time (hours, min):  7 h 21      Total Sleep Time (hours, min):  6 h 53 m               Percent REM (%):    15.5%      Fairly uninterrupted sleep with one break between 1.20- 1.40 AM                                 Respiratory Indices:   Calculated pAHI (per hour):   CMS based total AHI was 52 /h                           REM pAHI:    43/h                                             NREM pAHI:  54/h                             Positional AHI:     Supine and prone sleep were associated with the higher AHIs ( 71.4/h and 82/h hour)  while sleep on the left side was associated with an AHI of  31/h.                                              Oxygen Saturation Statistics:   Oxygen Saturation (%) Mean:    91 %               O2 Saturation Range (%):     nadir at 63% and highest saturation at 98%.                                   O2 Saturation (minutes) <89%:  71 minutes (!!) , 17% of total sleep time.           Pulse Rate  Statistics:   Pulse Mean (bpm):   68 bpm  Pulse Range:  Between  56 and 91 bpm.      IMPRESSION:  This HST confirms the presence of severe sleep apnea , presumed to be all obstructive and associated with severe hypoxia.    RECOMMENDATION: Continuation of positive airway pressure therapy, currently between 5 and 15 cm water pressure, 1 cm H20 EPR and  a residual AHI of 1.8/h.    PS: it could be important to obtain a FSS and GDS score and look for residual hypoxia by an ONO while on CPAP.     INTERPRETING PHYSICIAN:   Melvyn Novas, MD

## 2023-07-14 NOTE — Procedures (Signed)
 [facility]  Brittney Williamson 73 year old female 03-Apr-1951   HOME SLEEP TEST REPORT ( by Watch PAT)   STUDY DATE:  07-09-2023 Data return 07-10-2023   ORDERING CLINICIAN:  Shawnie Dapper, NP REFERRING CLINICIAN:    CLINICAL INFORMATION/HISTORY: 06-17-2023: 06/17/23 ALL:  [name] returns for follow up for OSA on CPAP. She continues to do well on therapy. She denies concerns with machine or supplies. She is using therapy nightly for about 5-6 hours but endorses interrupted sleep for various reasons. She sleeps with her 80lb doodle mix. She is having more trouble with bowel and bladder issues. She was recently diagnosed with DM. She is hesitant to take meds. She tried changing diet but CBGs staying around 160-200. She is interested in integrative medicine. Current machine is 72 years old.  The 2017 AHI was 46/h.      Epworth sleepiness score: 4 /24.   BMI: 34.7  kg/m   Neck Circumference: 14   FINDINGS:   Sleep Summary:   Total Recording Time (hours, min):  7 h 21      Total Sleep Time (hours, min):  6 h 53 m               Percent REM (%):    15.5%      Fairly uninterrupted sleep with one break between 1.20- 1.40 AM                                 Respiratory Indices:   Calculated pAHI (per hour):   CMS based total AHI was 52 /h                           REM pAHI:    43/h                                             NREM pAHI:  54/h                             Positional AHI:     Supine and prone sleep were associated with the higher AHIs ( 71.4/h and 82/h hour)  while sleep on the left side was associated with an AHI of  31/h.                                              Oxygen Saturation Statistics:   Oxygen Saturation (%) Mean:    91 %               O2 Saturation Range (%):     nadir at 63% and highest saturation at 98%.                                   O2 Saturation (minutes) <89%:  71 minutes (!!) , 17% of total sleep time.           Pulse Rate  Statistics:   Pulse Mean (bpm):   68 bpm  Pulse Range:  Between  56 and 91 bpm.      IMPRESSION:  This HST confirms the presence of severe sleep apnea , presumed to be all obstructive and associated with severe hypoxia.    RECOMMENDATION: Continuation of positive airway pressure therapy, currently between 5 and 15 cm water pressure, 1 cm H20 EPR and  a residual AHI of 1.8/h.    PS: it could be important to obtain a FSS (and if elevated a GDS score) and look for residual hypoxia by an ONO while on CPAP.     INTERPRETING PHYSICIAN:   Melvyn Novas, MD

## 2023-07-15 ENCOUNTER — Encounter: Payer: Self-pay | Admitting: Family Medicine

## 2023-07-15 NOTE — Telephone Encounter (Signed)
 Community message sent to [employer] that order placed.

## 2023-07-24 ENCOUNTER — Telehealth: Payer: Self-pay

## 2023-07-24 NOTE — Telephone Encounter (Addendum)
 Triage:  -pt LM on triage nurse line stating she is having severe cramps, thinks its is blocked again and she is states she needs an other ultrasound -returned call to- no answer -2nd attempt-LM

## 2023-07-29 DIAGNOSIS — Z1231 Encounter for screening mammogram for malignant neoplasm of breast: Secondary | ICD-10-CM | POA: Diagnosis not present

## 2023-07-29 DIAGNOSIS — M810 Age-related osteoporosis without current pathological fracture: Secondary | ICD-10-CM | POA: Diagnosis not present

## 2023-07-29 NOTE — Telephone Encounter (Signed)
 Received notice that pt did not read mychart. I called pt and relayed mychart. She heard back from [employer] who states she is not eligible for new machine until 02/22/24. I checked resmed airview and confirmed set up for current CPAP was 02/20/2019. She will call [employer] mid Oct 2025 to get process started for new cpap. Scheduled CPAP f/u tentatively for 04/06/24 at 9am w/ AL,NP. She is aware she needs f/u 31-90 days from set up date for new machine. She will call back if anything needs to change. Aware I will send FYI to Amy to let her know update.

## 2023-08-13 ENCOUNTER — Encounter (HOSPITAL_BASED_OUTPATIENT_CLINIC_OR_DEPARTMENT_OTHER): Payer: 59

## 2023-08-23 DIAGNOSIS — R252 Cramp and spasm: Secondary | ICD-10-CM | POA: Diagnosis not present

## 2023-08-23 DIAGNOSIS — E782 Mixed hyperlipidemia: Secondary | ICD-10-CM | POA: Diagnosis not present

## 2023-08-23 DIAGNOSIS — E119 Type 2 diabetes mellitus without complications: Secondary | ICD-10-CM | POA: Diagnosis not present

## 2023-08-23 DIAGNOSIS — I739 Peripheral vascular disease, unspecified: Secondary | ICD-10-CM | POA: Diagnosis not present

## 2023-08-26 DIAGNOSIS — E782 Mixed hyperlipidemia: Secondary | ICD-10-CM | POA: Diagnosis not present

## 2023-08-26 DIAGNOSIS — R252 Cramp and spasm: Secondary | ICD-10-CM | POA: Diagnosis not present

## 2023-08-26 DIAGNOSIS — E119 Type 2 diabetes mellitus without complications: Secondary | ICD-10-CM | POA: Diagnosis not present

## 2023-09-06 DIAGNOSIS — I10 Essential (primary) hypertension: Secondary | ICD-10-CM | POA: Diagnosis not present

## 2023-09-06 DIAGNOSIS — Z87891 Personal history of nicotine dependence: Secondary | ICD-10-CM | POA: Diagnosis not present

## 2023-09-06 DIAGNOSIS — Z7982 Long term (current) use of aspirin: Secondary | ICD-10-CM | POA: Diagnosis not present

## 2023-09-06 DIAGNOSIS — E119 Type 2 diabetes mellitus without complications: Secondary | ICD-10-CM | POA: Diagnosis not present

## 2023-09-06 DIAGNOSIS — E782 Mixed hyperlipidemia: Secondary | ICD-10-CM | POA: Diagnosis not present

## 2023-09-06 DIAGNOSIS — Z79899 Other long term (current) drug therapy: Secondary | ICD-10-CM | POA: Diagnosis not present

## 2023-09-09 DIAGNOSIS — L309 Dermatitis, unspecified: Secondary | ICD-10-CM | POA: Diagnosis not present

## 2023-09-09 DIAGNOSIS — D485 Neoplasm of uncertain behavior of skin: Secondary | ICD-10-CM | POA: Diagnosis not present

## 2023-09-09 DIAGNOSIS — C44321 Squamous cell carcinoma of skin of nose: Secondary | ICD-10-CM | POA: Diagnosis not present

## 2023-09-27 ENCOUNTER — Other Ambulatory Visit: Payer: Self-pay | Admitting: Cardiology

## 2023-10-05 ENCOUNTER — Other Ambulatory Visit: Payer: Self-pay | Admitting: Cardiology

## 2023-10-09 DIAGNOSIS — C44321 Squamous cell carcinoma of skin of nose: Secondary | ICD-10-CM | POA: Diagnosis not present

## 2023-10-09 DIAGNOSIS — C441191 Basal cell carcinoma of skin of left upper eyelid, including canthus: Secondary | ICD-10-CM | POA: Diagnosis not present

## 2023-10-09 DIAGNOSIS — D485 Neoplasm of uncertain behavior of skin: Secondary | ICD-10-CM | POA: Diagnosis not present

## 2023-10-09 DIAGNOSIS — C44311 Basal cell carcinoma of skin of nose: Secondary | ICD-10-CM | POA: Diagnosis not present

## 2023-11-08 ENCOUNTER — Other Ambulatory Visit: Payer: Self-pay | Admitting: Cardiology

## 2023-11-08 ENCOUNTER — Encounter (HOSPITAL_COMMUNITY): Payer: Self-pay

## 2023-11-08 ENCOUNTER — Ambulatory Visit (HOSPITAL_COMMUNITY)
Admission: EM | Admit: 2023-11-08 | Discharge: 2023-11-08 | Disposition: A | Discharge status: Home / Self Care | Attending: Emergency Medicine | Admitting: Emergency Medicine

## 2023-11-08 DIAGNOSIS — L853 Xerosis cutis: Secondary | ICD-10-CM

## 2023-11-08 DIAGNOSIS — H04123 Dry eye syndrome of bilateral lacrimal glands: Secondary | ICD-10-CM | POA: Diagnosis not present

## 2023-11-08 DIAGNOSIS — H2512 Age-related nuclear cataract, left eye: Secondary | ICD-10-CM | POA: Diagnosis not present

## 2023-11-08 DIAGNOSIS — H33311 Horseshoe tear of retina without detachment, right eye: Secondary | ICD-10-CM | POA: Diagnosis not present

## 2023-11-08 MED ORDER — PREDNISONE 20 MG PO TABS: 40.0000 mg | ORAL_TABLET | Freq: Every day | ORAL | 0 refills | Status: AC

## 2023-11-08 MED ORDER — ROSUVASTATIN CALCIUM 10 MG PO TABS: 10.0000 mg | ORAL_TABLET | Freq: Every day | ORAL | 1 refills | Status: DC

## 2023-11-08 NOTE — ED Provider Notes (Signed)
 [facility]    CSN: [id number] Arrival date & time: 11/08/23  1102      History   Chief Complaint Chief Complaint  Patient presents with   Rash    HPI Brittney Williamson is a 73 y.o. female.   Patient presents with a rash to her left lower leg that began 2 months ago.  Patient states that she did see a dermatologist and was prescribed triamcinolone  at that time.  Patient states that she has been applying triamcinolone  but she feels like the rash is getting worse and may be spreading.  Patient endorses some itchiness to her lower leg as well as her left upper arm.  Patient states that she thinks she may have been exposed to poison oak from her yard.  Patient states that her dog will run outside and play and then come in and rub up against her legs and she thinks she may have been exposed to poison oak because of this.  Of note patient reports that she does have a history of diabetes and she is not currently taking metformin or any medication for this and states that she is attempting to manage this with diet and exercise.  The history is provided by the patient and medical records.  Rash   Past Medical History:  Diagnosis Date   Adopted    05-10-2022  per pt does not know biological family medical history   Amaurosis fugax of right eye 2021   Atherosclerosis of both lower extremities with intermittent claudication (HCC)    vacular--- dr c. gretta   Chronically dry eyes, bilateral    Diet-controlled type 2 diabetes mellitus (HCC)    Edema of both lower extremities    due to PAD   H/O mitral valve prolapse 1993   per pt told in 1993 had MVP,  per cardiologist--- dr jeffrie , no evidence mvp per echo in epic 09-07-2019   History of adenomatous polyp of colon    History of retinal tear    horseshow tear right eye  s/p cryoablation   Hypertension    followed by pcp and cardiologist   (cardiac CT  04-21-2021  calcium  score zero   Lactose intolerance    Low back pain     05-10-2022  per pt pain just started   Mixed hyperlipidemia    followed by pcp and cardiologist  (dr jeffrie)   OA (osteoarthritis)    OSA on CPAP 1993   followed by dr dohmeier;   first dx 1993;  restested 02-17-2016 in epic, AHI 45.7/hr   PMB (postmenopausal bleeding)    SUI (stress urinary incontinence, female)    Uterine fibroid     Patient Active Problem List   Diagnosis Date Noted   Left foot pain 07/16/2022   Achilles tendinitis 07/16/2022   Diabetes mellitus (HCC) 09/15/2021   Knee osteoarthritis 07/24/2021   Atherosclerosis of native arteries of extremity with intermittent claudication (HCC) 04/18/2021   Carotid artery plaque, right 03/23/2021   Leg pain 03/23/2021   Amaurosis fugax 03/23/2021   Obesity, Class I, BMI 30-34.9 01/23/2021   Phase of life problem 01/23/2021   Osteopenia 11/10/2017   Severe obstructive sleep apnea-hypopnea syndrome 01/12/2016   Insomnia with sleep apnea 01/12/2016   Essential hypertension 11/30/2015   Mixed hyperlipidemia 11/30/2015    Past Surgical History:  Procedure Laterality Date   ANKLE ARTHROSCOPY Left    1980's;   for scar tissue post facture , no hardware   BREAST  EXCISIONAL BIOPSY Left    1990;  1992;  1994  (all benign)   COLONOSCOPY  01/2022   DERMOID CYST  EXCISION     age 26--  per pt laparotomy unilateral fallopian/ ovarian dermoid cystectomy   DILATATION & CURETTAGE/HYSTEROSCOPY WITH MYOSURE N/A 05/16/2022   Procedure: DILATATION & CURETTAGE/HYSTEROSCOPY WITH MYOSURE XL;  Surgeon: Diedre Rosaline BRAVO, MD;  Location: [facility];  Service: Gynecology;  Laterality: N/A;   EYE SURGERY     KNEE ARTHROSCOPY Right 2005   KNEE ARTHROSCOPY WITH MEDIAL MENISECTOMY Left 05/27/2019   OPERATIVE ULTRASOUND N/A 05/16/2022   Procedure: OPERATIVE ULTRASOUND;  Surgeon: Diedre Rosaline BRAVO, MD;  Location: [facility];  Service: Gynecology;  Laterality: N/A;   ORIF WRIST FRACTURE Right 06/2015    OB  History   No obstetric history on file.      Home Medications    Prior to Admission medications   Medication Sig Start Date End Date Taking? Authorizing Provider  predniSONE  (DELTASONE ) 20 MG tablet Take 2 tablets (40 mg total) by mouth daily for 3 days. 11/08/23 11/11/23 Yes Johnie, Rudolfo Brandow A, NP  acetaminophen  (TYLENOL ) 325 MG tablet Take 2 tablets (650 mg total) by mouth every 6 (six) hours as needed. 05/16/22   Diedre Rosaline BRAVO, MD  acyclovir  (ZOVIRAX ) 400 MG tablet Take 400 mg by mouth as needed.    [provider]  Ascorbic Acid (VITAMIN C) 500 MG CHEW Chew by mouth daily. 2 gummies    [provider]  aspirin 81 MG chewable tablet Chew 81 mg by mouth daily.    [provider]  b complex vitamins capsule Take 1 capsule by mouth daily.    [provider]  Blood Glucose Monitoring Suppl DEVI 1 each by Does not apply route in the morning, at noon, and at bedtime. May substitute to any manufacturer covered by patient's insurance. 08/23/22   Towana Small, FNP  Coenzyme Q10 (COQ-10) 100 MG CAPS Take 1 capsule by mouth daily.    [provider]  diclofenac Sodium (VOLTAREN ARTHRITIS PAIN) 1 % GEL Apply 4 g topically as needed.    [provider]  dorzolamide-timolol (COSOPT) 2-0.5 % ophthalmic solution Place 1 drop into the right eye 2 (two) times daily.    [provider]  fenofibrate  (TRICOR ) 145 MG tablet TAKE 1 TABLET BY MOUTH DAILY 11/08/23   Jeffrie Oneil BROCKS, MD  Glucose Blood (BLOOD GLUCOSE TEST STRIPS) STRP For monitoring blood sugar levels up to 4 times a day. May substitute to any manufacturer covered by patient's insurance. 08/23/22   Towana Small, FNP  Lactase (LACTAID PO) Take by mouth as needed.    [provider]  Lancet Device MISC For monitoring blood sugar up to 4 times a day. May substitute to any manufacturer covered by patient's insurance. 08/23/22   Towana Small, FNP  Lancets Misc. MISC For  monitoring blood sugar up to 4 times a day. May substitute to any manufacturer covered by patient's insurance. 08/23/22   Towana Small, FNP  melatonin 5 MG TABS Take 5 mg by mouth at bedtime as needed.    [provider]  Menthol-Methyl Salicylate (SALONPAS PAIN RELIEF PATCH) 3-10 % PTCH Apply topically as needed.    [provider]  naproxen sodium (ALEVE) 220 MG tablet Take 220 mg by mouth 2 (two) times daily as needed.    [provider]  rosuvastatin  (CRESTOR ) 10 MG tablet Take 1 tablet (10 mg total)  by mouth at bedtime. 11/08/23   Jeffrie Oneil BROCKS, MD  spironolactone  (ALDACTONE ) 25 MG tablet Take 1 tablet (25 mg total) by mouth daily. Patient not taking: Reported on 11/08/2023 09/24/22 09/24/23  Swinyer, Rosaline HERO, NP  triamcinolone  cream (KENALOG ) 0.1 % Apply 1 Application topically 2 (two) times daily.    [provider]  Vitamin D-Vitamin K (VITAMIN K2-VITAMIN D3 PO) Take 1 capsule by mouth daily. Dosage  125mcg (d3)/  90 mg (k2)    [provider]  Zinc 50 MG TABS Take 1 tablet by mouth once a week.    [provider]    Family History Family History  Adopted: Yes    Social History Social History   Tobacco Use   Smoking status: Never   Smokeless tobacco: Never  Vaping Use   Vaping status: Never Used  Substance Use Topics   Alcohol use: Yes    Comment: rare   Drug use: Never     Allergies   Lactose intolerance (gi), Lovastatin, Penicillins, Trimox [amoxicillin trihydrate], and Amoxicillin   Review of Systems Review of Systems  Skin:  Positive for rash.   Per HPI  Physical Exam Triage Vital Signs ED Triage Vitals [11/08/23 1148]  Encounter Vitals Group     BP (!) 160/90     Girls Systolic BP Percentile      Girls Diastolic BP Percentile      Boys Systolic BP Percentile      Boys Diastolic BP Percentile      Pulse Rate 74     Resp 16     Temp 97.9 F (36.6 C)     Temp Source Oral     SpO2 95 %     Weight       Height      Head Circumference      Peak Flow      Pain Score 9     Pain Loc      Pain Education      Exclude from Growth Chart    No data found.  Updated Vital Signs BP (!) 160/90 (BP Location: Left Arm)   Pulse 74   Temp 97.9 F (36.6 C) (Oral)   Resp 16   SpO2 95%   Visual Acuity Right Eye Distance:   Left Eye Distance:   Bilateral Distance:    Right Eye Near:   Left Eye Near:    Bilateral Near:     Physical Exam Vitals and nursing note reviewed.  Constitutional:      General: She is awake. She is not in acute distress.    Appearance: Normal appearance. She is well-developed and well-groomed. She is not ill-appearing.  Skin:    General: Skin is warm and dry.     Findings: Erythema and rash present. Rash is macular and papular.     Comments: Mild erythematous maculopapular rash noted to anterior left lower leg.  Dryness also noted to this area and to an area on her right lower leg and left upper arm where patient is currently having itching.  Neurological:     Mental Status: She is alert.  Psychiatric:        Behavior: Behavior is cooperative.      UC Treatments / Results  Labs (all labs ordered are listed, but only abnormal results are displayed) Labs Reviewed - No data to display  EKG   Radiology No results found.  Procedures Procedures (including critical care time)  Medications Ordered in  UC Medications - No data to display  Initial Impression / Assessment and Plan / UC Course  I have reviewed the triage vital signs and the nursing notes.  Pertinent labs & imaging results that were available during my care of the patient were reviewed by me and considered in my medical decision making (see chart for details).     Patient is overall well-appearing.  Vitals are stable.  Exam findings consistent with dry skin dermatitis.  Prescribed short course of prednisone  burst and recommended continue with triamcinolone .  Recommended following up with  dermatology if symptoms continue.  Discussed follow-up and return precautions. Final Clinical Impressions(s) / UC Diagnoses   Final diagnoses:  Dry skin dermatitis     Discharge Instructions      Take 2 tablets of prednisone  once daily for 3 days. Continue applying triamcinolone  cream twice daily to the area. Follow-up with your dermatologist for further evaluation and management of this.     ED Prescriptions     Medication Sig Dispense Auth. Provider   predniSONE  (DELTASONE ) 20 MG tablet Take 2 tablets (40 mg total) by mouth daily for 3 days. 6 tablet Johnie Flaming A, NP      PDMP not reviewed this encounter.   Johnie Flaming A, NP 11/08/23 1234

## 2023-11-08 NOTE — Discharge Instructions (Signed)
 Take 2 tablets of prednisone  once daily for 3 days. Continue applying triamcinolone  cream twice daily to the area. Follow-up with your dermatologist for further evaluation and management of this.

## 2023-11-08 NOTE — ED Triage Notes (Signed)
 Patient reports that she began having poison oak to the left lower leg 2 months ago and saw a physician in [place] and states she was given an ointment, but it is has spread to the right lower leg and both arms

## 2023-11-29 DIAGNOSIS — Z0000 Encounter for general adult medical examination without abnormal findings: Secondary | ICD-10-CM | POA: Diagnosis not present

## 2023-11-29 DIAGNOSIS — I10 Essential (primary) hypertension: Secondary | ICD-10-CM | POA: Diagnosis not present

## 2023-11-29 DIAGNOSIS — E119 Type 2 diabetes mellitus without complications: Secondary | ICD-10-CM | POA: Diagnosis not present

## 2023-11-29 DIAGNOSIS — R21 Rash and other nonspecific skin eruption: Secondary | ICD-10-CM | POA: Diagnosis not present

## 2023-12-10 ENCOUNTER — Other Ambulatory Visit: Payer: Self-pay | Admitting: Neurology

## 2023-12-10 ENCOUNTER — Telehealth: Payer: Self-pay

## 2023-12-10 DIAGNOSIS — G4733 Obstructive sleep apnea (adult) (pediatric): Secondary | ICD-10-CM

## 2023-12-10 DIAGNOSIS — E1165 Type 2 diabetes mellitus with hyperglycemia: Secondary | ICD-10-CM | POA: Diagnosis not present

## 2023-12-10 NOTE — Progress Notes (Signed)
 Machine order placed. And community message sent for the pt.

## 2023-12-10 NOTE — Telephone Encounter (Signed)
Order placed and community message sent.

## 2023-12-10 NOTE — Telephone Encounter (Signed)
 Received a message from rep with [employer]. They need an updated order for CPAP and supplies with current date. When last Rx was submitted, pt did not qualify at that time. Pt does now and is interested in getting the new Rx. Thanks!

## 2023-12-11 DIAGNOSIS — C44319 Basal cell carcinoma of skin of other parts of face: Secondary | ICD-10-CM | POA: Diagnosis not present

## 2024-02-28 ENCOUNTER — Other Ambulatory Visit: Payer: Self-pay

## 2024-02-28 DIAGNOSIS — I70213 Atherosclerosis of native arteries of extremities with intermittent claudication, bilateral legs: Secondary | ICD-10-CM

## 2024-04-02 NOTE — Progress Notes (Signed)
 SABRA

## 2024-04-03 DIAGNOSIS — E119 Type 2 diabetes mellitus without complications: Secondary | ICD-10-CM | POA: Diagnosis not present

## 2024-04-03 NOTE — Progress Notes (Unsigned)
 PATIENT: Brittney Williamson DOB: Jun 05, 1950  REASON FOR VISIT: follow up HISTORY FROM: patient  No chief complaint on file.    HISTORY OF PRESENT ILLNESS:  04/03/2024 ALL:  Brittney Williamson returns for follow up for OSA on CPAP. HST 05/2023 showed severe OSA with AHI of 52, supine AHI 71.4/hr and O2 63%. She received new machine in   She is doing well. She is using therapy for about 7 ours, on average.       06/17/2023 ALL:  Brittney Williamson returns for follow up for OSA on CPAP. She continues to do well on therapy. She denies concerns with machine or supplies. She is using therapy nightly for about 5-6 hours but endorses interrupted sleep for various reasons. She sleeps with her 80lb doodle mix. She is having more trouble with bowel and bladder issues. She was recently diagnosed with DM. She is hesitant to take meds. She tried changing diet but CBGs staying around 160-200. She is interested in integrative medicine. Current machine is 72 years old.     06/11/2022 ALL: Brittney Williamson returns for follow up for for OSA on CPAP. She continues to do well on therapy. She is using CPAP nightly for about 7-8 hours. She denies concerns with machine or supplies.     06/05/2021 ALL: Brittney Williamson returns for follow up for OSA on CPAP. Her download below shows excellent compliance for days worn 97%. She does well for wearing her machine greater than 4 hours 73%. Her apnea is well managed with an AHI of 2 on auto pressure setting 5-15 mmHg. Moderate leak in the 95th percentile at 13. 2 L/min. She reports that she is doing ok but is struggling with raising a new puppy and states her sleep patterns are sporadic. She does endorse that her machine and CPAP are comfortable.      06/02/2020 ALL:  She returns for follow up for OSA on CPAP therapy.   Compliance report dated 05/02/2020 through 05/31/2020 reveals that she used CPAP 29 in the past 30 days for compliance of 97%.  She used CPAP greater than 4 hours 19 of the past 30 days for  compliance of 63%.  Average usage on days used was 5 hours and 6 minutes.  Residual AHI was 2.3 on 5 to 15 cm of water and an EPR of 1.  There was no significant leak noted.  06/02/2019 ALL:  Brittney Williamson is a 73 y.o. female here today for follow up for OSA on CPAP. She is doing very well with CPAP therapy. She has gotten accustomed to using her machine every night and for at least 4 hours. She recently had left knee surgery and is recovering well. She feels great today and without concerns.   Compliance report dated 05/02/2019 through 05/31/2019 reveals that she has used CPAP 29 of the last 30 days for compliance of 97%.  27 of the last 30 days she used CPAP greater than 4 hours for compliance of 90%.  Average usage was 6 hours and 18 minutes.  Residual AHI was 3.4 on 5 to 15 cm of water and an EPR of 1.  There was no significant leak noted.  01/07/2019 ALL: Brittney Williamson is a 73 y.o. female here today for follow up of OSA on CPAP. She has continued to work on compliance.  She admits that she does not sleep for more than 4 hours at night.  She does take naps during the day.  She denies any concerns with  CPAP therapy.   Compliance report dated 10/09/2018 through 01/06/2019 reveals that she is using CPAP 80 out of the last 90 days for compliance of 89%.  57 days she used CPAP greater than 4 hours for compliance of 63%.  Average usage was 4 hours and 46 minutes.  AHI was slightly elevated at 7.1 on 5 to 15 cm of water and an EPR of 3.  There was no significant leak.   10/07/2018 ALL: Brittney Williamson is a 73 y.o. female here today for follow up of OSA on CPAP. She reports that she is continuing to adjust to CPAP therapy.  She is trying to use her machine each night.  Download report dated 09/07/2018 through 10/06/2018 reveals that over the last 30 days she is used her machine 27 out of 30 days for compliance of 90%.  19 days she used her machine greater than 4 hours for compliance of 63%.  Average usage was  about 4 hours and 56 minutes.  AHI was 4.2 on 5 to 15 cm of water and an EPR level of 3.  There was no significant leak.  She does note benefit from CPAP therapy when used regularly.   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 After her diagnosis with sleep apnea out in the split-night titration study from 13 February 2016 she had voiced an interest in the dental device and had actually spoken to Dr. Micky, but financial means were not such that she could pursue this treatment her AHI was rather high she had severe apnea with an AHI of 45.7 slightly accentuated in supine and in rem sleep.  Her AHI became 1.3 after she was titrated to 9 cmH2O CPAP and she has therefore pursued for CPAP treatment she endorsed today the Epworth Sleepiness Scale at 6 points the fatigue severity scale of 0 the geriatric depression score at 1 out of 15.   Good compliance to CPAP however was spotty I do have a 3 months compliance data and for the month of November she did very well however in December she barely uses the CPAP every third day and she had not used it through the month of October either.  CPAP is set at 9 cm water pressure with 3 cm EPR and the average user time is 1 hour and 23 minutes.  She does have very minor air leakage and some central apneas arising.  I would like for her to continue on CPAP and she still owns an S9 AutoSet.  The patient stated her sleep study felt not representative for her normal sleep.  Can she repeat a study?  I will arrange for a HST.    UPDATE 01/23/2017.CM Brittney Williamson, 73 year old female returns for follow-up for CPAP compliance. She has severe obstructive sleep apnea and periodic limb movements. CPAP compliance data dated 12/24/2016-01/22/2017 shows greater  than 4 hours for 17 days or 57%. Average usage 5 hours 5 minutes. Set pressure 9 cm EPR 3AHI 6.8.ESS 8. FSS 22. She returns for reevaluation.   07/02/16 CM: Brittney Williamson, 73 year old female returns for CPAP compliance after having a split-night titration sleep study on 02/13/2016. Her last compliance report greater than 4 hours 63% for 19 days. Her compliance today is 25 out of  30 days 83% compliance greater than 4 hours compliance 67%.   Her sleep  shows severe obstructive sleep apnea and periodic limb movements of sleep unknown  clinical significance. She is on  CPAP at 9 cm. She is advised to avoid sedative hypnotics which may worsen her sleep apnea also alcohol and tobacco. She claims  today that  sometimes when she goes to bed she is too tired and forgets to use the appliance. She denies having any illnesses but she could not use.Average usage for days used 5 hours 23 minutes at 9 cm of pressure EPR level 3. AHI for 3.1.  She returns for reevaluation   REVIEW OF SYSTEMS: Out of a complete 14 system review of symptoms, the patient complains only of the following symptoms, muscle cramps, fatigue and all other reviewed systems are negative  ESS: 4/24  ALLERGIES: Allergies  Allergen Reactions   Lactose Intolerance (Gi)    Lovastatin     Myalgia and severe fatigue   Penicillins Itching   Trimox [Amoxicillin Trihydrate] Itching   Amoxicillin Rash    HOME MEDICATIONS: Outpatient Medications Prior to Visit  Medication Sig Dispense Refill   acetaminophen  (TYLENOL ) 325 MG tablet Take 2 tablets (650 mg total) by mouth every 6 (six) hours as needed.     acyclovir  (ZOVIRAX ) 400 MG tablet Take 400 mg by mouth as needed.     Ascorbic Acid (VITAMIN C) 500 MG CHEW Chew by mouth daily. 2 gummies     aspirin 81 MG chewable tablet Chew 81 mg by mouth daily.     b complex vitamins capsule Take 1 capsule by mouth daily.     Blood Glucose Monitoring Suppl DEVI 1 each by Does not apply route in the morning,  at noon, and at bedtime. May substitute to any manufacturer covered by patient's insurance. 1 each 0   Coenzyme Q10 (COQ-10) 100 MG CAPS Take 1 capsule by mouth daily.     diclofenac Sodium (VOLTAREN ARTHRITIS PAIN) 1 % GEL Apply 4 g topically as needed.     dorzolamide-timolol (COSOPT) 2-0.5 % ophthalmic solution Place 1 drop into the right eye 2 (two) times daily.     fenofibrate  (TRICOR ) 145 MG tablet TAKE 1 TABLET BY MOUTH DAILY 90 tablet 1   Glucose Blood (BLOOD GLUCOSE TEST STRIPS) STRP For monitoring blood sugar levels up to 4 times a day. May substitute to any manufacturer covered by patient's insurance. 200 strip 99   Lactase (LACTAID PO) Take by mouth as needed.     Lancet Device MISC For monitoring blood sugar up to 4 times a day. May substitute to any manufacturer covered by patient's insurance. 200 each 99   Lancets Misc. MISC For monitoring blood sugar up to 4 times a day. May substitute to any manufacturer covered by patient's insurance. 200 each 99   melatonin 5 MG TABS Take 5 mg by mouth at bedtime as needed.     Menthol-Methyl Salicylate (SALONPAS PAIN RELIEF PATCH) 3-10 % PTCH Apply topically as needed.     naproxen sodium (ALEVE) 220 MG tablet Take 220 mg by mouth 2 (two) times daily as needed.     rosuvastatin  (CRESTOR ) 10 MG tablet Take 1 tablet (10 mg total) by mouth at bedtime. 90 tablet 1   spironolactone  (ALDACTONE ) 25 MG tablet Take 1 tablet (25 mg total) by mouth daily. (Patient not taking: Reported on 11/08/2023) 30 tablet 11   triamcinolone  cream (KENALOG ) 0.1 % Apply 1 Application topically 2 (two)  times daily.     Vitamin D-Vitamin K (VITAMIN K2-VITAMIN D3 PO) Take 1 capsule by mouth daily. Dosage  125mcg (d3)/  90 mg (k2)     Zinc 50 MG TABS Take 1 tablet by mouth once a week.     No facility-administered medications prior to visit.    PAST MEDICAL HISTORY: Past Medical History:  Diagnosis Date   Adopted    05-10-2022  per pt does not know biological family  medical history   Amaurosis fugax of right eye 2021   Atherosclerosis of both lower extremities with intermittent claudication    vacular--- dr c. gretta   Chronically dry eyes, bilateral    Diet-controlled type 2 diabetes mellitus (HCC)    Edema of both lower extremities    due to PAD   H/O mitral valve prolapse 1993   per pt told in 1993 had MVP,  per cardiologist--- dr jeffrie , no evidence mvp per echo in epic 09-07-2019   History of adenomatous polyp of colon    History of retinal tear    horseshow tear right eye  s/p cryoablation   Hypertension    followed by pcp and cardiologist   (cardiac CT  04-21-2021  calcium  score zero   Lactose intolerance    Low back pain    05-10-2022  per pt pain just started   Mixed hyperlipidemia    followed by pcp and cardiologist  (dr jeffrie)   OA (osteoarthritis)    OSA on CPAP 1993   followed by dr chalice;   first dx 1993;  restested 02-17-2016 in epic, AHI 45.7/hr   PMB (postmenopausal bleeding)    SUI (stress urinary incontinence, female)    Uterine fibroid     PAST SURGICAL HISTORY: Past Surgical History:  Procedure Laterality Date   ANKLE ARTHROSCOPY Left    1980's;   for scar tissue post facture , no hardware   BREAST EXCISIONAL BIOPSY Left    1990;  1992;  1994  (all benign)   COLONOSCOPY  01/2022   DERMOID CYST  EXCISION     age 74--  per pt laparotomy unilateral fallopian/ ovarian dermoid cystectomy   DILATATION & CURETTAGE/HYSTEROSCOPY WITH MYOSURE N/A 05/16/2022   Procedure: DILATATION & CURETTAGE/HYSTEROSCOPY WITH MYOSURE XL;  Surgeon: Diedre Rosaline BRAVO, MD;  Location: [facility];  Service: Gynecology;  Laterality: N/A;   EYE SURGERY     KNEE ARTHROSCOPY Right 2005   KNEE ARTHROSCOPY WITH MEDIAL MENISECTOMY Left 05/27/2019   OPERATIVE ULTRASOUND N/A 05/16/2022   Procedure: OPERATIVE ULTRASOUND;  Surgeon: Diedre Rosaline BRAVO, MD;  Location: [facility];  Service: Gynecology;  Laterality:  N/A;   ORIF WRIST FRACTURE Right 06/2015    FAMILY HISTORY: Family History  Adopted: Yes    SOCIAL HISTORY: Social History   Socioeconomic History   Marital status: Single    Spouse name: Not on file   Number of children: Not on file   Years of education: Not on file   Highest education level: Not on file  Occupational History   Not on file  Tobacco Use   Smoking status: Never   Smokeless tobacco: Never  Vaping Use   Vaping status: Never Used  Substance and Sexual Activity   Alcohol use: Yes    Comment: rare   Drug use: Never   Sexual activity: Not on file  Other Topics Concern   Not on file  Social History Narrative   Not on file  Social Drivers of Health   Tobacco Use: Low Risk (12/11/2023)   Received from [facility]   Patient History    Smoking Tobacco Use: Never    Smokeless Tobacco Use: Never    Passive Exposure: Not on file  Recent Concern: Tobacco Use - Medium Risk (11/08/2023)   Received from [facility]   Patient History    Smoking Tobacco Use: Former    Smokeless Tobacco Use: Never    Passive Exposure: Not on file  Financial Resource Strain: Low Risk (08/07/2022)   Overall Financial Resource Strain (CARDIA)    Difficulty of Paying Living Expenses: Not hard at all  Food Insecurity: Low Risk (09/06/2023)   Received from [facility]   Epic    Within the past 12 months, you worried that your food would run out before you got money to buy more: Never true    Within the past 12 months, the food you bought just didn't last and you didn't have money to get more. : Never true  Transportation Needs: No Transportation Needs (09/06/2023)   Received from [facility]    In the past 12 months, has lack of reliable transportation kept you from medical appointments, meetings, work or from getting things needed for daily living? : No  Physical Activity: Insufficiently Active (08/07/2022)   Exercise Vital Sign    Days of Exercise per Week: 2  days    Minutes of Exercise per Session: 60 min  Stress: No Stress Concern Present (08/07/2022)   [employer] of Occupational Health - Occupational Stress Questionnaire    Feeling of Stress : Not at all  Social Connections: Moderately Integrated (08/07/2022)   Social Connection and Isolation Panel    Frequency of Communication with Friends and Family: More than three times a week    Frequency of Social Gatherings with Friends and Family: More than three times a week    Attends Religious Services: More than 4 times per year    Active Member of [employer] or Organizations: Yes    Attends Banker Meetings: More than 4 times per year    Marital Status: Never married  Intimate Partner Violence: Not At Risk (08/07/2022)   Humiliation, Afraid, Rape, and Kick questionnaire    Fear of Current or Ex-Partner: No    Emotionally Abused: No    Physically Abused: No    Sexually Abused: No  Depression (PHQ2-9): Low Risk (08/07/2022)   Depression (PHQ2-9)    PHQ-2 Score: 0  Alcohol Screen: Low Risk (08/07/2022)   Alcohol Screen    Last Alcohol Screening Score (AUDIT): 1  Housing: Low Risk (09/06/2023)   Received from [facility]   Epic    What is your living situation today?: I have a steady place to live    Think about the place you live. Do you have problems with any of the following? Choose all that apply:: None/None on this list  Utilities: Low Risk (09/06/2023)   Received from [facility]   Utilities    In the past 12 months has the electric, gas, oil, or water company threatened to shut off services in your home? : No  Health Literacy: Not on file      PHYSICAL EXAM  There were no vitals filed for this visit.   Mallampati: 4  neck circumference: 14  There is no height or weight on file to calculate BMI.  Generalized: Well developed, in no acute distress  Cardiology: normal rate and  rhythm, no murmur noted Respiratory: Clear to auscultation bilaterally   Neurological examination  Mentation: Alert oriented to time, place, history taking. Follows all commands speech and language fluent Cranial nerve II-XII: Pupils were equal round reactive to light. Extraocular movements were full, visual field were full  Motor: The motor testing reveals 5 over 5 strength of all 4 extremities. Good symmetric motor tone is noted throughout.  Gait and station: Gait is normal.   DIAGNOSTIC DATA (LABS, IMAGING, TESTING) - I reviewed patient records, labs, notes, testing and imaging myself where available.      No data to display           Lab Results  Component Value Date   WBC 6.5 05/16/2022   HGB 15.0 05/16/2022   HCT 44.0 05/16/2022   MCV 88.8 05/16/2022   PLT 270 05/16/2022      Component Value Date/Time   NA 140 05/16/2022 1027   NA 141 02/12/2022 0903   K 4.9 05/16/2022 1027   CL 103 05/16/2022 1027   CO2 23 02/12/2022 0903   GLUCOSE 96 05/16/2022 1027   BUN 23 05/16/2022 1027   BUN 16 02/12/2022 0903   CREATININE 0.80 05/16/2022 1027   CREATININE 0.80 11/18/2015 0811   CALCIUM  9.6 02/12/2022 0903   PROT 7.0 02/12/2022 0903   ALBUMIN 4.5 02/12/2022 0903   AST 25 02/12/2022 0903   ALT 28 02/12/2022 0903   ALKPHOS 66 02/12/2022 0903   BILITOT 0.3 02/12/2022 0903   GFRNONAA 63 05/20/2020 0829   GFRNONAA 78 11/18/2015 0811   GFRAA 73 05/20/2020 0829   GFRAA >89 11/18/2015 0811   Lab Results  Component Value Date   CHOL 139 08/13/2022   HDL 34 (L) 08/13/2022   LDLCALC 59 08/13/2022   LDLDIRECT 78 11/06/2019   TRIG 296 (H) 08/13/2022   CHOLHDL 4.1 08/13/2022   Lab Results  Component Value Date   HGBA1C 7.2 (H) 08/13/2022   No results found for: VITAMINB12 Lab Results  Component Value Date   TSH 0.803 01/23/2021       ASSESSMENT AND PLAN 73 y.o. year old female  has a past medical history of Adopted, Amaurosis fugax of right eye (2021), Atherosclerosis of both lower extremities with intermittent claudication,  Chronically dry eyes, bilateral, Diet-controlled type 2 diabetes mellitus (HCC), Edema of both lower extremities, H/O mitral valve prolapse (1993), History of adenomatous polyp of colon, History of retinal tear, Hypertension, Lactose intolerance, Low back pain, Mixed hyperlipidemia, OA (osteoarthritis), OSA on CPAP (1993), PMB (postmenopausal bleeding), SUI (stress urinary incontinence, female), and Uterine fibroid. here with   No diagnosis found.   Wanza is doing very well with CPAP therapy. Compliance report reveals excellent daily compliance. 4 hour compliance is optimal but we have discussed ways to avoid interrupted sleep. She was encouraged to continue using CPAP nightly and for greater than 4 hours each night. She will follow-up with me in 1 year. She verbalizes understanding and agreement with this plan.   No orders of the defined types were placed in this encounter.    No orders of the defined types were placed in this encounter.     Greig Forbes, FNP-C 04/03/2024, 11:12 AM [facility] [address] [place], [place] [address] [phone]

## 2024-04-03 NOTE — Patient Instructions (Signed)
 Please continue using your CPAP regularly. While your insurance requires that you use CPAP at least 4 hours each night on 70% of the nights, I recommend, that you not skip any nights and use it throughout the night if you can. Getting used to CPAP and staying with the treatment long term does take time and patience and discipline. Untreated obstructive sleep apnea when it is moderate to severe can have an adverse impact on cardiovascular health and raise her risk for heart disease, arrhythmias, hypertension, congestive heart failure, stroke and diabetes. Untreated obstructive sleep apnea causes sleep disruption, nonrestorative sleep, and sleep deprivation. This can have an impact on your day to day functioning and cause daytime sleepiness and impairment of cognitive function, memory loss, mood disturbance, and problems focussing. Using CPAP regularly can improve these symptoms.  We will update supply orders, today. We will order an overnight oxygen study to make sure your oxygen level is normal on CPAP.   Follow up in 1 year

## 2024-04-06 ENCOUNTER — Encounter: Payer: Self-pay | Admitting: Family Medicine

## 2024-04-06 ENCOUNTER — Telehealth: Payer: Self-pay

## 2024-04-06 ENCOUNTER — Ambulatory Visit: Admitting: Family Medicine

## 2024-04-06 VITALS — BP 138/82 | HR 75 | Resp 16 | Ht 62.0 in

## 2024-04-06 DIAGNOSIS — R0902 Hypoxemia: Secondary | ICD-10-CM | POA: Diagnosis not present

## 2024-04-06 DIAGNOSIS — G4733 Obstructive sleep apnea (adult) (pediatric): Secondary | ICD-10-CM | POA: Diagnosis not present

## 2024-04-07 ENCOUNTER — Ambulatory Visit: Admitting: Vascular Surgery

## 2024-04-07 ENCOUNTER — Encounter: Payer: Self-pay | Admitting: Vascular Surgery

## 2024-04-07 ENCOUNTER — Inpatient Hospital Stay (HOSPITAL_COMMUNITY): Admission: RE | Admit: 2024-04-07 | Discharge: 2024-04-07 | Attending: Vascular Surgery

## 2024-04-07 VITALS — BP 171/94 | HR 72 | Temp 98.3°F | Resp 20 | Ht 62.0 in | Wt 177.9 lb

## 2024-04-07 DIAGNOSIS — I70213 Atherosclerosis of native arteries of extremities with intermittent claudication, bilateral legs: Secondary | ICD-10-CM | POA: Insufficient documentation

## 2024-04-07 LAB — VAS US ABI WITH/WO TBI
Left ABI: 1
Right ABI: 0.72

## 2024-04-07 NOTE — Progress Notes (Signed)
 Patient name: ERAN WINDISH MRN: [id number] DOB: 1951-01-02 Sex: female  REASON FOR CONSULT: 1 year follow-up right lower extremity claudication  HPI: BELLAMARIE PFLUG is a 73 y.o. female, with history of hypertension, DM, and intermittent lower extremity claudication that presents for 1 year follow-up of lower extremity claudication.  She previously described burning and a charley horse in the right calf when she walks.  We recommended conservative therapy at the initial visit.    On follow-up today no worsening symptoms.  States she is able to get through all of her activities including working it hurts.  Has stopped the Pletal  as it causes diarrhea.  Cannot take statins.   Past Medical History:  Diagnosis Date   Adopted    05-10-2022  per pt does not know biological family medical history   Amaurosis fugax of right eye 2021   Atherosclerosis of both lower extremities with intermittent claudication    vacular--- dr c. gretta   Chronically dry eyes, bilateral    Diet-controlled type 2 diabetes mellitus (HCC)    Edema of both lower extremities    due to PAD   H/O mitral valve prolapse 1993   per pt told in 1993 had MVP,  per cardiologist--- dr jeffrie , no evidence mvp per echo in epic 09-07-2019   History of adenomatous polyp of colon    History of retinal tear    horseshow tear right eye  s/p cryoablation   Hypertension    followed by pcp and cardiologist   (cardiac CT  04-21-2021  calcium  score zero   Lactose intolerance    Low back pain    05-10-2022  per pt pain just started   Mixed hyperlipidemia    followed by pcp and cardiologist  (dr jeffrie)   OA (osteoarthritis)    OSA on CPAP 1993   followed by dr chalice;   first dx 1993;  restested 02-17-2016 in epic, AHI 45.7/hr   PMB (postmenopausal bleeding)    SUI (stress urinary incontinence, female)    Uterine fibroid     Past Surgical History:  Procedure Laterality Date   ANKLE ARTHROSCOPY Left    1980's;   for  scar tissue post facture , no hardware   BREAST EXCISIONAL BIOPSY Left    1990;  1992;  1994  (all benign)   COLONOSCOPY  01/2022   DERMOID CYST  EXCISION     age 73--  per pt laparotomy unilateral fallopian/ ovarian dermoid cystectomy   DILATATION & CURETTAGE/HYSTEROSCOPY WITH MYOSURE N/A 05/16/2022   Procedure: DILATATION & CURETTAGE/HYSTEROSCOPY WITH MYOSURE XL;  Surgeon: Diedre Rosaline BRAVO, MD;  Location: [facility];  Service: Gynecology;  Laterality: N/A;   EYE SURGERY     KNEE ARTHROSCOPY Right 2005   KNEE ARTHROSCOPY WITH MEDIAL MENISECTOMY Left 05/27/2019   OPERATIVE ULTRASOUND N/A 05/16/2022   Procedure: OPERATIVE ULTRASOUND;  Surgeon: Diedre Rosaline BRAVO, MD;  Location: [facility];  Service: Gynecology;  Laterality: N/A;   ORIF WRIST FRACTURE Right 06/2015    Family History  Adopted: Yes    SOCIAL HISTORY: Social History   Socioeconomic History   Marital status: Single    Spouse name: Not on file   Number of children: Not on file   Years of education: Not on file   Highest education level: Not on file  Occupational History   Not on file  Tobacco Use   Smoking status: Never   Smokeless tobacco: Never  Vaping  Use   Vaping status: Never Used  Substance and Sexual Activity   Alcohol use: Yes    Comment: rare   Drug use: Never   Sexual activity: Not on file  Other Topics Concern   Not on file  Social History Narrative   Not on file   Social Drivers of Health   Tobacco Use: Low Risk (04/06/2024)   Patient History    Smoking Tobacco Use: Never    Smokeless Tobacco Use: Never    Passive Exposure: Not on file  Financial Resource Strain: Low Risk (08/07/2022)   Overall Financial Resource Strain (CARDIA)    Difficulty of Paying Living Expenses: Not hard at all  Food Insecurity: Low Risk (09/06/2023)   Received from [facility]   Epic    Within the past 12 months, you worried that your food would run out before you got money  to buy more: Never true    Within the past 12 months, the food you bought just didn't last and you didn't have money to get more. : Never true  Transportation Needs: No Transportation Needs (09/06/2023)   Received from [facility]    In the past 12 months, has lack of reliable transportation kept you from medical appointments, meetings, work or from getting things needed for daily living? : No  Physical Activity: Insufficiently Active (08/07/2022)   Exercise Vital Sign    Days of Exercise per Week: 2 days    Minutes of Exercise per Session: 60 min  Stress: No Stress Concern Present (08/07/2022)   [employer] of Occupational Health - Occupational Stress Questionnaire    Feeling of Stress : Not at all  Social Connections: Moderately Integrated (08/07/2022)   Social Connection and Isolation Panel    Frequency of Communication with Friends and Family: More than three times a week    Frequency of Social Gatherings with Friends and Family: More than three times a week    Attends Religious Services: More than 4 times per year    Active Member of [employer] or Organizations: Yes    Attends Banker Meetings: More than 4 times per year    Marital Status: Never married  Intimate Partner Violence: Not At Risk (08/07/2022)   Humiliation, Afraid, Rape, and Kick questionnaire    Fear of Current or Ex-Partner: No    Emotionally Abused: No    Physically Abused: No    Sexually Abused: No  Depression (PHQ2-9): Low Risk (08/07/2022)   Depression (PHQ2-9)    PHQ-2 Score: 0  Alcohol Screen: Low Risk (08/07/2022)   Alcohol Screen    Last Alcohol Screening Score (AUDIT): 1  Housing: Low Risk (09/06/2023)   Received from [facility]   Epic    What is your living situation today?: I have a steady place to live    Think about the place you live. Do you have problems with any of the following? Choose all that apply:: None/None on this list  Utilities: Low Risk (09/06/2023)    Received from [facility]   Utilities    In the past 12 months has the electric, gas, oil, or water company threatened to shut off services in your home? : No  Health Literacy: Not on file    Allergies  Allergen Reactions   Lactose Intolerance (Gi)    Lovastatin     Myalgia and severe fatigue   Penicillins Itching   Triamcinolone -Moxifloxacin     Other Reaction(s): itching palms chloe  Trimox [Amoxicillin Trihydrate] Itching   Amoxicillin Rash and Dermatitis    Current Outpatient Medications  Medication Sig Dispense Refill   acetaminophen  (TYLENOL ) 325 MG tablet Take 2 tablets (650 mg total) by mouth every 6 (six) hours as needed.     acyclovir  (ZOVIRAX ) 400 MG tablet Take 400 mg by mouth as needed.     Ascorbic Acid (VITAMIN C) 500 MG CHEW Chew by mouth daily. 2 gummies     Blood Glucose Monitoring Suppl DEVI 1 each by Does not apply route in the morning, at noon, and at bedtime. May substitute to any manufacturer covered by patient's insurance. 1 each 0   Coenzyme Q10 (COQ-10) 100 MG CAPS Take 1 capsule by mouth daily.     diclofenac Sodium (VOLTAREN ARTHRITIS PAIN) 1 % GEL Apply 4 g topically as needed.     dorzolamide-timolol (COSOPT) 2-0.5 % ophthalmic solution Place 1 drop into the right eye 2 (two) times daily.     fenofibrate  (TRICOR ) 145 MG tablet TAKE 1 TABLET BY MOUTH DAILY 90 tablet 1   Glucose Blood (BLOOD GLUCOSE TEST STRIPS) STRP For monitoring blood sugar levels up to 4 times a day. May substitute to any manufacturer covered by patient's insurance. 200 strip 99   Lactase (LACTAID PO) Take by mouth as needed.     Lancet Device MISC For monitoring blood sugar up to 4 times a day. May substitute to any manufacturer covered by patient's insurance. 200 each 99   Lancets Misc. MISC For monitoring blood sugar up to 4 times a day. May substitute to any manufacturer covered by patient's insurance. 200 each 99   melatonin 5 MG TABS Take 5 mg by mouth at bedtime as needed.      Menthol-Methyl Salicylate (SALONPAS PAIN RELIEF PATCH) 3-10 % PTCH Apply topically as needed.     naproxen sodium (ALEVE) 220 MG tablet Take 220 mg by mouth 2 (two) times daily as needed.     spironolactone  (ALDACTONE ) 25 MG tablet Take 1 tablet (25 mg total) by mouth daily. 30 tablet 11   triamcinolone  cream (KENALOG ) 0.1 % Apply 1 Application topically 2 (two) times daily.     Vitamin D-Vitamin K (VITAMIN K2-VITAMIN D3 PO) Take 1 capsule by mouth daily. Dosage  125mcg (d3)/  90 mg (k2)     Zinc 50 MG TABS Take 1 tablet by mouth once a week.     No current facility-administered medications for this visit.    REVIEW OF SYSTEMS:  [X]  denotes positive finding, [ ]  denotes negative finding Cardiac  Comments:  Chest pain or chest pressure:    Shortness of breath upon exertion:    Short of breath when lying flat:    Irregular heart rhythm:        Vascular    Pain in calf, thigh, or hip brought on by ambulation: x Right, intermittent  Pain in feet at night that wakes you up from your sleep:     Blood clot in your veins:    Leg swelling:         Pulmonary    Oxygen at home:    Productive cough:     Wheezing:         Neurologic    Sudden weakness in arms or legs:     Sudden numbness in arms or legs:     Sudden onset of difficulty speaking or slurred speech:    Temporary loss of vision in one eye:  Problems with dizziness:         Gastrointestinal    Blood in stool:     Vomited blood:         Genitourinary    Burning when urinating:     Blood in urine:        Psychiatric    Major depression:         Hematologic    Bleeding problems:    Problems with blood clotting too easily:        Skin    Rashes or ulcers:        Constitutional    Fever or chills:      PHYSICAL EXAM: There were no vitals filed for this visit.   GENERAL: The patient is a well-nourished female, in no acute distress. The vital signs are documented above. CARDIAC: There is a regular rate and  rhythm.  VASCULAR:  Palpable femoral pulses bilaterally No palpable pedal pulses No lower extremity tissue loss PULMONARY: No respiratory distress ABDOMEN: Soft and non-tender. MUSCULOSKELETAL: There are no major deformities or cyanosis. NEUROLOGIC: No focal weakness or paresthesias are detected. PSYCHIATRIC: The patient has a normal affect.  DATA:   ABI's today 0.72 right multiphasic and 1.0 left multiphasic (previously 0.67 right monophasic and 0.87 left biphasic)  Lower extremity arterial duplex 04/05/2021 shows occlusion of the distal right popliteal artery and TP trunk.  Left lower extremity has a total occlusion of the anterior tibial and segmental occlusion of the posterior tibial.   Assessment/Plan:  73 year old female presents for 1 year follow-up of her right lower extremity claudication.  Her symptoms remain stable over the last year and she is able to get through all of her activities of daily living including working at [employer].  Discussed we reserve intervention for claudication whenever this becomes lifestyle-limiting.  Otherwise we treat this with medical therapy.  Advised that she continue aspirin and cannot tolerate statins.  Continue walking therapies.  The Pletal  caused diarrhea.  I will have her follow-up in one year.  No indication for intervention at this time with stable symptoms.   Lonni DOROTHA Gaskins, MD [facility] Office: [phone]

## 2024-04-30 NOTE — Telephone Encounter (Addendum)
 Patient said have not received a call to schedule oxygen test. Had discussed since CPAP data showed was not getting enough oxygen. Would like a call back

## 2024-04-30 NOTE — Telephone Encounter (Signed)
 Called pt , Spoke to PT , Informed CMA note , Gave  Pt [facility] Number  [phone]. Pt will follow up with DME

## 2024-05-07 NOTE — Telephone Encounter (Signed)
 Discuss with nurse order for test.  [place]  reviewed and there is an order enter in 03/2024 and CPAP company, [facility] will be calling patient to schedule. Informed patient and gave her the phone number to [facility].

## 2024-05-07 NOTE — Telephone Encounter (Signed)
 Spoke with patient in regards to order for oxygen test. Patient said [facility] do not have an order for oxygent test.  Discussed with the nurse, will check on if there is an order in her chart. Informed the patient and give a her a  call back.

## 2024-05-09 ENCOUNTER — Other Ambulatory Visit: Payer: Self-pay | Admitting: Cardiology

## 2024-05-27 ENCOUNTER — Other Ambulatory Visit: Payer: Self-pay | Admitting: Cardiology

## 2024-05-28 ENCOUNTER — Encounter: Payer: Self-pay | Admitting: Family Medicine

## 2024-05-28 NOTE — Telephone Encounter (Signed)
 Error

## 2024-05-28 NOTE — Telephone Encounter (Signed)
 Patient said [facility] do not have order for oxygen, but [facility] going oxygen test based on the last order of 06/2022. Would like a call back to discuss if order was ever sent.

## 2024-05-29 NOTE — Telephone Encounter (Signed)
 I called and left msg 1st attempt:  If pt returns call state: I do not see an order for continuous oxygen, however; cpap order and pulse oximetry order was sent on 04/06/25. Ask if she did want continous oxygen

## 2025-04-13 ENCOUNTER — Ambulatory Visit: Admitting: Family Medicine
